# Patient Record
Sex: Female | Born: 1935 | Race: White | Hispanic: No | Marital: Single | Smoking: Former smoker
Health system: Southern US, Community
[De-identification: ages and names within clinical notes are randomized; demographics above are authoritative.]

## PROBLEM LIST (undated history)

## (undated) DIAGNOSIS — F32A Depression, unspecified: Secondary | ICD-10-CM

## (undated) DIAGNOSIS — I509 Heart failure, unspecified: Secondary | ICD-10-CM

## (undated) DIAGNOSIS — I10 Essential (primary) hypertension: Secondary | ICD-10-CM

## (undated) DIAGNOSIS — E079 Disorder of thyroid, unspecified: Secondary | ICD-10-CM

## (undated) DIAGNOSIS — D649 Anemia, unspecified: Secondary | ICD-10-CM

## (undated) DIAGNOSIS — E785 Hyperlipidemia, unspecified: Secondary | ICD-10-CM

## (undated) DIAGNOSIS — F329 Major depressive disorder, single episode, unspecified: Secondary | ICD-10-CM

## (undated) HISTORY — DX: Major depressive disorder, single episode, unspecified: F32.9

## (undated) HISTORY — DX: Hyperlipidemia, unspecified: E78.5

## (undated) HISTORY — DX: Heart failure, unspecified: I50.9

## (undated) HISTORY — DX: Depression, unspecified: F32.A

## (undated) HISTORY — DX: Anemia, unspecified: D64.9

## (undated) HISTORY — DX: Disorder of thyroid, unspecified: E07.9

---

## 2004-08-12 ENCOUNTER — Inpatient Hospital Stay (HOSPITAL_COMMUNITY): Admission: AD | Admit: 2004-08-12 | Discharge: 2004-08-20 | Payer: Self-pay | Admitting: *Deleted

## 2004-08-12 ENCOUNTER — Ambulatory Visit: Payer: Self-pay | Admitting: Physical Medicine & Rehabilitation

## 2004-08-20 ENCOUNTER — Inpatient Hospital Stay
Admission: RE | Admit: 2004-08-20 | Discharge: 2004-08-28 | Payer: Self-pay | Admitting: Physical Medicine & Rehabilitation

## 2004-08-31 ENCOUNTER — Inpatient Hospital Stay (HOSPITAL_COMMUNITY): Admission: EM | Admit: 2004-08-31 | Discharge: 2004-09-05 | Payer: Self-pay | Admitting: Emergency Medicine

## 2004-09-18 ENCOUNTER — Encounter: Admission: RE | Admit: 2004-09-18 | Discharge: 2004-09-18 | Payer: Self-pay | Admitting: Cardiothoracic Surgery

## 2004-10-20 ENCOUNTER — Encounter
Admission: RE | Admit: 2004-10-20 | Discharge: 2004-10-20 | Payer: Self-pay | Admitting: Thoracic Surgery (Cardiothoracic Vascular Surgery)

## 2005-01-13 ENCOUNTER — Inpatient Hospital Stay (HOSPITAL_COMMUNITY): Admission: RE | Admit: 2005-01-13 | Discharge: 2005-01-20 | Payer: Self-pay | Admitting: Orthopedic Surgery

## 2005-01-13 ENCOUNTER — Ambulatory Visit: Payer: Self-pay | Admitting: Physical Medicine & Rehabilitation

## 2005-01-20 ENCOUNTER — Inpatient Hospital Stay
Admission: RE | Admit: 2005-01-20 | Discharge: 2005-02-05 | Payer: Self-pay | Admitting: Physical Medicine & Rehabilitation

## 2011-06-28 ENCOUNTER — Ambulatory Visit (INDEPENDENT_AMBULATORY_CARE_PROVIDER_SITE_OTHER): Payer: Medicare Other | Admitting: Family Medicine

## 2011-06-28 ENCOUNTER — Encounter: Payer: Self-pay | Admitting: Family Medicine

## 2011-06-28 VITALS — BP 120/80 | HR 71 | Temp 98.0°F | Resp 20 | Ht 63.0 in | Wt 191.0 lb

## 2011-06-28 DIAGNOSIS — E039 Hypothyroidism, unspecified: Secondary | ICD-10-CM

## 2011-06-28 DIAGNOSIS — I10 Essential (primary) hypertension: Secondary | ICD-10-CM

## 2011-06-28 DIAGNOSIS — F329 Major depressive disorder, single episode, unspecified: Secondary | ICD-10-CM

## 2011-06-28 DIAGNOSIS — Z23 Encounter for immunization: Secondary | ICD-10-CM

## 2011-06-28 DIAGNOSIS — M1990 Unspecified osteoarthritis, unspecified site: Secondary | ICD-10-CM

## 2011-06-28 DIAGNOSIS — E785 Hyperlipidemia, unspecified: Secondary | ICD-10-CM

## 2011-06-28 LAB — LIPID PANEL
Cholesterol: 178 mg/dL (ref 0–200)
HDL: 49 mg/dL (ref >39)
LDL Cholesterol: 103 mg/dL — ABNORMAL HIGH (ref 0–99)
Total CHOL/HDL Ratio: 3.6 Ratio
Triglycerides: 132 mg/dL (ref <150)

## 2011-06-28 LAB — COMPREHENSIVE METABOLIC PANEL
ALT: 10 U/L (ref 0–35)
AST: 15 U/L (ref 0–37)
Albumin: 4.1 g/dL (ref 3.5–5.2)
Alkaline Phosphatase: 83 U/L (ref 39–117)
CO2: 28 mEq/L (ref 19–32)
Calcium: 9.8 mg/dL (ref 8.4–10.5)
Creat: 1.14 mg/dL — ABNORMAL HIGH (ref 0.50–1.10)
Glucose, Bld: 132 mg/dL — ABNORMAL HIGH (ref 70–99)
Potassium: 4.5 mEq/L (ref 3.5–5.3)
Sodium: 139 mEq/L (ref 135–145)
Total Bilirubin: 0.3 mg/dL (ref 0.3–1.2)
Total Protein: 7.6 g/dL (ref 6.0–8.3)

## 2011-06-28 MED ORDER — DULOXETINE HCL 60 MG PO CPEP: 60.0000 mg | ORAL_CAPSULE | Freq: Every day | ORAL | Status: DC

## 2011-06-28 MED ORDER — LISINOPRIL 5 MG PO TABS: 5.0000 mg | ORAL_TABLET | Freq: Every day | ORAL | Status: DC

## 2011-06-28 MED ORDER — LEVOTHYROXINE SODIUM 50 MCG PO TABS: 50.0000 ug | ORAL_TABLET | Freq: Every day | ORAL | Status: DC

## 2011-06-28 MED ORDER — BUPROPION HCL ER (XL) 150 MG PO TB24: 150.0000 mg | ORAL_TABLET | Freq: Every day | ORAL | Status: DC

## 2011-06-28 MED ORDER — METOPROLOL TARTRATE 50 MG PO TABS: 50.0000 mg | ORAL_TABLET | Freq: Two times a day (BID) | ORAL | Status: DC

## 2011-06-28 MED ORDER — SIMVASTATIN 40 MG PO TABS: 40.0000 mg | ORAL_TABLET | Freq: Every evening | ORAL | Status: DC

## 2011-06-28 NOTE — Progress Notes (Signed)
  Subjective:    Patient ID: Jasmine Newman, female    DOB: 11-15-1931, 78 y.o.   MRN: [id number]  HPI  Patient presents with her husband in follow up of her multiple medical problems.  Compliant with medications without side effects. Needing medications refills and BW.  DJD (R) knee;  TKR (L) knee; pain limits ability to ambulate; using walker for balance and stability.  Married; husband assists with IADL's  Patient declines mammogram, colonoscopy and all other health screening tests. Agrees to update vaccinations  Review of Systems     Objective:   Physical Exam  Constitutional: She appears well-developed.  HENT:  Right Ear: External ear normal.  Left Ear: External ear normal.  Nose: Nose normal.  Mouth/Throat: Oropharynx is clear and moist.  Eyes: EOM are normal. Pupils are equal, round, and reactive to light.  Neck: No thyromegaly present.  Cardiovascular: Normal rate, regular rhythm and normal heart sounds.   Pulmonary/Chest: Effort normal and breath sounds normal.  Abdominal: Soft. She exhibits no mass. There is no hepatosplenomegaly. There is no tenderness.  Neurological: She is alert.  Skin: Skin is warm.        Assessment & Plan:   1. Need for vaccination  Tdap vaccine greater than or equal to 7yo IM  2. HTN (hypertension)  Comprehensive metabolic panel, metoprolol (LOPRESSOR) 50 MG tablet, lisinopril (PRINIVIL,ZESTRIL) 5 MG tablet  3. Dyslipidemia  Lipid panel, simvastatin (ZOCOR) 40 MG tablet  4. DJD (degenerative joint disease)    5. Depression  DULoxetine (CYMBALTA) 60 MG capsule, buPROPion (WELLBUTRIN XL) 150 MG 24 hr tablet  6. Hypothyroid  TSH, levothyroxine (SYNTHROID, LEVOTHROID) 50 MCG tablet   Anticipatory guidance

## 2015-06-01 ENCOUNTER — Inpatient Hospital Stay (HOSPITAL_COMMUNITY)
Admission: EM | Admit: 2015-06-01 | Discharge: 2015-06-20 | DRG: 853 | Disposition: A | Discharge status: Skilled Nursing Facility | Payer: Medicare Other | Attending: Internal Medicine | Admitting: Internal Medicine

## 2015-06-01 ENCOUNTER — Emergency Department (HOSPITAL_COMMUNITY): Payer: Medicare Other

## 2015-06-01 ENCOUNTER — Encounter (HOSPITAL_COMMUNITY): Payer: Self-pay

## 2015-06-01 DIAGNOSIS — Z87891 Personal history of nicotine dependence: Secondary | ICD-10-CM

## 2015-06-01 DIAGNOSIS — Z978 Presence of other specified devices: Secondary | ICD-10-CM

## 2015-06-01 DIAGNOSIS — Y92009 Unspecified place in unspecified non-institutional (private) residence as the place of occurrence of the external cause: Secondary | ICD-10-CM

## 2015-06-01 DIAGNOSIS — E440 Moderate protein-calorie malnutrition: Secondary | ICD-10-CM | POA: Diagnosis present

## 2015-06-01 DIAGNOSIS — E860 Dehydration: Secondary | ICD-10-CM

## 2015-06-01 DIAGNOSIS — E669 Obesity, unspecified: Secondary | ICD-10-CM | POA: Diagnosis present

## 2015-06-01 DIAGNOSIS — T826XXA Infection and inflammatory reaction due to cardiac valve prosthesis, initial encounter: Secondary | ICD-10-CM | POA: Diagnosis present

## 2015-06-01 DIAGNOSIS — D62 Acute posthemorrhagic anemia: Secondary | ICD-10-CM | POA: Diagnosis present

## 2015-06-01 DIAGNOSIS — Y831 Surgical operation with implant of artificial internal device as the cause of abnormal reaction of the patient, or of later complication, without mention of misadventure at the time of the procedure: Secondary | ICD-10-CM | POA: Diagnosis present

## 2015-06-01 DIAGNOSIS — E162 Hypoglycemia, unspecified: Secondary | ICD-10-CM | POA: Diagnosis not present

## 2015-06-01 DIAGNOSIS — I339 Acute and subacute endocarditis, unspecified: Secondary | ICD-10-CM

## 2015-06-01 DIAGNOSIS — K922 Gastrointestinal hemorrhage, unspecified: Secondary | ICD-10-CM | POA: Diagnosis present

## 2015-06-01 DIAGNOSIS — E039 Hypothyroidism, unspecified: Secondary | ICD-10-CM | POA: Diagnosis present

## 2015-06-01 DIAGNOSIS — B9689 Other specified bacterial agents as the cause of diseases classified elsewhere: Secondary | ICD-10-CM | POA: Diagnosis not present

## 2015-06-01 DIAGNOSIS — A419 Sepsis, unspecified organism: Secondary | ICD-10-CM | POA: Diagnosis not present

## 2015-06-01 DIAGNOSIS — I080 Rheumatic disorders of both mitral and aortic valves: Secondary | ICD-10-CM | POA: Diagnosis present

## 2015-06-01 DIAGNOSIS — T502X5A Adverse effect of carbonic-anhydrase inhibitors, benzothiadiazides and other diuretics, initial encounter: Secondary | ICD-10-CM | POA: Diagnosis not present

## 2015-06-01 DIAGNOSIS — N390 Urinary tract infection, site not specified: Secondary | ICD-10-CM | POA: Diagnosis present

## 2015-06-01 DIAGNOSIS — K264 Chronic or unspecified duodenal ulcer with hemorrhage: Secondary | ICD-10-CM | POA: Diagnosis present

## 2015-06-01 DIAGNOSIS — K260 Acute duodenal ulcer with hemorrhage: Secondary | ICD-10-CM | POA: Diagnosis not present

## 2015-06-01 DIAGNOSIS — J9601 Acute respiratory failure with hypoxia: Secondary | ICD-10-CM | POA: Diagnosis not present

## 2015-06-01 DIAGNOSIS — I214 Non-ST elevation (NSTEMI) myocardial infarction: Secondary | ICD-10-CM | POA: Diagnosis present

## 2015-06-01 DIAGNOSIS — E876 Hypokalemia: Secondary | ICD-10-CM | POA: Diagnosis present

## 2015-06-01 DIAGNOSIS — K921 Melena: Secondary | ICD-10-CM

## 2015-06-01 DIAGNOSIS — N179 Acute kidney failure, unspecified: Secondary | ICD-10-CM | POA: Diagnosis present

## 2015-06-01 DIAGNOSIS — I5033 Acute on chronic diastolic (congestive) heart failure: Secondary | ICD-10-CM

## 2015-06-01 DIAGNOSIS — A4181 Sepsis due to Enterococcus: Principal | ICD-10-CM | POA: Diagnosis present

## 2015-06-01 DIAGNOSIS — W19XXXA Unspecified fall, initial encounter: Secondary | ICD-10-CM | POA: Insufficient documentation

## 2015-06-01 DIAGNOSIS — I249 Acute ischemic heart disease, unspecified: Secondary | ICD-10-CM | POA: Diagnosis not present

## 2015-06-01 DIAGNOSIS — I4891 Unspecified atrial fibrillation: Secondary | ICD-10-CM | POA: Diagnosis present

## 2015-06-01 DIAGNOSIS — Z66 Do not resuscitate: Secondary | ICD-10-CM | POA: Diagnosis present

## 2015-06-01 DIAGNOSIS — J449 Chronic obstructive pulmonary disease, unspecified: Secondary | ICD-10-CM | POA: Diagnosis present

## 2015-06-01 DIAGNOSIS — E785 Hyperlipidemia, unspecified: Secondary | ICD-10-CM | POA: Diagnosis present

## 2015-06-01 DIAGNOSIS — E870 Hyperosmolality and hypernatremia: Secondary | ICD-10-CM | POA: Diagnosis present

## 2015-06-01 DIAGNOSIS — R0602 Shortness of breath: Secondary | ICD-10-CM

## 2015-06-01 DIAGNOSIS — R9389 Abnormal findings on diagnostic imaging of other specified body structures: Secondary | ICD-10-CM

## 2015-06-01 DIAGNOSIS — Z951 Presence of aortocoronary bypass graft: Secondary | ICD-10-CM

## 2015-06-01 DIAGNOSIS — I110 Hypertensive heart disease with heart failure: Secondary | ICD-10-CM | POA: Diagnosis present

## 2015-06-01 DIAGNOSIS — L89623 Pressure ulcer of left heel, stage 3: Secondary | ICD-10-CM | POA: Diagnosis present

## 2015-06-01 DIAGNOSIS — B952 Enterococcus as the cause of diseases classified elsewhere: Secondary | ICD-10-CM | POA: Diagnosis not present

## 2015-06-01 DIAGNOSIS — R7881 Bacteremia: Secondary | ICD-10-CM

## 2015-06-01 DIAGNOSIS — Z6834 Body mass index (BMI) 34.0-34.9, adult: Secondary | ICD-10-CM | POA: Diagnosis not present

## 2015-06-01 DIAGNOSIS — R579 Shock, unspecified: Secondary | ICD-10-CM | POA: Diagnosis not present

## 2015-06-01 DIAGNOSIS — R571 Hypovolemic shock: Secondary | ICD-10-CM | POA: Insufficient documentation

## 2015-06-01 DIAGNOSIS — I340 Nonrheumatic mitral (valve) insufficiency: Secondary | ICD-10-CM | POA: Diagnosis not present

## 2015-06-01 DIAGNOSIS — Z9889 Other specified postprocedural states: Secondary | ICD-10-CM | POA: Diagnosis not present

## 2015-06-01 DIAGNOSIS — I330 Acute and subacute infective endocarditis: Secondary | ICD-10-CM | POA: Diagnosis present

## 2015-06-01 DIAGNOSIS — L8990 Pressure ulcer of unspecified site, unspecified stage: Secondary | ICD-10-CM | POA: Insufficient documentation

## 2015-06-01 DIAGNOSIS — D696 Thrombocytopenia, unspecified: Secondary | ICD-10-CM | POA: Diagnosis present

## 2015-06-01 DIAGNOSIS — Z952 Presence of prosthetic heart valve: Secondary | ICD-10-CM | POA: Diagnosis not present

## 2015-06-01 DIAGNOSIS — Z9289 Personal history of other medical treatment: Secondary | ICD-10-CM

## 2015-06-01 DIAGNOSIS — D6489 Other specified anemias: Secondary | ICD-10-CM | POA: Diagnosis not present

## 2015-06-01 HISTORY — DX: Essential (primary) hypertension: I10

## 2015-06-01 LAB — GLUCOSE, CAPILLARY
GLUCOSE-CAPILLARY: 88 mg/dL (ref 65–99)
GLUCOSE-CAPILLARY: 112 mg/dL — AB (ref 65–99)

## 2015-06-01 LAB — CBC
HCT: 24.2 % — ABNORMAL LOW (ref 36.0–46.0)
Hemoglobin: 8.2 g/dL — ABNORMAL LOW (ref 12.0–15.0)
MCH: 26.6 pg (ref 26.0–34.0)
MCHC: 33.9 g/dL (ref 30.0–36.0)
MCV: 78.6 fL (ref 78.0–100.0)
Platelets: 206 10*3/uL (ref 150–400)
RBC: 3.08 MIL/uL — ABNORMAL LOW (ref 3.87–5.11)
RDW: 18.5 % — AB (ref 11.5–15.5)
WBC: 10.4 10*3/uL (ref 4.0–10.5)

## 2015-06-01 LAB — BRAIN NATRIURETIC PEPTIDE: B Natriuretic Peptide: 329.7 pg/mL — ABNORMAL HIGH (ref 0.0–100.0)

## 2015-06-01 LAB — COMPREHENSIVE METABOLIC PANEL
ALK PHOS: 64 U/L (ref 38–126)
ALT: 19 U/L (ref 14–54)
AST: 25 U/L (ref 15–41)
Albumin: 2.4 g/dL — ABNORMAL LOW (ref 3.5–5.0)
Anion gap: 9 (ref 5–15)
BUN: 60 mg/dL — ABNORMAL HIGH (ref 6–20)
CALCIUM: 8.4 mg/dL — AB (ref 8.9–10.3)
CO2: 25 mmol/L (ref 22–32)
Chloride: 106 mmol/L (ref 101–111)
Creatinine, Ser: 2.5 mg/dL — ABNORMAL HIGH (ref 0.44–1.00)
GFR calc Af Amer: 20 mL/min — ABNORMAL LOW (ref >60)
GFR calc non Af Amer: 17 mL/min — ABNORMAL LOW (ref >60)
Glucose, Bld: 112 mg/dL — ABNORMAL HIGH (ref 65–99)
Potassium: 4.5 mmol/L (ref 3.5–5.1)
SODIUM: 140 mmol/L (ref 135–145)
Total Bilirubin: 0.5 mg/dL (ref 0.3–1.2)
Total Protein: 6.1 g/dL — ABNORMAL LOW (ref 6.5–8.1)

## 2015-06-01 LAB — AMYLASE: Amylase: 44 U/L (ref 28–100)

## 2015-06-01 LAB — CBC WITH DIFFERENTIAL/PLATELET
Basophils Absolute: 0 10*3/uL (ref 0.0–0.1)
Basophils Relative: 0 %
EOS ABS: 0 10*3/uL (ref 0.0–0.7)
Eosinophils Relative: 0 %
HCT: 23.3 % — ABNORMAL LOW (ref 36.0–46.0)
HEMOGLOBIN: 7.4 g/dL — AB (ref 12.0–15.0)
LYMPHS ABS: 1.3 10*3/uL (ref 0.7–4.0)
Lymphocytes Relative: 12 %
MCH: 24.3 pg — AB (ref 26.0–34.0)
MCHC: 31.8 g/dL (ref 30.0–36.0)
MCV: 76.4 fL — ABNORMAL LOW (ref 78.0–100.0)
Monocytes Absolute: 0.8 10*3/uL (ref 0.1–1.0)
Monocytes Relative: 7 %
NEUTROS PCT: 81 %
Neutro Abs: 9 10*3/uL — ABNORMAL HIGH (ref 1.7–7.7)
Platelets: 215 10*3/uL (ref 150–400)
RBC: 3.05 MIL/uL — AB (ref 3.87–5.11)
RDW: 19.3 % — ABNORMAL HIGH (ref 11.5–15.5)
WBC: 11.1 10*3/uL — AB (ref 4.0–10.5)

## 2015-06-01 LAB — URINALYSIS, ROUTINE W REFLEX MICROSCOPIC
BILIRUBIN URINE: NEGATIVE
Glucose, UA: NEGATIVE mg/dL
KETONES UR: NEGATIVE mg/dL
NITRITE: NEGATIVE
PH: 5 (ref 5.0–8.0)
Protein, ur: 30 mg/dL — AB
Specific Gravity, Urine: 1.016 (ref 1.005–1.030)

## 2015-06-01 LAB — PROCALCITONIN: Procalcitonin: 0.63 ng/mL

## 2015-06-01 LAB — LACTIC ACID, PLASMA: Lactic Acid, Venous: 0.7 mmol/L (ref 0.5–2.0)

## 2015-06-01 LAB — HEMOGLOBIN AND HEMATOCRIT, BLOOD
HCT: 25.6 % — ABNORMAL LOW (ref 36.0–46.0)
Hemoglobin: 8.5 g/dL — ABNORMAL LOW (ref 12.0–15.0)

## 2015-06-01 LAB — I-STAT CG4 LACTIC ACID, ED: LACTIC ACID, VENOUS: 0.87 mmol/L (ref 0.5–2.0)

## 2015-06-01 LAB — I-STAT TROPONIN, ED: TROPONIN I, POC: 0.04 ng/mL (ref 0.00–0.08)

## 2015-06-01 LAB — PROTIME-INR
INR: 1.07 (ref 0.00–1.49)
Prothrombin Time: 14.1 seconds (ref 11.6–15.2)

## 2015-06-01 LAB — TROPONIN I: Troponin I: 0.9 ng/mL (ref <0.031)

## 2015-06-01 LAB — URINE MICROSCOPIC-ADD ON
RBC / HPF: NONE SEEN RBC/hpf (ref 0–5)
Squamous Epithelial / LPF: NONE SEEN

## 2015-06-01 LAB — POC OCCULT BLOOD, ED: Fecal Occult Bld: POSITIVE — AB

## 2015-06-01 LAB — APTT: aPTT: 27 seconds (ref 24–37)

## 2015-06-01 LAB — ABO/RH: ABO/RH(D): O POS

## 2015-06-01 LAB — CK: Total CK: 323 U/L — ABNORMAL HIGH (ref 38–234)

## 2015-06-01 LAB — PREPARE RBC (CROSSMATCH)

## 2015-06-01 LAB — LIPASE, BLOOD: Lipase: 36 U/L (ref 11–51)

## 2015-06-01 LAB — MRSA PCR SCREENING: MRSA by PCR: NEGATIVE

## 2015-06-01 MED ORDER — SODIUM CHLORIDE 0.9 % IV SOLN: 80.0000 mg | Freq: Once | INTRAVENOUS | Status: DC

## 2015-06-01 MED ORDER — PANTOPRAZOLE SODIUM 40 MG IV SOLR
40.0000 mg | Freq: Two times a day (BID) | INTRAVENOUS | Status: DC
  Administered 2015-06-04: 40 mg via INTRAVENOUS
  Filled 2015-06-01 (×2): qty 40

## 2015-06-01 MED ORDER — SODIUM CHLORIDE 0.9 % IV SOLN
8.0000 mg/h | INTRAVENOUS | Status: AC
  Administered 2015-06-01 – 2015-06-04 (×7): 8 mg/h via INTRAVENOUS
  Filled 2015-06-01 (×15): qty 80

## 2015-06-01 MED ORDER — SODIUM CHLORIDE 0.9 % IV SOLN
80.0000 mg | Freq: Once | INTRAVENOUS | Status: AC
  Administered 2015-06-01: 80 mg via INTRAVENOUS
  Filled 2015-06-01: qty 80

## 2015-06-01 MED ORDER — SODIUM CHLORIDE 0.9 % IV BOLUS (SEPSIS)
1000.0000 mL | Freq: Once | INTRAVENOUS | Status: AC
  Administered 2015-06-01: 1000 mL via INTRAVENOUS

## 2015-06-01 MED ORDER — PANTOPRAZOLE SODIUM 40 MG IV SOLR: 40.0000 mg | Freq: Two times a day (BID) | INTRAVENOUS | Status: DC

## 2015-06-01 MED ORDER — SODIUM CHLORIDE 0.9 % IV SOLN
1.5000 g | Freq: Once | INTRAVENOUS | Status: AC
  Administered 2015-06-01: 1.5 g via INTRAVENOUS
  Filled 2015-06-01: qty 1.5

## 2015-06-01 MED ORDER — SODIUM CHLORIDE 0.9 % IV SOLN
10.0000 mL/h | Freq: Once | INTRAVENOUS | Status: AC
  Administered 2015-06-02: 10 mL/h via INTRAVENOUS

## 2015-06-01 MED ORDER — SODIUM CHLORIDE 0.9 % IV SOLN
INTRAVENOUS | Status: DC
  Administered 2015-06-01 – 2015-06-04 (×5): via INTRAVENOUS
  Administered 2015-06-05: 1000 mL via INTRAVENOUS
  Administered 2015-06-05 – 2015-06-07 (×2): via INTRAVENOUS

## 2015-06-01 MED ORDER — FENTANYL CITRATE (PF) 100 MCG/2ML IJ SOLN: 12.5000 ug | INTRAMUSCULAR | Status: DC | PRN

## 2015-06-01 MED ORDER — INSULIN ASPART 100 UNIT/ML ~~LOC~~ SOLN
0.0000 [IU] | SUBCUTANEOUS | Status: DC
  Administered 2015-06-02: 1 [IU] via SUBCUTANEOUS
  Administered 2015-06-02: 2 [IU] via SUBCUTANEOUS
  Administered 2015-06-02 (×2): 1 [IU] via SUBCUTANEOUS

## 2015-06-01 MED ORDER — SODIUM CHLORIDE 0.9 % IV SOLN
1.5000 g | Freq: Two times a day (BID) | INTRAVENOUS | Status: DC
  Administered 2015-06-02: 1.5 g via INTRAVENOUS
  Filled 2015-06-01: qty 1.5

## 2015-06-01 MED ORDER — SODIUM CHLORIDE 0.9 % IV SOLN: 8.0000 mg/h | INTRAVENOUS | Status: DC

## 2015-06-01 MED ORDER — LEVOTHYROXINE SODIUM 100 MCG IV SOLR
25.0000 ug | Freq: Every day | INTRAVENOUS | Status: DC
  Administered 2015-06-01 – 2015-06-12 (×11): 25 ug via INTRAVENOUS
  Filled 2015-06-01 (×14): qty 5

## 2015-06-01 MED ORDER — NOREPINEPHRINE BITARTRATE 1 MG/ML IV SOLN
0.0000 ug/min | INTRAVENOUS | Status: DC
  Administered 2015-06-01: 8 ug/min via INTRAVENOUS
  Administered 2015-06-02: 10 ug/min via INTRAVENOUS
  Filled 2015-06-01 (×2): qty 4

## 2015-06-01 MED ORDER — SODIUM CHLORIDE 0.9 % IV SOLN
250.0000 mL | INTRAVENOUS | Status: DC | PRN
  Administered 2015-06-08: 250 mL via INTRAVENOUS

## 2015-06-01 NOTE — ED Notes (Signed)
Pt presents via EMS post fall last PM. Apparently witness by husband but nothing done to get pt out of floor. EMS reports pt laying in coffee ground stool and melena. EMS reported pt to be hypotensive. BP low upon assessment. Pt appears to be pale. Reports pain to bilateral legs.

## 2015-06-01 NOTE — Procedures (Signed)
Central Venous Catheter Insertion Procedure Note Jasmine Newman [id number] April 05, 1931  Procedure: Insertion of Central Venous Catheter Indications: Drug and/or fluid administration  Procedure Details Consent: Risks of procedure as well as the alternatives and risks of each were explained to the (patient/caregiver).  Consent for procedure obtained. Time Out: Verified patient identification, verified procedure, site/side was marked, verified correct patient position, special equipment/implants available, medications/allergies/relevent history reviewed, required imaging and test results available.  Performed  Maximum sterile technique was used including antiseptics, cap, gloves, gown, hand hygiene, mask and sheet. Skin prep: Chlorhexidine; local anesthetic administered A antimicrobial bonded/coated triple lumen catheter was placed in the right internal jugular vein using the Seldinger technique.  Evaluation Blood flow good Complications: No apparent complications Patient did tolerate procedure well. Chest X-ray ordered to verify placement.  CXR: pending.  Kendan Cornforth 06/01/2015, 3:42 PM

## 2015-06-01 NOTE — ED Provider Notes (Signed)
CSN: [id number]     Arrival date & time 06/01/15  1006 History   First MD Initiated Contact with Patient 06/01/15 1032     Chief Complaint  Patient presents with  . Hypotension  . Fall     (Consider location/radiation/quality/duration/timing/severity/associated sxs/prior Treatment) The history is provided by the patient and medical records.   82 year old female with history of hypothyroidism, hypertension, hyperlipidemia, depression, presenting to the ED after a fall. Patient reports last night she is watching a basketball game with her husband, attempted to stand up from her chair to go use the bathroom but her legs "gave out" and she fell onto her right side on carpeted floor.  Denies head injury or loss of consciousness.  She states her husband left her lying in the floor all night, and her son came to help her up this morning and called EMS. Paramedics report patient was found in a large puddle of melanotic stool.  Patient does admit she has had some dark recently. She denies any history of GI bleeding. She is not currently on any anticoagulation. Patient was hypotensive on EMS arrival. EMS also reports that patient has been refusing to go to the doctor so husband has been giving her his medications.  Patient is not entirely sure exactly what medications she has been taking or what her husbands medications are.  She denies current chest pain, SOB, abdominal pain, nausea, or vomiting.  No recent fever or chills.  Patient persistently hypotensive on arrival to ED.  No past medical history on file. No past surgical history on file. No family history on file. Social History  Substance Use Topics  . Smoking status: Former Games developermoker  . Smokeless tobacco: Not on file  . Alcohol Use: Not on file   OB History    No data available     Review of Systems  Gastrointestinal: Positive for blood in stool.  Musculoskeletal: Positive for arthralgias.  All other systems reviewed and are  negative.     Allergies  Review of patient's allergies indicates not on file.  Home Medications   Prior to Admission medications   Medication Sig Start Date End Date Taking? Authorizing Provider  buPROPion (WELLBUTRIN XL) 150 MG 24 hr tablet Take 1 tablet (150 mg total) by mouth daily. 06/28/11   Dois DavenportKaren L Richter, MD  DULoxetine (CYMBALTA) 60 MG capsule Take 1 capsule (60 mg total) by mouth daily. 06/28/11   Dois DavenportKaren L Richter, MD  levothyroxine (SYNTHROID, LEVOTHROID) 50 MCG tablet Take 1 tablet (50 mcg total) by mouth daily. 06/28/11   Dois DavenportKaren L Richter, MD  lisinopril (PRINIVIL,ZESTRIL) 5 MG tablet Take 1 tablet (5 mg total) by mouth daily. 06/28/11   Dois DavenportKaren L Richter, MD  metoprolol (LOPRESSOR) 50 MG tablet Take 1 tablet (50 mg total) by mouth 2 (two) times daily. 06/28/11   Dois DavenportKaren L Richter, MD  simvastatin (ZOCOR) 40 MG tablet Take 1 tablet (40 mg total) by mouth every evening. 06/28/11   Dois DavenportKaren L Richter, MD   BP 77/52 mmHg  Pulse 78  Temp(Src) 97.6 F (36.4 C) (Axillary)  Resp 16  Ht 5\' 2"  (1.575 m)  Wt 86.183 kg  BMI 34.74 kg/m2  SpO2 98%  LMP  (LMP Unknown)   Physical Exam  Constitutional: She is oriented to person, place, and time. She appears well-developed and well-nourished.  Appears pale, weak  HENT:  Head: Normocephalic and atraumatic.  Mouth/Throat: Oropharynx is clear and moist.  No signs of head trauma  Eyes: Conjunctivae  and EOM are normal. Pupils are equal, round, and reactive to light.  Conjunctiva pale, pupils symmetric and reactive  Neck: Normal range of motion.  Cardiovascular: Normal rate, regular rhythm and normal heart sounds.   Pulmonary/Chest: Effort normal and breath sounds normal. She has no wheezes. She has no rhonchi.  Bruising noted to right anterior and lateral ribs; locally tender without significant deformity; no crepitus; no signs of distress  Abdominal: Soft. Bowel sounds are normal.  Genitourinary:  Gross, loose melanotic stool per rectum   Musculoskeletal: Normal range of motion.  Right hip minimally tender, no significant leg shortening appreciated; legs with poor muscle tone bilaterally (baseline); DP pulses intact; able to move all toes normally  Neurological: She is alert and oriented to person, place, and time.  Awake, alert, answering questions and following commands appropriately; moving all extremities well Gait not tested  Skin: Skin is warm and dry.  Psychiatric: She has a normal mood and affect.  Nursing note and vitals reviewed.   ED Course  Procedures (including critical care time)  CRITICAL CARE Performed by: Garlon Hatchet   Total critical care time: 75 minutes  Critical care time was exclusive of separately billable procedures and treating other patients.  Critical care was necessary to treat or prevent imminent or life-threatening deterioration.  Critical care was time spent personally by me on the following activities: development of treatment plan with patient and/or surrogate as well as nursing, discussions with consultants, evaluation of patient's response to treatment, examination of patient, obtaining history from patient or surrogate, ordering and performing treatments and interventions, ordering and review of laboratory studies, ordering and review of radiographic studies, pulse oximetry and re-evaluation of patient's condition.  Medications  pantoprazole (PROTONIX) 80 mg in sodium chloride 0.9 % 250 mL (0.32 mg/mL) infusion (8 mg/hr Intravenous New Bag/Given 06/01/15 1232)  pantoprazole (PROTONIX) injection 40 mg (not administered)  0.9 %  sodium chloride infusion (not administered)  0.9 %  sodium chloride infusion (not administered)  sodium chloride 0.9 % bolus 1,000 mL (1,000 mLs Intravenous New Bag/Given 06/01/15 1610)  0.9 %  sodium chloride infusion (not administered)  levothyroxine (SYNTHROID, LEVOTHROID) injection 25 mcg (not administered)  insulin aspart (novoLOG) injection 0-9 Units  (not administered)  ampicillin-sulbactam (UNASYN) 1.5 g in sodium chloride 0.9 % 50 mL IVPB (not administered)  ampicillin-sulbactam (UNASYN) 1.5 g in sodium chloride 0.9 % 50 mL IVPB (not administered)  sodium chloride 0.9 % bolus 1,000 mL (0 mLs Intravenous Stopped 06/01/15 1230)  pantoprazole (PROTONIX) 80 mg in sodium chloride 0.9 % 100 mL IVPB (0 mg Intravenous Stopped 06/01/15 1231)  sodium chloride 0.9 % bolus 1,000 mL (0 mLs Intravenous Stopped 06/01/15 1345)   Labs Review Labs Reviewed  CBC WITH DIFFERENTIAL/PLATELET - Abnormal; Notable for the following:    WBC 11.1 (*)    RBC 3.05 (*)    Hemoglobin 7.4 (*)    HCT 23.3 (*)    MCV 76.4 (*)    MCH 24.3 (*)    RDW 19.3 (*)    Neutro Abs 9.0 (*)    All other components within normal limits  COMPREHENSIVE METABOLIC PANEL - Abnormal; Notable for the following:    Glucose, Bld 112 (*)    BUN 60 (*)    Creatinine, Ser 2.50 (*)    Calcium 8.4 (*)    Total Protein 6.1 (*)    Albumin 2.4 (*)    GFR calc non Af Amer 17 (*)    GFR calc  Af Amer 20 (*)    All other components within normal limits  CK - Abnormal; Notable for the following:    Total CK 323 (*)    All other components within normal limits  URINALYSIS, ROUTINE W REFLEX MICROSCOPIC (NOT AT [facility]) - Abnormal; Notable for the following:    APPearance CLOUDY (*)    Hgb urine dipstick MODERATE (*)    Protein, ur 30 (*)    Leukocytes, UA TRACE (*)    All other components within normal limits  URINE MICROSCOPIC-ADD ON - Abnormal; Notable for the following:    Bacteria, UA RARE (*)    Casts HYALINE CASTS (*)    All other components within normal limits  POC OCCULT BLOOD, ED - Abnormal; Notable for the following:    Fecal Occult Bld POSITIVE (*)    All other components within normal limits  URINE CULTURE  PROTIME-INR  APTT  OCCULT BLOOD X 1 CARD TO LAB, STOOL  I-STAT TROPOININ, ED  TYPE AND SCREEN  PREPARE RBC (CROSSMATCH)    Imaging Review Dg Ribs Unilateral W/chest  Right  06/01/2015  CLINICAL DATA:  Right rib pain after fall last night at home. EXAM: RIGHT RIBS AND CHEST - 3+ VIEW COMPARISON:  January 08, 2005. FINDINGS: No fracture or other bone lesions are seen involving the ribs. There is no evidence of pneumothorax or pleural effusion. Both lungs are clear. Heart size and mediastinal contours are within normal limits. IMPRESSION: Normal right ribs.  No acute cardiopulmonary abnormality seen. Electronically Signed   By: Lupita Raider, M.D.   On: 06/01/2015 11:55   Dg Hip Unilat With Pelvis 2-3 Views Right  06/01/2015  CLINICAL DATA:  Fall last night, generalized pain. EXAM: DG HIP (WITH OR WITHOUT PELVIS) 2-3V RIGHT COMPARISON:  None. FINDINGS: Single view of the pelvis and two views of the right hip are provided. There is diffuse osseous demineralization/osteopenia which limits characterization of osseous detail, however, there is no fracture line or displaced fracture fragment identified. Right femoral head appears well positioned relative to the acetabulum. Degenerative changes are seen at each hip joint, at least moderate in degree, left slightly greater than right, with associated joint space narrowing and osseous spurring. Additional degenerative change noted within the lower lumbar spine. Soft tissues about the pelvis and right hip are unremarkable. IMPRESSION: 1. No acute findings.  No osseous fracture or dislocation seen. 2. Diffuse osseous demineralization/osteopenia. 3. Degenerative changes at the bilateral hip joints, at least moderate in degree, left slightly greater than right. Electronically Signed   By: Bary Richard M.D.   On: 06/01/2015 11:52   I have personally reviewed and evaluated these images and lab results as part of my medical decision-making.   EKG Interpretation   Date/Time:  Sunday June 01 2015 11:20:29 EDT Ventricular Rate:  77 PR Interval:  155 QRS Duration: 123 QT Interval:  450 QTC Calculation: 509 R Axis:   -24 Text  Interpretation:  Sinus rhythm Left bundle branch block Confirmed by  BELFI  MD, MELANIE ([id number]) on 06/01/2015 11:25:52 AM      MDM   Final diagnoses:  Fall, initial encounter  Gastrointestinal hemorrhage with melena  Anemia due to acute blood loss  AKI (acute kidney injury) [facility])  Dehydration   82 year old female here after fall onto her right side. Follows witnessed by her husband last night, was left lying in the floor all night. EMS called by son this morning. Patient found in a pool of melanotic  stool. Patient is pale and hypotensive on arrival here. She is awake, alert, oriented. She is able to answer questions and follow commands appropriately. Patient has gross loose melanotic stool per rectum on exam.  Work-up pending including EKG, labs (CBC, CMP, trop, coags, type and screen, u/a with culture), x-rays.  IVF and protonix drip started.  Will need admission.  Patient remains hypotensive, mildly improved into the 80s systolic. Tolerating protonix drip well.  She remains awake, alert, and oriented. She continues to deny any abdominal pain. Her hemoglobin is low at 7.4, unknown baseline. No documented history of anemia. Denies seeing GI in the past, never had a colonoscopy.  No family is available at bedside currently. Patient does not sure what she would like her code status to be. She will accept blood products at this time so 2 units PRBCs have been ordered for her. I have spoken with GI, Dr. Dulce Sellar, will evaluate and provide consultation.  Critical care consulted for admission given her persistent hypotension despite aggressive fluid resuscitation.  Critical care has evaluated patient, will admit for further management.  Garlon Hatchet, PA-C 06/01/15 1629  Rolan Bucco, MD 06/07/15 0700

## 2015-06-01 NOTE — H&P (Signed)
PULMONARY / CRITICAL CARE MEDICINE   Name: Jasmine PonsBetty G Korb MRN: [id number] DOB: Aug 30, 1931    ADMISSION DATE:  06/01/2015 CONSULTATION DATE:  06/01/15  REFERRING MD:  ED  CHIEF COMPLAINT:  GI bleed, hypotension  HISTORY OF PRESENT ILLNESS:   82 year old with past mental history of hypothyroidism, hypertension, hyperlipidemia, depression. Admitted today to the ED after she had a fall at home. [facility] her [facility]. When she stood up to use the bathroom her legs gave out and she fell on the right side. She hurt her leg and chest. She did not hit her head and there was no loss of consciousness. She on the floor the whole night. When her son arrived next morning she was found to be lying in a large pool of melanotic stool. In the ED she was found to be hypertensive with hemoglobin of 7.4 (baseline unknown). She continued to be hypotensive in spite of 2 L of fluid. GI evaluated the patient. PCCM called for admission.  PAST MEDICAL HISTORY :  She  has a past medical history of Hypertension.  PAST SURGICAL HISTORY: She  has no past surgical history on file.  No Known Allergies  No current facility-administered medications on file prior to encounter.   Current Outpatient Prescriptions on File Prior to Encounter  Medication Sig  . buPROPion (WELLBUTRIN XL) 150 MG 24 hr tablet Take 1 tablet (150 mg total) by mouth daily. (Patient not taking: Reported on 06/01/2015)  . DULoxetine (CYMBALTA) 60 MG capsule Take 1 capsule (60 mg total) by mouth daily. (Patient not taking: Reported on 06/01/2015)  . levothyroxine (SYNTHROID, LEVOTHROID) 50 MCG tablet Take 1 tablet (50 mcg total) by mouth daily. (Patient not taking: Reported on 06/01/2015)  . lisinopril (PRINIVIL,ZESTRIL) 5 MG tablet Take 1 tablet (5 mg total) by mouth daily. (Patient not taking: Reported on 06/01/2015)  . metoprolol (LOPRESSOR) 50 MG tablet Take 1 tablet (50 mg total) by mouth 2 (two) times  daily. (Patient not taking: Reported on 06/01/2015)  . simvastatin (ZOCOR) 40 MG tablet Take 1 tablet (40 mg total) by mouth every evening. (Patient not taking: Reported on 06/01/2015)    FAMILY HISTORY:  Her has no family status information on file.   SOCIAL HISTORY: She  reports that she has quit smoking. She does not have any smokeless tobacco history on file.  REVIEW OF SYSTEMS:   Has right rib pain, knee pain. Denies any chest pain, palpitation. Denies any dyspnea, wheezing, cough, sputum. Denies any fevers, chills. No nausea, vomiting, diarrhea, constipation. Admits to having dark colored stools for the past several days. All other review of systems negative  SUBJECTIVE:   VITAL SIGNS: BP 81/52 mmHg  Pulse 119  Temp(Src) 95.8 F (35.4 C) (Rectal)  Resp 15  Ht 5\' 2"  (1.575 m)  Wt 190 lb (86.183 kg)  BMI 34.74 kg/m2  SpO2 97%  LMP  (LMP Unknown)  HEMODYNAMICS:    VENTILATOR SETTINGS:    INTAKE / OUTPUT:    PHYSICAL EXAMINATION: General:  Elderly woman in mild distress. Moaning in pain Neuro: Awake and alert, oriented, moves all 4 extremities. No gross focal deficits HEENT:  No JVD, thyromegaly Cardiovascular:  Tachycardia, regular. No murmurs rubs gallops Lungs:  Clear, no wheeze, crackles Abdomen:  Mild epigastric tenderness. No guarding, rigidity. Positive bowel sounds Musculoskeletal:  Intact tone and bulk Skin:  Bruises over the right knee  LABS:  BMET  Recent Labs Lab 06/01/15 1125  NA  140  K 4.5  CL 106  CO2 25  BUN 60*  CREATININE 2.50*  GLUCOSE 112*    Electrolytes  Recent Labs Lab 06/01/15 1125  CALCIUM 8.4*    CBC  Recent Labs Lab 06/01/15 1125  WBC 11.1*  HGB 7.4*  HCT 23.3*  PLT 215    Coag's  Recent Labs Lab 06/01/15 1125  APTT 27  INR 1.07    Sepsis Markers No results for input(s): LATICACIDVEN, PROCALCITON, O2SATVEN in the last 168 hours.  ABG No results for input(s): PHART, PCO2ART, PO2ART in the last  168 hours.  Liver Enzymes  Recent Labs Lab 06/01/15 1125  AST 25  ALT 19  ALKPHOS 64  BILITOT 0.5  ALBUMIN 2.4*    Cardiac Enzymes No results for input(s): TROPONINI, PROBNP in the last 168 hours.  Glucose No results for input(s): GLUCAP in the last 168 hours.  Imaging Dg Ribs Unilateral W/chest Right  06/01/2015  CLINICAL DATA:  Right rib pain after fall last night at home. EXAM: RIGHT RIBS AND CHEST - 3+ VIEW COMPARISON:  January 08, 2005. FINDINGS: No fracture or other bone lesions are seen involving the ribs. There is no evidence of pneumothorax or pleural effusion. Both lungs are clear. Heart size and mediastinal contours are within normal limits. IMPRESSION: Normal right ribs.  No acute cardiopulmonary abnormality seen. Electronically Signed   By: Lupita Raider, M.D.   On: 06/01/2015 11:55   Dg Hip Unilat With Pelvis 2-3 Views Right  06/01/2015  CLINICAL DATA:  Fall last night, generalized pain. EXAM: DG HIP (WITH OR WITHOUT PELVIS) 2-3V RIGHT COMPARISON:  None. FINDINGS: Single view of the pelvis and two views of the right hip are provided. There is diffuse osseous demineralization/osteopenia which limits characterization of osseous detail, however, there is no fracture line or displaced fracture fragment identified. Right femoral head appears well positioned relative to the acetabulum. Degenerative changes are seen at each hip joint, at least moderate in degree, left slightly greater than right, with associated joint space narrowing and osseous spurring. Additional degenerative change noted within the lower lumbar spine. Soft tissues about the pelvis and right hip are unremarkable. IMPRESSION: 1. No acute findings.  No osseous fracture or dislocation seen. 2. Diffuse osseous demineralization/osteopenia. 3. Degenerative changes at the bilateral hip joints, at least moderate in degree, left slightly greater than right. Electronically Signed   By: Bary Richard M.D.   On: 06/01/2015  11:52    STUDIES:  X ray rib 4/2 > No fractures CXR 4/2 >  CULTURES: Bcx > 4/2  ANTIBIOTICS: Unasyn 4/2 >  SIGNIFICANT EVENTS:  LINES/TUBES: Rt IJ 4/2 >  DISCUSSION: A 82 year old presenting with fall, hypertension, acute GI bleed. She probably has an upper GI source.   Also noted to be in AKI. Have concern for sepsis as she has hypotension and hypothermia. But source is unclear.   ASSESSMENT / PLAN:  PULMONARY A: Stable P:   Supplemental O2 Monitor for deterioration in ICU  CARDIOVASCULAR A:  Hypotension- Likely from GI bleed P:  Check lactic A, Troponins, BNP Hold outpt HTN meds  RENAL A:   AKI- prerenal vs ATN P:   Monitor urine output after hydration  GASTROINTESTINAL A:   Acute GIB- Likely upper source. Already evaluated by GI. May get EGD tomorrow. P:   Follow H/H after 2 units blood. Transfuse for Hb < 7  HEMATOLOGIC A:   Acute blood loss anemia P:  Transfuse and monitor  INFECTIOUS  A:   Concern for sepsis of unclear source P:   Warming blanket. Pan culture Start empiric unasyn Check procalcitonin  ENDOCRINE A:   Hypothyroidism P:   Start IV synthyroid SSI Check TSH, cortisol  NEUROLOGIC A:   Stable Pain in knee P:   Fentanyl PRN for pain Knee x ray to r/o fracture.  FAMILY  - Updates: Pt updated at bedside. She wants to be full code. - Inter-disciplinary family meet or Palliative Care meeting due by:  4/9  Chilton Greathouse MD [facility] Pager [phone] If no answer or after 3pm call: [phone] 06/01/2015, 3:32 PM

## 2015-06-01 NOTE — ED Notes (Signed)
Lab bedside for remainder of blood draw.  Prior RN drew 10mL of blood only.  This RN unable to draw off of existing IV lines, then Pt transported to XR.  This caused delay in complete blood draw.

## 2015-06-01 NOTE — Progress Notes (Signed)
Patient has edema of lower legs bilaterally with dry skin and skin with cracks present.  Patient is very painful when legs are touched.  Unable to apply SCD's to legs due to pain and swelling.  CCM doctor aware of SCD's not applied.  Legs elevated on pillows bilaterally.  Continue to monitor patient closely.  Jasmine Newman Debroah LoopArnold RN

## 2015-06-01 NOTE — ED Notes (Signed)
[employer] initiated by [employer]

## 2015-06-01 NOTE — Consult Note (Signed)
[facility] Consultation Note  Referring Provider: [facility] Primary Care Physician:  No primary care provider on file.  Reason for Consultation:  Blood in stool  HPI: Jasmine Newman is a 82 y.o. female whom we've been asked to see for evaluation of blood in stool.  She passed out yesterday, hurt her legs, xrays negative, and was found to have low Hgb.  She has noticed some black stool yesterday.  She has no abdominal pain.  Denies hematemesis or hematochezia.  Denies NSAIDs.  Denies prior GI bleeding.  Doesn't recall whether she's ever had endoscopy or colonoscopy.  She tires of my questions, "I've had too much done to me already."   Past Medical History  Diagnosis Date  . Hypertension     History reviewed. No pertinent past surgical history.  Prior to Admission medications   Medication Sig Start Date End Date Taking? Authorizing Provider  buPROPion (WELLBUTRIN XL) 150 MG 24 hr tablet Take 1 tablet (150 mg total) by mouth daily. Patient not taking: Reported on 06/01/2015 06/28/11   Dois Davenport, MD  DULoxetine (CYMBALTA) 60 MG capsule Take 1 capsule (60 mg total) by mouth daily. Patient not taking: Reported on 06/01/2015 06/28/11   Dois Davenport, MD  levothyroxine (SYNTHROID, LEVOTHROID) 50 MCG tablet Take 1 tablet (50 mcg total) by mouth daily. Patient not taking: Reported on 06/01/2015 06/28/11   Dois Davenport, MD  lisinopril (PRINIVIL,ZESTRIL) 5 MG tablet Take 1 tablet (5 mg total) by mouth daily. Patient not taking: Reported on 06/01/2015 06/28/11   Dois Davenport, MD  metoprolol (LOPRESSOR) 50 MG tablet Take 1 tablet (50 mg total) by mouth 2 (two) times daily. Patient not taking: Reported on 06/01/2015 06/28/11   Dois Davenport, MD  simvastatin (ZOCOR) 40 MG tablet Take 1 tablet (40 mg total) by mouth every evening. Patient not taking: Reported on 06/01/2015 06/28/11   Dois Davenport, MD    Current Facility-Administered Medications  Medication Dose Route  Frequency Provider Last Rate Last Dose  . 0.9 %  sodium chloride infusion  10 mL/hr Intravenous Once Garlon Hatchet, PA-C      . pantoprazole (PROTONIX) 80 mg in sodium chloride 0.9 % 250 mL (0.32 mg/mL) infusion  8 mg/hr Intravenous Continuous Garlon Hatchet, PA-C 25 mL/hr at 06/01/15 1232 8 mg/hr at 06/01/15 1232  . [START ON 06/04/2015] pantoprazole (PROTONIX) injection 40 mg  40 mg Intravenous Q12H Garlon Hatchet, PA-C       Current Outpatient Prescriptions  Medication Sig Dispense Refill  . buPROPion (WELLBUTRIN XL) 150 MG 24 hr tablet Take 1 tablet (150 mg total) by mouth daily. (Patient not taking: Reported on 06/01/2015) 90 tablet 2  . DULoxetine (CYMBALTA) 60 MG capsule Take 1 capsule (60 mg total) by mouth daily. (Patient not taking: Reported on 06/01/2015) 90 capsule 2  . levothyroxine (SYNTHROID, LEVOTHROID) 50 MCG tablet Take 1 tablet (50 mcg total) by mouth daily. (Patient not taking: Reported on 06/01/2015) 90 tablet 2  . lisinopril (PRINIVIL,ZESTRIL) 5 MG tablet Take 1 tablet (5 mg total) by mouth daily. (Patient not taking: Reported on 06/01/2015) 90 tablet 2  . metoprolol (LOPRESSOR) 50 MG tablet Take 1 tablet (50 mg total) by mouth 2 (two) times daily. (Patient not taking: Reported on 06/01/2015) 180 tablet 2  . simvastatin (ZOCOR) 40 MG tablet Take 1 tablet (40 mg total) by mouth every evening. (Patient not taking: Reported on 06/01/2015) 90 tablet 2    Allergies as  of 06/01/2015  . (No Known Allergies)    History reviewed. No pertinent family history.  Social History   Social History  . Marital Status: Single    Spouse Name: N/A  . Number of Children: N/A  . Years of Education: N/A   Occupational History  . Not on file.   Social History Main Topics  . Smoking status: Former Games developer  . Smokeless tobacco: Not on file  . Alcohol Use: Not on file  . Drug Use: Not on file  . Sexual Activity: Not on file   Other Topics Concern  . Not on file   Social History Narrative     Review of Systems: Positive = bold Gen: Denies any fever, chills, rigors, night sweats, anorexia, fatigue, weakness, malaise, involuntary weight loss, and sleep disorder CV: Denies chest pain, angina, palpitations, syncope, orthopnea, PND, peripheral edema, and claudication. Resp: Denies dyspnea, cough, sputum, wheezing, coughing up blood. GI: Described in detail in HPI.    GU : Denies urinary burning, blood in urine, urinary frequency, urinary hesitancy, nocturnal urination, and urinary incontinence. MS: Denies joint pain or swelling.  Denies muscle weakness, cramps, atrophy.  Derm: Denies rash, itching, oral ulcerations, hives, unhealing ulcers.  Psych: Denies depression, anxiety, memory loss, suicidal ideation, hallucinations,  and confusion. Heme: Denies bruising, bleeding, and enlarged lymph nodes. Neuro:  Denies any headaches, dizziness, paresthesias. Endo:  Denies any problems with DM, thyroid, adrenal function.  Physical Exam: Vital signs in last 24 hours: Temp:  [97.6 F (36.4 C)] 97.6 F (36.4 C) (04/02 1046) Pulse Rate:  [35-98] 98 (04/02 1300) Resp:  [14-16] 16 (04/02 1300) BP: (77-83)/(39-54) 83/53 mmHg (04/02 1300) SpO2:  [78 %-100 %] 100 % (04/02 1300) Weight:  [86.183 kg (190 lb)] 86.183 kg (190 lb) (04/02 1046)   General:   Alert, obese, cantakerous, NAD Head:  Normocephalic and atraumatic. Eyes:  Sclera clear, no icterus.   Conjunctiva pink. Ears:  Normal auditory acuity. Nose:  No deformity, discharge,  or lesions. Mouth:  No deformity or lesions.  Oropharynx pink & moist. Neck:  Supple; no masses or thyromegaly. Lungs:  Clear throughout to auscultation.   No wheezes, crackles, or rhonchi. No acute distress. Heart:  Regular rate and rhythm; no murmurs, clicks, rubs,  or gallops. Abdomen:  Soft, protuberant, nontender and nondistended. No masses, hepatosplenomegaly or hernias noted. Normal bowel sounds, without guarding, and without rebound.     Pulses:   Normal pulses noted. Extremities:  Without clubbing or edema. Neurologic:  Alert and  oriented x4; diffusely weak, lower extremity pains Psych:  Alert and cooperative. Normal mood and affect.   Lab Results:  Recent Labs  06/01/15 1125  WBC 11.1*  HGB 7.4*  HCT 23.3*  PLT 215   BMET  Recent Labs  06/01/15 1125  NA 140  K 4.5  CL 106  CO2 25  GLUCOSE 112*  BUN 60*  CREATININE 2.50*  CALCIUM 8.4*   LFT  Recent Labs  06/01/15 1125  PROT 6.1*  ALBUMIN 2.4*  AST 25  ALT 19  ALKPHOS 64  BILITOT 0.5   PT/INR  Recent Labs  06/01/15 1125  LABPROT 14.1  INR 1.07    Studies/Results: Dg Ribs Unilateral W/chest Right  06/01/2015  CLINICAL DATA:  Right rib pain after fall last night at home. EXAM: RIGHT RIBS AND CHEST - 3+ VIEW COMPARISON:  January 08, 2005. FINDINGS: No fracture or other bone lesions are seen involving the ribs. There is no evidence of  pneumothorax or pleural effusion. Both lungs are clear. Heart size and mediastinal contours are within normal limits. IMPRESSION: Normal right ribs.  No acute cardiopulmonary abnormality seen. Electronically Signed   By: Lupita RaiderJames  Green Jr, M.D.   On: 06/01/2015 11:55   Dg Hip Unilat With Pelvis 2-3 Views Right  06/01/2015  CLINICAL DATA:  Fall last night, generalized pain. EXAM: DG HIP (WITH OR WITHOUT PELVIS) 2-3V RIGHT COMPARISON:  None. FINDINGS: Single view of the pelvis and two views of the right hip are provided. There is diffuse osseous demineralization/osteopenia which limits characterization of osseous detail, however, there is no fracture line or displaced fracture fragment identified. Right femoral head appears well positioned relative to the acetabulum. Degenerative changes are seen at each hip joint, at least moderate in degree, left slightly greater than right, with associated joint space narrowing and osseous spurring. Additional degenerative change noted within the lower lumbar spine. Soft tissues about the pelvis  and right hip are unremarkable. IMPRESSION: 1. No acute findings.  No osseous fracture or dislocation seen. 2. Diffuse osseous demineralization/osteopenia. 3. Degenerative changes at the bilateral hip joints, at least moderate in degree, left slightly greater than right. Electronically Signed   By: Bary RichardStan  Maynard M.D.   On: 06/01/2015 11:52    Impression:  1.  Musculoskeletal pains after fall.  No fractures on xray. 2.  Melena. 3.  Acute blood loss anemia.  Plan:  1.  IV fluids. 2.  Blood transfusion. 3.  PPI. 4.  NPO 5.  I have advised endoscopy.  Patient adamantly refuses, despite my encouragement to the contrary.   6.  [facility] will revisit tomorrow, at which time hopefully she changes her mind about endoscopy.     Freddy JakschOUTLAW,Alecsander Hattabaugh M  06/01/2015, 1:29 PM  Pager [phone] If no answer or after 5 PM call [phone]

## 2015-06-01 NOTE — Progress Notes (Signed)
eLink Physician-Brief Progress Note Patient Name: Jasmine Newman DOB: 07-09-1931 MRN: [id number]   Date of Service  06/01/2015  HPI/Events of Note  Patient admitted with GI bleed.  Now with Hypotension.  No signs of active bleeding presently and in NAD.  Received 3 liters if IVF and getting 2 units prbc.  eICU Interventions  Start levophed Increase rate of transfusion Stat cbc when blood complete     Intervention Category Major Interventions: Hypotension - evaluation and management  Henry RusselSMITH, Tilley Faeth, P 06/01/2015, 6:13 PM

## 2015-06-01 NOTE — ED Notes (Signed)
Patient transported to X-ray 

## 2015-06-01 NOTE — ED Notes (Signed)
Pt O2 level showed 78% due to monitor slipping off Pt finger.  Actual O2 sat at 1201 is 97% on RA

## 2015-06-01 NOTE — Progress Notes (Signed)
Pharmacy Antibiotic Note  Jasmine Newman is a 82 y.o. female admitted on 06/01/2015 with GI bleed.  Pharmacy has been consulted for Unasyn dosing for rule out sepsis.  Plan:  Unasyn 1.5g IV q12h for CrCl 10-30 ml/min Follow up renal function & cultures De-escalate or stop abx as warranted (procalcitonin ordered)  Height: 5\' 2"  (157.5 cm) Weight: 190 lb (86.183 kg) IBW/kg (Calculated) : 50.1  Temp (24hrs), Avg:96.2 F (35.7 C), Min:95.3 F (35.2 C), Max:97.6 F (36.4 C)   Recent Labs Lab 06/01/15 1125 06/01/15 1529  WBC 11.1*  --   CREATININE 2.50*  --   LATICACIDVEN  --  0.87    Estimated Creatinine Clearance: 18 mL/min (by C-G formula based on Cr of 2.5).    No Known Allergies  Antimicrobials this admission: 4/2 >> Unasyn >>  Dose adjustments this admission: n/a  Microbiology results: 4/2 BCx: sent 4/2 UCx: sent  4/2 Sputum: ordered  Thank you for allowing pharmacy to be a part of this patient's care.  Loralee PacasErin Rodrigues Urbanek, PharmD, BCPS Pager: [phone] 06/01/2015 4:07 PM

## 2015-06-02 ENCOUNTER — Other Ambulatory Visit (HOSPITAL_COMMUNITY): Payer: Medicare Other

## 2015-06-02 ENCOUNTER — Encounter (HOSPITAL_COMMUNITY)
Admission: EM | Disposition: A | Discharge status: Skilled Nursing Facility | Payer: Self-pay | Source: Home / Self Care | Attending: Pulmonary Disease

## 2015-06-02 ENCOUNTER — Encounter (HOSPITAL_COMMUNITY): Payer: Self-pay | Admitting: Certified Registered Nurse Anesthetist

## 2015-06-02 ENCOUNTER — Encounter (HOSPITAL_COMMUNITY): Payer: Self-pay

## 2015-06-02 ENCOUNTER — Inpatient Hospital Stay (HOSPITAL_COMMUNITY): Payer: Medicare Other

## 2015-06-02 DIAGNOSIS — D6489 Other specified anemias: Secondary | ICD-10-CM

## 2015-06-02 DIAGNOSIS — K922 Gastrointestinal hemorrhage, unspecified: Secondary | ICD-10-CM

## 2015-06-02 DIAGNOSIS — J9601 Acute respiratory failure with hypoxia: Secondary | ICD-10-CM

## 2015-06-02 DIAGNOSIS — R579 Shock, unspecified: Secondary | ICD-10-CM

## 2015-06-02 DIAGNOSIS — I249 Acute ischemic heart disease, unspecified: Secondary | ICD-10-CM

## 2015-06-02 HISTORY — PX: ESOPHAGOGASTRODUODENOSCOPY: SHX5428

## 2015-06-02 LAB — BLOOD GAS, ARTERIAL
Acid-base deficit: 4.3 mmol/L — ABNORMAL HIGH (ref 0.0–2.0)
BICARBONATE: 20.4 meq/L (ref 20.0–24.0)
Drawn by: 257701
FIO2: 1
LHR: 16 {breaths}/min
O2 SAT: 99.9 %
PATIENT TEMPERATURE: 98.6
PEEP/CPAP: 5 cmH2O
PO2 ART: 396 mmHg — AB (ref 80.0–100.0)
TCO2: 19.2 mmol/L (ref 0–100)
VT: 450 mL
pCO2 arterial: 38.2 mmHg (ref 35.0–45.0)
pH, Arterial: 7.348 — ABNORMAL LOW (ref 7.350–7.450)

## 2015-06-02 LAB — GLUCOSE, CAPILLARY
GLUCOSE-CAPILLARY: 160 mg/dL — AB (ref 65–99)
GLUCOSE-CAPILLARY: 129 mg/dL — AB (ref 65–99)
GLUCOSE-CAPILLARY: 122 mg/dL — AB (ref 65–99)
Glucose-Capillary: 117 mg/dL — ABNORMAL HIGH (ref 65–99)
Glucose-Capillary: 130 mg/dL — ABNORMAL HIGH (ref 65–99)
Glucose-Capillary: 119 mg/dL — ABNORMAL HIGH (ref 65–99)

## 2015-06-02 LAB — CORTISOL: CORTISOL PLASMA: 7.5 ug/dL

## 2015-06-02 LAB — BASIC METABOLIC PANEL
ANION GAP: 7 (ref 5–15)
ANION GAP: 8 (ref 5–15)
BUN: 40 mg/dL — ABNORMAL HIGH (ref 6–20)
BUN: 56 mg/dL — AB (ref 6–20)
CALCIUM: 7.5 mg/dL — AB (ref 8.9–10.3)
CALCIUM: 7.6 mg/dL — AB (ref 8.9–10.3)
CHLORIDE: 111 mmol/L (ref 101–111)
CO2: 23 mmol/L (ref 22–32)
CO2: 22 mmol/L (ref 22–32)
CREATININE: 1.72 mg/dL — AB (ref 0.44–1.00)
Chloride: 112 mmol/L — ABNORMAL HIGH (ref 101–111)
Creatinine, Ser: 2.04 mg/dL — ABNORMAL HIGH (ref 0.44–1.00)
GFR calc Af Amer: 25 mL/min — ABNORMAL LOW (ref >60)
GFR calc non Af Amer: 27 mL/min — ABNORMAL LOW (ref >60)
GFR, EST AFRICAN AMERICAN: 31 mL/min — AB (ref >60)
GFR, EST NON AFRICAN AMERICAN: 22 mL/min — AB (ref >60)
GLUCOSE: 195 mg/dL — AB (ref 65–99)
Glucose, Bld: 136 mg/dL — ABNORMAL HIGH (ref 65–99)
Potassium: 4.2 mmol/L (ref 3.5–5.1)
Potassium: 4.2 mmol/L (ref 3.5–5.1)
SODIUM: 141 mmol/L (ref 135–145)
SODIUM: 142 mmol/L (ref 135–145)

## 2015-06-02 LAB — CK TOTAL AND CKMB (NOT AT ARMC)
CK TOTAL: 199 U/L (ref 38–234)
CK, MB: 28 ng/mL — ABNORMAL HIGH (ref 0.5–5.0)
CK, MB: 32.1 ng/mL — AB (ref 0.5–5.0)
Relative Index: 8.9 — ABNORMAL HIGH (ref 0.0–2.5)
Relative Index: 16.1 — ABNORMAL HIGH (ref 0.0–2.5)
Total CK: 316 U/L — ABNORMAL HIGH (ref 38–234)

## 2015-06-02 LAB — TROPONIN I
TROPONIN I: 3.99 ng/mL — AB (ref <0.031)
TROPONIN I: 4.42 ng/mL — AB (ref <0.031)
Troponin I: 3.27 ng/mL (ref <0.031)

## 2015-06-02 LAB — CBC
HCT: 21.6 % — ABNORMAL LOW (ref 36.0–46.0)
Hemoglobin: 7.1 g/dL — ABNORMAL LOW (ref 12.0–15.0)
MCH: 26.5 pg (ref 26.0–34.0)
MCHC: 32.9 g/dL (ref 30.0–36.0)
MCV: 80.6 fL (ref 78.0–100.0)
Platelets: 240 10*3/uL (ref 150–400)
RBC: 2.68 MIL/uL — ABNORMAL LOW (ref 3.87–5.11)
RDW: 18.6 % — ABNORMAL HIGH (ref 11.5–15.5)
WBC: 13.6 10*3/uL — ABNORMAL HIGH (ref 4.0–10.5)

## 2015-06-02 LAB — HEMOGLOBIN AND HEMATOCRIT, BLOOD
HEMATOCRIT: 24.9 % — AB (ref 36.0–46.0)
HEMATOCRIT: 22.3 % — AB (ref 36.0–46.0)
HEMOGLOBIN: 8.2 g/dL — AB (ref 12.0–15.0)
Hemoglobin: 7.7 g/dL — ABNORMAL LOW (ref 12.0–15.0)

## 2015-06-02 LAB — PHOSPHORUS: PHOSPHORUS: 3 mg/dL (ref 2.5–4.6)

## 2015-06-02 LAB — PREPARE RBC (CROSSMATCH)

## 2015-06-02 LAB — MAGNESIUM: Magnesium: 2 mg/dL (ref 1.7–2.4)

## 2015-06-02 LAB — LACTIC ACID, PLASMA: LACTIC ACID, VENOUS: 1 mmol/L (ref 0.5–2.0)

## 2015-06-02 SURGERY — CANCELLED PROCEDURE
Laterality: Left

## 2015-06-02 SURGERY — EGD (ESOPHAGOGASTRODUODENOSCOPY)
Anesthesia: Moderate Sedation

## 2015-06-02 MED ORDER — ANTISEPTIC ORAL RINSE SOLUTION (CORINZ): 7.0000 mL | Freq: Four times a day (QID) | OROMUCOSAL | Status: DC

## 2015-06-02 MED ORDER — PROPOFOL 10 MG/ML IV BOLUS
INTRAVENOUS | Status: AC
  Filled 2015-06-02: qty 40

## 2015-06-02 MED ORDER — SODIUM CHLORIDE 0.9 % IV SOLN
0.0000 ug/h | INTRAVENOUS | Status: DC
  Administered 2015-06-02: 50 ug/h via INTRAVENOUS
  Filled 2015-06-02 (×3): qty 50

## 2015-06-02 MED ORDER — ANTISEPTIC ORAL RINSE SOLUTION (CORINZ)
7.0000 mL | Freq: Four times a day (QID) | OROMUCOSAL | Status: DC
  Administered 2015-06-03 (×2): 7 mL via OROMUCOSAL

## 2015-06-02 MED ORDER — MIDAZOLAM HCL 2 MG/2ML IJ SOLN
INTRAMUSCULAR | Status: AC
  Administered 2015-06-02: 2 mg
  Filled 2015-06-02: qty 2

## 2015-06-02 MED ORDER — FENTANYL CITRATE (PF) 100 MCG/2ML IJ SOLN
INTRAMUSCULAR | Status: AC
  Filled 2015-06-02: qty 2

## 2015-06-02 MED ORDER — MIDAZOLAM HCL 2 MG/2ML IJ SOLN
1.0000 mg | INTRAMUSCULAR | Status: DC | PRN
  Administered 2015-06-02: 2 mg via INTRAVENOUS
  Filled 2015-06-02: qty 2

## 2015-06-02 MED ORDER — DEXTROSE 5 % IV SOLN
30.0000 ug/min | INTRAVENOUS | Status: DC
  Administered 2015-06-02: 140 ug/min via INTRAVENOUS
  Administered 2015-06-02: 200 ug/min via INTRAVENOUS
  Administered 2015-06-02: 140 ug/min via INTRAVENOUS
  Administered 2015-06-02: 200 ug/min via INTRAVENOUS
  Administered 2015-06-03: 35 ug/min via INTRAVENOUS
  Administered 2015-06-04: 55 ug/min via INTRAVENOUS
  Administered 2015-06-05: 30 ug/min via INTRAVENOUS
  Administered 2015-06-05: 25 ug/min via INTRAVENOUS
  Administered 2015-06-06: 40 ug/min via INTRAVENOUS
  Filled 2015-06-02 (×10): qty 4

## 2015-06-02 MED ORDER — FENTANYL CITRATE (PF) 100 MCG/2ML IJ SOLN
INTRAMUSCULAR | Status: AC
Start: 2015-06-02 — End: 2015-06-02
  Administered 2015-06-02: 100 ug
  Filled 2015-06-02: qty 2

## 2015-06-02 MED ORDER — VASOPRESSIN 20 UNIT/ML IV SOLN
0.0300 [IU]/min | INTRAVENOUS | Status: DC
  Administered 2015-06-02 – 2015-06-03 (×2): 0.03 [IU]/min via INTRAVENOUS
  Filled 2015-06-02 (×2): qty 2

## 2015-06-02 MED ORDER — CHLORHEXIDINE GLUCONATE 0.12% ORAL RINSE (MEDLINE KIT)
15.0000 mL | Freq: Two times a day (BID) | OROMUCOSAL | Status: DC
  Administered 2015-06-02 – 2015-06-03 (×2): 15 mL via OROMUCOSAL

## 2015-06-02 MED ORDER — SODIUM CHLORIDE 0.9 % IV SOLN: INTRAVENOUS | Status: DC

## 2015-06-02 MED ORDER — VANCOMYCIN HCL IN DEXTROSE 1-5 GM/200ML-% IV SOLN
1000.0000 mg | Freq: Every day | INTRAVENOUS | Status: DC
  Administered 2015-06-02 – 2015-06-03 (×2): 1000 mg via INTRAVENOUS
  Filled 2015-06-02 (×2): qty 200

## 2015-06-02 MED ORDER — MIDAZOLAM HCL 10 MG/2ML IJ SOLN
INTRAMUSCULAR | Status: DC | PRN
  Administered 2015-06-02 (×4): 1 mg via INTRAVENOUS

## 2015-06-02 MED ORDER — CEFTRIAXONE SODIUM 2 G IJ SOLR
2.0000 g | Freq: Every day | INTRAMUSCULAR | Status: DC
  Administered 2015-06-02 – 2015-06-04 (×3): 2 g via INTRAVENOUS
  Filled 2015-06-02 (×3): qty 2

## 2015-06-02 MED ORDER — PHENYLEPHRINE HCL 10 MG/ML IJ SOLN
30.0000 ug/min | INTRAVENOUS | Status: DC
  Administered 2015-06-02: 200 ug/min via INTRAVENOUS
  Filled 2015-06-02: qty 1

## 2015-06-02 MED ORDER — FENTANYL CITRATE (PF) 100 MCG/2ML IJ SOLN
50.0000 ug | INTRAMUSCULAR | Status: DC | PRN
  Administered 2015-06-02: 100 ug via INTRAVENOUS
  Administered 2015-06-04: 50 ug via INTRAVENOUS
  Filled 2015-06-02 (×2): qty 2

## 2015-06-02 MED ORDER — ONDANSETRON HCL 4 MG/2ML IJ SOLN
INTRAMUSCULAR | Status: AC
  Filled 2015-06-02: qty 2

## 2015-06-02 MED ORDER — ONDANSETRON HCL 4 MG/2ML IJ SOLN
4.0000 mg | Freq: Three times a day (TID) | INTRAMUSCULAR | Status: DC | PRN
  Administered 2015-06-02: 4 mg via INTRAVENOUS
  Filled 2015-06-02: qty 2

## 2015-06-02 MED ORDER — SODIUM CHLORIDE 0.9 % IJ SOLN
INTRAMUSCULAR | Status: DC | PRN
  Administered 2015-06-02: 3.5 mL

## 2015-06-02 MED ORDER — SODIUM CHLORIDE 0.9 % IV SOLN: Freq: Once | INTRAVENOUS | Status: AC

## 2015-06-02 MED ORDER — MIDAZOLAM HCL 5 MG/ML IJ SOLN
INTRAMUSCULAR | Status: AC
  Filled 2015-06-02: qty 2

## 2015-06-02 MED ORDER — PHENYLEPHRINE 40 MCG/ML (10ML) SYRINGE FOR IV PUSH (FOR BLOOD PRESSURE SUPPORT)
PREFILLED_SYRINGE | INTRAVENOUS | Status: AC
  Filled 2015-06-02: qty 10

## 2015-06-02 MED ORDER — EPINEPHRINE HCL 0.1 MG/ML IJ SOSY
PREFILLED_SYRINGE | INTRAMUSCULAR | Status: AC
  Filled 2015-06-02: qty 10

## 2015-06-02 MED ORDER — SODIUM CHLORIDE 0.9 % IV SOLN
Freq: Once | INTRAVENOUS | Status: AC
  Administered 2015-06-02: 03:00:00 via INTRAVENOUS

## 2015-06-02 MED ORDER — CHLORHEXIDINE GLUCONATE 0.12% ORAL RINSE (MEDLINE KIT): 15.0000 mL | Freq: Two times a day (BID) | OROMUCOSAL | Status: DC

## 2015-06-02 NOTE — Progress Notes (Signed)
eLink Physician-Brief Progress Note Patient Name: Jasmine Newman DOB: 17-Apr-1931 MRN: [id number]   Date of Service  06/02/2015  HPI/Events of Note  New fib Gi bleed Rate is contrlled on own  eICU Interventions  Repeat cbc Get lytes Add neo, vaso in hopes to dc levophed     Intervention Category Major Interventions: Arrhythmia - evaluation and management  Nelda BucksFEINSTEIN,DANIEL J. 06/02/2015, 12:34 AM

## 2015-06-02 NOTE — Procedures (Signed)
Intubation Procedure Note Jasmine PonsBetty G Newman [id number] 1931/04/11  Procedure: Intubation Indications: Airway protection and maintenance  Procedure Details Consent: Risks of procedure as well as the alternatives and risks of each were explained to the (patient/caregiver).  Consent for procedure obtained. Time Out: Verified patient identification, verified procedure, site/side was marked, verified correct patient position, special equipment/implants available, medications/allergies/relevent history reviewed, required imaging and test results available.  Performed  Maximum sterile technique was used including gloves, hand hygiene and mask.  MAC    Evaluation Hemodynamic Status: BP stable throughout; O2 sats: stable throughout Patient's Current Condition: stable Complications: No apparent complications Patient did tolerate procedure well. Chest X-ray ordered to verify placement.  CXR: pending.   Jasmine Newman,Jasmine Newman 06/02/2015

## 2015-06-02 NOTE — Anesthesia Preprocedure Evaluation (Deleted)
Anesthesia Evaluation    Airway       Dental   Pulmonary former smoker,          Cardiovascular hypertension,     Neuro/Psych    GI/Hepatic   Endo/Other    Renal/GU      Musculoskeletal   Abdominal   Peds  Hematology   Anesthesia Other Findings   Reproductive/Obstetrics                          Anesthesia Physical Anesthesia Plan Anesthesia Quick Evaluation  

## 2015-06-02 NOTE — Progress Notes (Signed)
Came to ICU to do EGD after talking to Dr Bard HerbertYacob who felt it was safe to proceed.  EGD shows a very large duodenal bulb ulcer with adherent clot. Washing some of clot off shows the bottom 3rd of the ulcer crater to have bluish red color. Any of this area could bleed. Could be a vessel under all this but not clearly identified. No active bleeding right now. I injected 3.5 cc of epinephrine around the site. If she rebleeds would ask IR to see for embolization.

## 2015-06-02 NOTE — Progress Notes (Signed)
[facility] Progress Note  Subjective: The patient in ICU nurse denies that she's had no stools overnight. The patient does not want to have an endoscopy done but states that her husband insists.  Objective: Vital signs in last 24 hours: Temp:  [95 F (35 Newman)-99.3 F (37.4 Newman)] 98.8 F (37.1 Newman) (04/03 0700) Pulse Rate:  [35-119] 81 (04/03 0700) Resp:  [9-28] 18 (04/03 0700) BP: (71-137)/(39-76) 118/46 mmHg (04/03 0700) SpO2:  [78 %-100 %] 100 % (04/03 0700) Weight:  [86.183 kg (190 lb)-95.1 kg (209 lb 10.5 oz)] 95.1 kg (209 lb 10.5 oz) (04/03 0500) Weight change:    PE: Unchanged  Lab Results: Results for orders placed or performed during the hospital encounter of 06/01/15 (from the past 24 hour(s))  POC occult blood, ED     Status: Abnormal   Collection Time: 06/01/15 11:15 AM  Result Value Ref Range   Fecal Occult Bld POSITIVE (A) NEGATIVE  ABO/Rh     Status: None   Collection Time: 06/01/15 11:20 AM  Result Value Ref Range   ABO/RH(D) O POS   CBC with Differential     Status: Abnormal   Collection Time: 06/01/15 11:25 AM  Result Value Ref Range   WBC 11.1 (H) 4.0 - 10.5 K/uL   RBC 3.05 (L) 3.87 - 5.11 MIL/uL   Hemoglobin 7.4 (L) 12.0 - 15.0 g/dL   HCT 40.9 (L) 81.1 - 91.4 %   MCV 76.4 (L) 78.0 - 100.0 fL   MCH 24.3 (L) 26.0 - 34.0 pg   MCHC 31.8 30.0 - 36.0 g/dL   RDW 78.2 (H) 95.6 - 21.3 %   Platelets 215 150 - 400 K/uL   Neutrophils Relative % 81 %   Neutro Abs 9.0 (H) 1.7 - 7.7 K/uL   Lymphocytes Relative 12 %   Lymphs Abs 1.3 0.7 - 4.0 K/uL   Monocytes Relative 7 %   Monocytes Absolute 0.8 0.1 - 1.0 K/uL   Eosinophils Relative 0 %   Eosinophils Absolute 0.0 0.0 - 0.7 K/uL   Basophils Relative 0 %   Basophils Absolute 0.0 0.0 - 0.1 K/uL  Comprehensive metabolic panel     Status: Abnormal   Collection Time: 06/01/15 11:25 AM  Result Value Ref Range   Sodium 140 135 - 145 mmol/L   Potassium 4.5 3.5 - 5.1 mmol/L   Chloride 106 101 - 111 mmol/L   CO2 25 22 - 32 mmol/L   Glucose, Bld 112 (H) 65 - 99 mg/dL   BUN 60 (H) 6 - 20 mg/dL   Creatinine, Ser 0.86 (H) 0.44 - 1.00 mg/dL   Calcium 8.4 (L) 8.9 - 10.3 mg/dL   Total Protein 6.1 (L) 6.5 - 8.1 g/dL   Albumin 2.4 (L) 3.5 - 5.0 g/dL   AST 25 15 - 41 U/L   ALT 19 14 - 54 U/L   Alkaline Phosphatase 64 38 - 126 U/L   Total Bilirubin 0.5 0.3 - 1.2 mg/dL   GFR calc non Af Amer 17 (L) >60 mL/min   GFR calc Af Amer 20 (L) >60 mL/min   Anion gap 9 5 - 15  Protime-INR     Status: None   Collection Time: 06/01/15 11:25 AM  Result Value Ref Range   Prothrombin Time 14.1 11.6 - 15.2 seconds   INR 1.07 0.00 - 1.49  APTT     Status: None   Collection Time: 06/01/15 11:25 AM  Result Value Ref Range   aPTT 27  24 - 37 seconds  CK     Status: Abnormal   Collection Time: 06/01/15 11:25 AM  Result Value Ref Range   Total CK 323 (H) 38 - 234 U/L  Type and screen     Status: None (Preliminary result)   Collection Time: 06/01/15 11:29 AM  Result Value Ref Range   ABO/RH(D) O POS    Antibody Screen NEG    Sample Expiration 06/04/2015    Unit Number [id number]    Blood Component Type RBC LR PHER2    Unit division 00    Status of Unit ISSUED,FINAL    Transfusion Status OK TO TRANSFUSE    Crossmatch Result Compatible    Unit Number [id number]    Blood Component Type RBC LR PHER2    Unit division 00    Status of Unit ISSUED,FINAL    Transfusion Status OK TO TRANSFUSE    Crossmatch Result Compatible    Unit Number [id number]    Blood Component Type RBC LR PHER1    Unit division 00    Status of Unit ISSUED    Transfusion Status OK TO TRANSFUSE    Crossmatch Result Compatible    Unit Number [id number]    Blood Component Type RED CELLS,LR    Unit division 00    Status of Unit ISSUED    Transfusion Status OK TO TRANSFUSE    Crossmatch Result Compatible   I-stat troponin, ED     Status: None   Collection Time: 06/01/15 11:32 AM  Result Value Ref Range   Troponin i, poc 0.04  0.00 - 0.08 ng/mL   Comment 3          Urinalysis, Routine w reflex microscopic (not at [facility])     Status: Abnormal   Collection Time: 06/01/15 11:37 AM  Result Value Ref Range   Color, Urine YELLOW YELLOW   APPearance CLOUDY (A) CLEAR   Specific Gravity, Urine 1.016 1.005 - 1.030   pH 5.0 5.0 - 8.0   Glucose, UA NEGATIVE NEGATIVE mg/dL   Hgb urine dipstick MODERATE (A) NEGATIVE   Bilirubin Urine NEGATIVE NEGATIVE   Ketones, ur NEGATIVE NEGATIVE mg/dL   Protein, ur 30 (A) NEGATIVE mg/dL   Nitrite NEGATIVE NEGATIVE   Leukocytes, UA TRACE (A) NEGATIVE  Urine microscopic-add on     Status: Abnormal   Collection Time: 06/01/15 11:37 AM  Result Value Ref Range   Squamous Epithelial / LPF NONE SEEN NONE SEEN   WBC, UA 0-5 0 - 5 WBC/hpf   RBC / HPF NONE SEEN 0 - 5 RBC/hpf   Bacteria, UA RARE (A) NONE SEEN   Casts HYALINE CASTS (A) NEGATIVE   Urine-Other MUCOUS PRESENT   Prepare RBC     Status: None   Collection Time: 06/01/15 12:52 PM  Result Value Ref Range   Order Confirmation ORDER PROCESSED BY BLOOD BANK   Culture, blood (single)     Status: None (Preliminary result)   Collection Time: 06/01/15  3:13 PM  Result Value Ref Range   Specimen Description BLOOD RIGHT ARM    Special Requests BOTTLES DRAWN AEROBIC AND ANAEROBIC 7CC    Culture  Setup Time      IN BOTH AEROBIC AND ANAEROBIC BOTTLES GRAM POSITIVE COCCI IN PAIRS IN CHAINS CRITICAL RESULT CALLED TO, READ BACK BY AND VERIFIED WITH: M.SCHRAMM,RN [id number] 06/02/15 M.CAMPBELL Performed at [facility]    Culture PENDING    Report Status PENDING   I-Stat CG4 Lactic Acid, ED  Status: None   Collection Time: 06/01/15  3:29 PM  Result Value Ref Range   Lactic Acid, Venous 0.87 0.5 - 2.0 mmol/L  MRSA PCR Screening     Status: None   Collection Time: 06/01/15  4:37 PM  Result Value Ref Range   MRSA by PCR NEGATIVE NEGATIVE  Glucose, capillary     Status: None   Collection Time: 06/01/15  4:58 PM  Result Value Ref Range    Glucose-Capillary 88 65 - 99 mg/dL  CBC     Status: Abnormal   Collection Time: 06/01/15  7:15 PM  Result Value Ref Range   WBC 10.4 4.0 - 10.5 K/uL   RBC 3.08 (L) 3.87 - 5.11 MIL/uL   Hemoglobin 8.2 (L) 12.0 - 15.0 g/dL   HCT 16.1 (L) 09.6 - 04.5 %   MCV 78.6 78.0 - 100.0 fL   MCH 26.6 26.0 - 34.0 pg   MCHC 33.9 30.0 - 36.0 g/dL   RDW 40.9 (H) 81.1 - 91.4 %   Platelets 206 150 - 400 K/uL  Lactic acid, plasma     Status: None   Collection Time: 06/01/15  8:54 PM  Result Value Ref Range   Lactic Acid, Venous 0.7 0.5 - 2.0 mmol/L  Glucose, capillary     Status: Abnormal   Collection Time: 06/01/15  8:55 PM  Result Value Ref Range   Glucose-Capillary 112 (H) 65 - 99 mg/dL  Amylase     Status: None   Collection Time: 06/01/15  9:00 PM  Result Value Ref Range   Amylase 44 28 - 100 U/L  Lipase, blood     Status: None   Collection Time: 06/01/15  9:00 PM  Result Value Ref Range   Lipase 36 11 - 51 U/L  Troponin I     Status: Abnormal   Collection Time: 06/01/15  9:00 PM  Result Value Ref Range   Troponin I 0.90 (HH) <0.031 ng/mL  Procalcitonin     Status: None   Collection Time: 06/01/15  9:00 PM  Result Value Ref Range   Procalcitonin 0.63 ng/mL  Brain natriuretic peptide     Status: Abnormal   Collection Time: 06/01/15  9:00 PM  Result Value Ref Range   B Natriuretic Peptide 329.7 (H) 0.0 - 100.0 pg/mL  Cortisol     Status: None   Collection Time: 06/01/15  9:00 PM  Result Value Ref Range   Cortisol, Plasma 7.5 ug/dL  Hemoglobin and hematocrit, blood     Status: Abnormal   Collection Time: 06/01/15  9:00 PM  Result Value Ref Range   Hemoglobin 8.5 (L) 12.0 - 15.0 g/dL   HCT 78.2 (L) 95.6 - 21.3 %  CK total and CKMB (cardiac)not at [facility]     Status: Abnormal   Collection Time: 06/01/15  9:00 PM  Result Value Ref Range   Total CK 316 (H) 38 - 234 U/L   CK, MB 28.0 (H) 0.5 - 5.0 ng/mL   Relative Index 8.9 (H) 0.0 - 2.5  Hemoglobin and hematocrit, blood     Status:  Abnormal   Collection Time: 06/02/15 12:01 AM  Result Value Ref Range   Hemoglobin 8.2 (L) 12.0 - 15.0 g/dL   HCT 08.6 (L) 57.8 - 46.9 %  Basic metabolic panel     Status: Abnormal   Collection Time: 06/02/15 12:34 AM  Result Value Ref Range   Sodium 142 135 - 145 mmol/L   Potassium 4.2 3.5 - 5.1  mmol/L   Chloride 112 (H) 101 - 111 mmol/L   CO2 23 22 - 32 mmol/L   Glucose, Bld 195 (H) 65 - 99 mg/dL   BUN 56 (H) 6 - 20 mg/dL   Creatinine, Ser 9.142.04 (H) 0.44 - 1.00 mg/dL   Calcium 7.6 (L) 8.9 - 10.3 mg/dL   GFR calc non Af Amer 22 (L) >60 mL/min   GFR calc Af Amer 25 (L) >60 mL/min   Anion gap 7 5 - 15  Phosphorus     Status: None   Collection Time: 06/02/15 12:34 AM  Result Value Ref Range   Phosphorus 3.0 2.5 - 4.6 mg/dL  Magnesium     Status: None   Collection Time: 06/02/15 12:34 AM  Result Value Ref Range   Magnesium 2.0 1.7 - 2.4 mg/dL  CBC     Status: Abnormal   Collection Time: 06/02/15 12:34 AM  Result Value Ref Range   WBC 13.6 (H) 4.0 - 10.5 K/uL   RBC 2.68 (L) 3.87 - 5.11 MIL/uL   Hemoglobin 7.1 (L) 12.0 - 15.0 g/dL   HCT 78.221.6 (L) 95.636.0 - 21.346.0 %   MCV 80.6 78.0 - 100.0 fL   MCH 26.5 26.0 - 34.0 pg   MCHC 32.9 30.0 - 36.0 g/dL   RDW 08.618.6 (H) 57.811.5 - 46.915.5 %   Platelets 240 150 - 400 K/uL  Lactic acid, plasma     Status: None   Collection Time: 06/02/15 12:34 AM  Result Value Ref Range   Lactic Acid, Venous 1.0 0.5 - 2.0 mmol/L  Prepare RBC     Status: None   Collection Time: 06/02/15  2:28 AM  Result Value Ref Range   Order Confirmation ORDER PROCESSED BY BLOOD BANK   Troponin I     Status: Abnormal   Collection Time: 06/02/15  3:08 AM  Result Value Ref Range   Troponin I 3.99 (HH) <0.031 ng/mL    Studies/Results: Dg Ribs Unilateral W/chest Right  06/01/2015  CLINICAL DATA:  Right rib pain after fall last night at home. EXAM: RIGHT RIBS AND CHEST - 3+ VIEW COMPARISON:  January 08, 2005. FINDINGS: No fracture or other bone lesions are seen involving the  ribs. There is no evidence of pneumothorax or pleural effusion. Both lungs are clear. Heart size and mediastinal contours are within normal limits. IMPRESSION: Normal right ribs.  No acute cardiopulmonary abnormality seen. Electronically Signed   By: Lupita RaiderJames  Green Jr, M.D.   On: 06/01/2015 11:55   Dg Knee 2 Views Right  06/01/2015  CLINICAL DATA:  Right knee pain today, fall last night. EXAM: RIGHT KNEE - 1-2 VIEW COMPARISON:  None. FINDINGS: Severe tricompartmental DJD with complete loss of joint space throughout and associated osteophyte formation. There is diffuse osseous demineralization/ osteopenia. No fracture line or displaced fracture fragment seen. No appreciable joint effusion. Adjacent soft tissues are unremarkable. Sclerotic focus within the distal femur shaft is incompletely imaged but most likely benign and favored to be chronic bone infarct. IMPRESSION: 1. No acute findings.  No osseous fracture or dislocation. 2. Severe tricompartmental DJD. Electronically Signed   By: Bary RichardStan  Maynard M.D.   On: 06/01/2015 15:38   Dg Chest Portable 1 View  06/01/2015  CLINICAL DATA:  Central line placement EXAM: PORTABLE CHEST 1 VIEW COMPARISON:  Chest x-rays dated 06/01/2015 01/08/2005. FINDINGS: Mild cardiomegaly is stable. Median sternotomy wires appear intact and stable in alignment. Right IJ central line has been placed with tip adequately positioned at  the level of the mid SVC. No pneumothorax. Lungs are clear. IMPRESSION: Right IJ central line placement with tip adequately positioned at the level of the mid SVC. No pneumothorax or other procedural complicating feature. Electronically Signed   By: Bary Richard M.D.   On: 06/01/2015 15:35   Dg Hip Unilat With Pelvis 2-3 Views Right  06/01/2015  CLINICAL DATA:  Fall last night, generalized pain. EXAM: DG HIP (WITH OR WITHOUT PELVIS) 2-3V RIGHT COMPARISON:  None. FINDINGS: Single view of the pelvis and two views of the right hip are provided. There is diffuse  osseous demineralization/osteopenia which limits characterization of osseous detail, however, there is no fracture line or displaced fracture fragment identified. Right femoral head appears well positioned relative to the acetabulum. Degenerative changes are seen at each hip joint, at least moderate in degree, left slightly greater than right, with associated joint space narrowing and osseous spurring. Additional degenerative change noted within the lower lumbar spine. Soft tissues about the pelvis and right hip are unremarkable. IMPRESSION: 1. No acute findings.  No osseous fracture or dislocation seen. 2. Diffuse osseous demineralization/osteopenia. 3. Degenerative changes at the bilateral hip joints, at least moderate in degree, left slightly greater than right. Electronically Signed   By: Bary Richard M.D.   On: 06/01/2015 11:52      Assessment: GI bleeding with black stools reported yesterday, documentation scanty.  Plan: Patient is now willing to undergo endoscopy at least for now. We'll try to schedule for later today.    Jasmine Newman 06/02/2015, 8:06 AM  Pager [phone] If no answer or after 5 PM call [phone]

## 2015-06-02 NOTE — Progress Notes (Signed)
Etomidate 5ml and Rocuronium bromide 5ml given at 1614.

## 2015-06-02 NOTE — Care Management Note (Signed)
Case Management Note  Patient Details  Name: Jasmine Newman MRN: [id number] Date of Birth: 1931/06/14  Subjective/Objective:           Anemia and bleeding requiring bld transfusions         Action/Plan: Date:  June 02, 2015 Chart reviewed for concurrent status and case management needs. Will continue to follow patient for changes and needs: Marcelle Smilinghonda Davis, BSN, RN, [place]   [phone]  Expected Discharge Date:  06/05/15               Expected Discharge Plan:  Home/Self Care  In-House Referral:  NA  Discharge planning Services  CM Consult  Post Acute Care Choice:  NA Choice offered to:  NA  DME Arranged:    DME Agency:     HH Arranged:    HH Agency:     Status of Service:  Completed, signed off  Medicare Important Message Given:    Date Medicare IM Given:    Medicare IM give by:    Date Additional Medicare IM Given:    Additional Medicare Important Message give by:     If discussed at Long Length of Stay Meetings, dates discussed:    Additional Comments:  Golda AcreDavis, Rhonda Lynn, RN 06/02/2015, 10:29 AM

## 2015-06-02 NOTE — Progress Notes (Signed)
PULMONARY / CRITICAL CARE MEDICINE   Name: Jasmine Newman MRN: [id number] DOB: [birth year]    ADMISSION DATE:  06/01/2015 CONSULTATION DATE:  06/01/15  REFERRING MD:  ED  CHIEF COMPLAINT:  GI bleed, hypotension  HISTORY OF PRESENT ILLNESS:   82 year old with past mental history of hypothyroidism, hypertension, hyperlipidemia, depression. Admitted today to the ED after she had a fall at home. She is watching the [facility] basketball game yesterday with her husband. When she stood up to use the bathroom her legs gave out and she fell on the right side. She hurt her leg and chest. She did not hit her head and there was no loss of consciousness. She on the floor the whole night. When her son arrived next morning she was found to be lying in a large pool of melanotic stool. In the ED she was found to be hypertensive with hemoglobin of 7.4 (baseline unknown). She continued to be hypotensive in spite of 2 L of fluid. GI evaluated the patient. PCCM called for admission.  SUBJECTIVE: Overnight patient had further hematochezia and was transfused 2u of PRBC with improvement in vasopressor requirement. Patient also developed atrial fibrillation. Patient admits to taking Naproxen twice daily regularly. Denies any abdominal pain or nausea now although she did have some prior to admission. No emesis. No chest pain or pressure.  REVIEW OF SYSTEMS:  No headache or near syncope. No fever, chills, or sweats.   VITAL SIGNS: BP 122/64 mmHg  Pulse 67  Temp(Src) 99 F (37.2 C) (Core (Comment))  Resp 13  Ht  (1.651 m)  Wt 95.1 kg (209 lb 10.5 oz)  BMI 34.89 kg/m2  SpO2 100%  LMP  (LMP Unknown)  HEMODYNAMICS:    VENTILATOR SETTINGS:    INTAKE / OUTPUT: I/O last 3 completed shifts: In: 4040 [I.V.:2560; Blood:430; IV Piggyback:1050] Out: 30 [Urine:30]  PHYSICAL EXAMINATION: General:  Elderly female. No distress. Awake. Neuro: Oriented to person, season, and place. Grossly nonfocal.  HEENT:  Tacky  mucus membranes. No scleral icterus.  Cardiovascular:  Irregular rhythm. No appreciable JVD. Bilateral lower extremity edema. Lungs:  Clear on auscultation anteriorly. Normal work of breathing on [place] oxygen.  Abdomen: Soft. Nontender. Nondistended. Integument:  Warm & dry. No rash on exposed skin.  LABS:  BMET  Recent Labs Lab 06/01/15 1125 06/02/15 0034  NA 140 142  K 4.5 4.2  CL 106 112*  CO2 25 23  BUN 60* 56*  CREATININE 2.50* 2.04*  GLUCOSE 112* 195*    Electrolytes  Recent Labs Lab 06/01/15 1125 06/02/15 0034  CALCIUM 8.4* 7.6*  MG  --  2.0  PHOS  --  3.0    CBC  Recent Labs Lab 06/01/15 1125 06/01/15 1915 06/01/15 2100 06/02/15 0001 06/02/15 0034  WBC 11.1* 10.4  --   --  13.6*  HGB 7.4* 8.2* 8.5* 8.2* 7.1*  HCT 23.3* 24.2* 25.6* 24.9* 21.6*  PLT 215 206  --   --  240    Coag's  Recent Labs Lab 06/01/15 1125  APTT 27  INR 1.07    Sepsis Markers  Recent Labs Lab 06/01/15 1529 06/01/15 2054 06/01/15 2100 06/02/15 0034  LATICACIDVEN 0.87 0.7  --  1.0  PROCALCITON  --   --  0.63  --     ABG No results for input(s): PHART, PCO2ART, PO2ART in the last 168 hours.  Liver Enzymes  Recent Labs Lab 06/01/15 1125  AST 25  ALT 19  ALKPHOS 64  BILITOT 0.5  ALBUMIN 2.4*    Cardiac Enzymes  Recent Labs Lab 06/01/15 2100 06/02/15 0308  TROPONINI 0.90* 3.99*    Glucose  Recent Labs Lab 06/01/15 1658 06/01/15 2055  GLUCAP 88 112*    Imaging Dg Ribs Unilateral W/chest Right  06/01/2015  CLINICAL DATA:  Right rib pain after fall last night at home. EXAM: RIGHT RIBS AND CHEST - 3+ VIEW COMPARISON:  January 08, 2005. FINDINGS: No fracture or other bone lesions are seen involving the ribs. There is no evidence of pneumothorax or pleural effusion. Both lungs are clear. Heart size and mediastinal contours are within normal limits. IMPRESSION: Normal right ribs.  No acute cardiopulmonary abnormality seen. Electronically Signed   By:  Lupita RaiderJames  Green Jr, M.D.   On: 06/01/2015 11:55   Dg Knee 2 Views Right  06/01/2015  CLINICAL DATA:  Right knee pain today, fall last night. EXAM: RIGHT KNEE - 1-2 VIEW COMPARISON:  None. FINDINGS: Severe tricompartmental DJD with complete loss of joint space throughout and associated osteophyte formation. There is diffuse osseous demineralization/ osteopenia. No fracture line or displaced fracture fragment seen. No appreciable joint effusion. Adjacent soft tissues are unremarkable. Sclerotic focus within the distal femur shaft is incompletely imaged but most likely benign and favored to be chronic bone infarct. IMPRESSION: 1. No acute findings.  No osseous fracture or dislocation. 2. Severe tricompartmental DJD. Electronically Signed   By: Bary RichardStan  Maynard M.D.   On: 06/01/2015 15:38   Dg Chest Portable 1 View  06/01/2015  CLINICAL DATA:  Central line placement EXAM: PORTABLE CHEST 1 VIEW COMPARISON:  Chest x-rays dated 06/01/2015 01/08/2005. FINDINGS: Mild cardiomegaly is stable. Median sternotomy wires appear intact and stable in alignment. Right IJ central line has been placed with tip adequately positioned at the level of the mid SVC. No pneumothorax. Lungs are clear. IMPRESSION: Right IJ central line placement with tip adequately positioned at the level of the mid SVC. No pneumothorax or other procedural complicating feature. Electronically Signed   By: Bary RichardStan  Maynard M.D.   On: 06/01/2015 15:35   Dg Hip Unilat With Pelvis 2-3 Views Right  06/01/2015  CLINICAL DATA:  Fall last night, generalized pain. EXAM: DG HIP (WITH OR WITHOUT PELVIS) 2-3V RIGHT COMPARISON:  None. FINDINGS: Single view of the pelvis and two views of the right hip are provided. There is diffuse osseous demineralization/osteopenia which limits characterization of osseous detail, however, there is no fracture line or displaced fracture fragment identified. Right femoral head appears well positioned relative to the acetabulum. Degenerative  changes are seen at each hip joint, at least moderate in degree, left slightly greater than right, with associated joint space narrowing and osseous spurring. Additional degenerative change noted within the lower lumbar spine. Soft tissues about the pelvis and right hip are unremarkable. IMPRESSION: 1. No acute findings.  No osseous fracture or dislocation seen. 2. Diffuse osseous demineralization/osteopenia. 3. Degenerative changes at the bilateral hip joints, at least moderate in degree, left slightly greater than right. Electronically Signed   By: Bary RichardStan  Maynard M.D.   On: 06/01/2015 11:52    STUDIES:  X ray rib 4/2: No fractures Port CXR 4/2:  Questionable right basal hazy opacity. R IJ CVL in mid-SVC. Chronic elevation left hemidiaphragm.  R Knee 2View 4/2: No fracture or dislocation.  CULTURES: Blood Ctx x2 4/2>>> GPC 1/2 Urine Ctx x2 4/2>>> MRSA PCR 4/2:  Negative   ANTIBIOTICS: Rocephin 4/3>> Unasyn 4/2  SIGNIFICANT EVENTS: 4/2 - Admit  LINES/TUBES: R IJ CVL 4/2>>  Foley 4/2>> PIV x1  ASSESSMENT / PLAN:  PULMONARY A: Possible RLL Pneumonia Acute Hypoxic Respiratory Failure - Likely secondary to anemia & IV blood/fluid resuscitation.  P:   Continuous pulse ox. Supplemental oxygen as needed to maintain Sat >92%  CARDIOVASCULAR A:  Shock - Improving. Secondary to GIB. Elevated Troponin I - Demand ischemia vs NSTEMI. Atrial Fibrillation - Intermittent. H/O HTN H/O Hyperlipdemia H/O Bioprosthetic Valve - Porcine? Position?  P:  Weaning Vasopressin & Neosynephrine to maintain MAP>65 TTE pending Trending Troponin I No systemic anticoagulation given GIB Monitor on telemetry Vitals per unit protocol Holding Lisinopril, Lopressor, & Zocor  RENAL A:   Acute Renal Failure - Improving.  P:   Trending UOP with Foley Monitoring daily renal function & electrolytes Replacing electrolytes as indicated  GASTROINTESTINAL A:   Acute GIB- Likely upper source.  P:    Trending Hgb/Hct q4hr Re-Evaluation by GI for EGD Protonix gtt NPO  HEMATOLOGIC A:   Anemia - Secondary to GIB.  P:  Transfusing 2nd unit of PRBC (total 4u) Unable to tolerate SCDs w/ lower extremity pain  INFECTIOUS A:   Possible Sepsis Possible Aspiration Pneumonia Possible Bacteremia  P:   Cultures pending Rocephin Day #2 antibiotics & Vancomycin Day #1 Procalcitonin per algorithm  ENDOCRINE A:   Questionable Adrenal Insufficiency H/O Hypothyroidism  P:   Synthroid IV daily Accu-Checks q4hr SSI per algorithm Holding on steroid therapy at this time given possible peptic ulcer  NEUROLOGIC A:   Pain in Right Knee - No fracture of X-ray. H/O Depression  P:   Holding Wellbutrin XL & Cymbalta Holding pain medication   FAMILY  - Updates: No family at bedside currently. - Inter-disciplinary family meet or Palliative Care meeting due by:  4/9  TODAY'S SUMMARY:  A 82 year old presenting with fall, ongoing hypotension, & GI Bleed. Previously patient refused endoscopy. Patient had further hypotension with ongoing rectal bleeding and worsening anemia subsequently requiring further PRBC transfusion. Vasopressor requirement improving with blood product transfusion.   I have spent a total of 34 minutes of critical care time today caring for the patient, reviewing the patient's electronic medical record and contacting GI to make them aware of willingness to undergo endoscopy at this time.   Donna Christen Jamison Neighbor, M.D. [facility] Pulmonary & Critical Care Pager:  [phone] After 3pm or if no response, call [phone]  06/02/2015, 6:54 AM

## 2015-06-02 NOTE — Progress Notes (Signed)
eLink Physician-Brief Progress Note Patient Name: Jasmine Newman DOB: 01-28-1932 MRN: [id number]   Date of Service  06/02/2015  HPI/Events of Note  paira reported  eICU Interventions  Change to ceftriaxone vanc     Intervention Category Intermediate Interventions: Infection - evaluation and management  Nelda BucksFEINSTEIN,Sevan Mcbroom J. 06/02/2015, 6:14 AM

## 2015-06-02 NOTE — Progress Notes (Signed)
The patient was seen in the endoscopy department by anesthesia. Dr. Maple HudsonMoser the anesthesiologist evaluated the patient and was concerned about her being a high risk for sedation at this time given her medical instability. She was hypotensive, tachypnic, on two pressor agents with elevated troponins and had just recently gone into atrial fibrillation. Case discussed with Dr. Maple HudsonMoser and Dr Jamison NeighborNestor from ICU and procedure was canceled for now given her medical instability. We will be available for EGD when patient is able to safely undergo the procedure.

## 2015-06-02 NOTE — Progress Notes (Signed)
eLink Physician-Brief Progress Note Patient Name: Jasmine Newman DOB: 1931/11/07 MRN: [id number]   Date of Service  06/02/2015  HPI/Events of Note  Drop HGB Pressors better levophed  eICU Interventions  2 units Cbc to follow     Intervention Category Major Interventions: Hemorrhage - evaluation and management  Nelda BucksFEINSTEIN,DANIEL J. 06/02/2015, 1:59 AM

## 2015-06-02 NOTE — Op Note (Signed)
[facility] Patient Name: Jasmine Newman Procedure Date: 06/02/2015 MRN: [id number] Attending MD: Graylin Shiver , MD Date of Birth: 1931/05/26 CSN:  Age: 82 Admit Type: Inpatient Procedure:                Upper GI endoscopy Indications:              Melena Providers:                Graylin Shiver, MD, Waynard Edwards, RN, Kandice Robinsons, Technician Referring MD:              Medicines:                Midazolam 4 mg IV, she was on a fentanyl drip Complications:            No immediate complications. Estimated Blood Loss:     Estimated blood loss: none. Procedure:                Pre-Anesthesia Assessment:                           - Prior to the procedure, a History and Physical                            was performed, and patient medications and                            allergies were reviewed. The patient's tolerance of                            previous anesthesia was also reviewed. The risks                            and benefits of the procedure and the sedation                            options and risks were discussed with the patient.                            All questions were answered, and informed consent                            was obtained. Prior Anticoagulants: The patient has                            taken no previous anticoagulant or antiplatelet                            agents. ASA Grade Assessment: III - A patient with                            severe systemic disease. After reviewing the risks  and benefits, the patient was deemed in                            satisfactory condition to undergo the procedure.                           After obtaining informed consent, the endoscope was                            passed under direct vision. Throughout the                            procedure, the patient's blood pressure, pulse, and                            oxygen saturations were  monitored continuously. The                            EG-[id number] ([id number]) scope was introduced through the                            mouth, and advanced to the second part of duodenum.                            The upper GI endoscopy was accomplished without                            difficulty. The patient tolerated the procedure                            well. Scope In: Scope Out: Findings:      The examined esophagus was normal.      The entire examined stomach was normal.      One cratered duodenal ulcer with adherent clot was found in the duodenal       bulb. The bottom 3rd of the ulcer crater was bluish-red. Any of this       area could bleed. The lesion was 25 mm in largest dimension. Area was       succesfully injected with 3.5cc of a 1:10,000 solution of epinephrine       for hemostasis. Impression:               - Normal esophagus.                           - Normal stomach.                           - One duodenal ulcer with adherent clot.                           - No specimens collected. Moderate Sedation:      Moderate (conscious) sedation was administered by the endoscopy nurse       and supervised by the endoscopist. The following parameters were       monitored: oxygen saturation, heart rate, blood pressure, respiratory  rate, EKG, adequacy of pulmonary ventilation, and response to care. Recommendation:           - NPO.                           - Continue present medications. Procedure Code(s):        --- Professional ---                           [phone], Esophagogastroduodenoscopy, flexible,                            transoral; with control of bleeding, any method Diagnosis Code(s):        --- Professional ---                           K26.4, Chronic or unspecified duodenal ulcer with                            hemorrhage                           K92.1, Melena (includes Hematochezia) CPT copyright 2016 [employer]. All rights  reserved. The codes documented in this report are preliminary and upon coder review may  be revised to meet current compliance requirements. Herbert Moors, MD Graylin Shiver, MD 06/02/2015 8:13:02 PM This report has been signed electronically. Number of Addenda: 0

## 2015-06-02 NOTE — Progress Notes (Signed)
eLink Physician-Brief Progress Note Patient Name: Jasmine PonsBetty G Muraoka DOB: 1932-01-23 MRN: [id number]   Date of Service  06/02/2015  HPI/Events of Note  Need for cont sedation  eICU Interventions  fent gtt     Intervention Category Major Interventions: Delirium, psychosis, severe agitation - evaluation and management  Stonewall Doss V. 06/02/2015, 6:09 PM

## 2015-06-02 NOTE — Progress Notes (Signed)
S:   PM assessment - RN reports one large melenotic stool today only.  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  Denies chest pain or shortness of breath.  Remains on 0.03 units vasopressin and neosynephrine 130 mcg    O: Blood pressure 98/50, pulse 68, temperature 97.7 F (36.5 C), temperature source Oral, resp. rate 14, height 5\' 5"  (1.651 m), weight 209 lb 10.5 oz (95.1 kg), SpO2 96 %.   General: elderly female in NAD lying in bed  Neuro: AAOx4, speech clear, MAE, generalized weakness HEENT: mm pale/moist, no jvd, R IJ TLC c/d/i CV: s1s2 rrr, SR 60's, no m/r/g PULM: even/non-labored on RA, sats 99% or greater, lungs bilaterally with fine basilar crackles  GI: obese/soft, bsx4 active  Extremities: warm/dry, pale, no rashes or lesions    Recent Labs Lab 06/01/15 1125 06/01/15 1915  06/02/15 0001 06/02/15 0034 06/02/15 1005  HGB 7.4* 8.2*  < > 8.2* 7.1* 7.7*  HCT 23.3* 24.2*  < > 24.9* 21.6* 22.3*  WBC 11.1* 10.4  --   --  13.6*  --   PLT 215 206  --   --  240  --   < > = values in this interval not displayed.   Recent Labs Lab 06/01/15 1125 06/02/15 0034  NA 140 142  K 4.5 4.2  CL 106 112*  CO2 25 23  GLUCOSE 112* 195*  BUN 60* 56*  CREATININE 2.50* 2.04*  CALCIUM 8.4* 7.6*  MG  --  2.0  PHOS  --  3.0      Recent Labs Lab 06/01/15 2100 06/02/15 0308 06/02/15 1005  TROPONINI 0.90* 3.99* 4.42*     A:  Shock secondary to GIB  Elevated Troponin  Atrial Fibrillation - resolved  AKI  Acute Blood Loss Anemia   P: Continue vasopressor suport Only one BM 4/3 per RN, ? Slowing Continue protonix gtt Hold EGD for now, will need to determine timing of procedure.  Note earlier concerns of NSTEMI.   If pt were to decompensate, will need to be intubated for immediate EGD Will discuss elective intubation for EGD with attending MD Monitor hemodynamics in ICU Pending 2 units PRBC's now Follow up troponin  Repeat BMP with next lab draw    Canary BrimBrandi Ollis,  NP-C [facility] Pgr: [phone] or if no answer [phone] 06/02/2015, 4:26 PM  Attending Note:  82 year old female with UGI bleed, turned down by anesthesia for EGD due to hypotension and positive troponin.  Patient truly is hypotensive and troponins are rising.  Patient is saturating poorly with all the fluid being given and continues to bleed.  In my opinion safest course of action here, is to intubate the patient.  Keep her comfortable given rising troponin and desaturation, have GI perform the EGD to control the source of bleeding then extubate in AM when patient is more stable.  I discussed this with Dr. Jamison NeighborNestor and Dr. Evette CristalGanem.  Spoke to patient's husband over the phone and patient bedside and they are in agreement with the plan.  Will proceed with intubation and sedation for GI to perform EGD.  The patient is critically ill with multiple organ systems failure and requires high complexity decision making for assessment and support, frequent evaluation and titration of therapies, application of advanced monitoring technologies and extensive interpretation of multiple databases.   Critical Care Time devoted to patient care services described in this note is  60  Minutes. This time reflects time of  care of this signee Dr Jennet Maduro. This critical care time does not reflect procedure time, or teaching time or supervisory time of PA/NP/Med student/Med Resident etc but could involve care discussion time.  Rush Farmer, M.D. [facility] Pulmonary/Critical Care Medicine. Pager: [phone]. After hours pager: [phone].

## 2015-06-02 NOTE — Progress Notes (Signed)
Pharmacy Antibiotic Note  Jasmine Newman is a 82 y.o. female admitted on 06/01/2015 with bacteremia.  Pharmacy has been consulted for Vancomycin dosing.  Plan: Vancomycin 1gm IV every 24 hours.  Goal trough 15-20 mcg/mL.  Height: 5\' 5"  (165.1 cm) Weight: 203 lb 14.8 oz (92.5 kg) IBW/kg (Calculated) : 57  Temp (24hrs), Avg:98.1 F (36.7 C), Min:95 F (35 C), Max:99.3 F (37.4 C)   Recent Labs Lab 06/01/15 1125 06/01/15 1529 06/01/15 1915 06/01/15 2054 06/02/15 0034  WBC 11.1*  --  10.4  --  13.6*  CREATININE 2.50*  --   --   --  2.04*  LATICACIDVEN  --  0.87  --  0.7 1.0    Estimated Creatinine Clearance: 24.3 mL/min (by C-G formula based on Cr of 2.04).    No Known Allergies  Antimicrobials this admission: 4/2 >> Unasyn >> 4/3 Vancomycin 4/3 >> Ceftriaxone 4/3 >>   Microbiology results: 4/2 BCx: sent 4/2 UCx: sent  4/2 Sputum: ordered  Thank you for allowing pharmacy to be a part of this patient's care.  Aleene DavidsonGrimsley Jr, Abdullahi Vallone Crowford 06/02/2015 6:20 AM

## 2015-06-03 ENCOUNTER — Inpatient Hospital Stay (HOSPITAL_COMMUNITY): Payer: Medicare Other

## 2015-06-03 DIAGNOSIS — R571 Hypovolemic shock: Secondary | ICD-10-CM

## 2015-06-03 DIAGNOSIS — K264 Chronic or unspecified duodenal ulcer with hemorrhage: Secondary | ICD-10-CM

## 2015-06-03 DIAGNOSIS — I4891 Unspecified atrial fibrillation: Secondary | ICD-10-CM

## 2015-06-03 DIAGNOSIS — A419 Sepsis, unspecified organism: Secondary | ICD-10-CM

## 2015-06-03 DIAGNOSIS — D62 Acute posthemorrhagic anemia: Secondary | ICD-10-CM

## 2015-06-03 LAB — TYPE AND SCREEN
ABO/RH(D): O POS
Antibody Screen: NEGATIVE
UNIT DIVISION: 0
UNIT DIVISION: 0
UNIT DIVISION: 0
Unit division: 0
Unit division: 0
Unit division: 0

## 2015-06-03 LAB — CBC WITH DIFFERENTIAL/PLATELET
BASOS ABS: 0 10*3/uL (ref 0.0–0.1)
Basophils Relative: 0 %
EOS ABS: 0.3 10*3/uL (ref 0.0–0.7)
EOS PCT: 3 %
HCT: 27.3 % — ABNORMAL LOW (ref 36.0–46.0)
Hemoglobin: 9 g/dL — ABNORMAL LOW (ref 12.0–15.0)
LYMPHS PCT: 26 %
Lymphs Abs: 2.6 10*3/uL (ref 0.7–4.0)
MCH: 27.6 pg (ref 26.0–34.0)
MCHC: 33 g/dL (ref 30.0–36.0)
MCV: 83.7 fL (ref 78.0–100.0)
MONO ABS: 1.2 10*3/uL — AB (ref 0.1–1.0)
Monocytes Relative: 12 %
Neutro Abs: 6.1 10*3/uL (ref 1.7–7.7)
Neutrophils Relative %: 59 %
PLATELETS: 122 10*3/uL — AB (ref 150–400)
RBC: 3.26 MIL/uL — AB (ref 3.87–5.11)
RDW: 18 % — AB (ref 11.5–15.5)
WBC: 10.1 10*3/uL (ref 4.0–10.5)

## 2015-06-03 LAB — RENAL FUNCTION PANEL
Albumin: 1.9 g/dL — ABNORMAL LOW (ref 3.5–5.0)
Anion gap: 5 (ref 5–15)
BUN: 34 mg/dL — AB (ref 6–20)
CALCIUM: 7.5 mg/dL — AB (ref 8.9–10.3)
CHLORIDE: 114 mmol/L — AB (ref 101–111)
CO2: 23 mmol/L (ref 22–32)
CREATININE: 1.56 mg/dL — AB (ref 0.44–1.00)
GFR calc Af Amer: 35 mL/min — ABNORMAL LOW (ref >60)
GFR, EST NON AFRICAN AMERICAN: 30 mL/min — AB (ref >60)
Glucose, Bld: 118 mg/dL — ABNORMAL HIGH (ref 65–99)
Phosphorus: 3 mg/dL (ref 2.5–4.6)
Potassium: 4.1 mmol/L (ref 3.5–5.1)
SODIUM: 142 mmol/L (ref 135–145)

## 2015-06-03 LAB — HEMOGLOBIN AND HEMATOCRIT, BLOOD
HEMATOCRIT: 25.8 % — AB (ref 36.0–46.0)
HEMATOCRIT: 25.6 % — AB (ref 36.0–46.0)
HEMATOCRIT: 25.4 % — AB (ref 36.0–46.0)
HEMOGLOBIN: 8.7 g/dL — AB (ref 12.0–15.0)
HEMOGLOBIN: 8.6 g/dL — AB (ref 12.0–15.0)
HEMOGLOBIN: 8.4 g/dL — AB (ref 12.0–15.0)

## 2015-06-03 LAB — BLOOD GAS, ARTERIAL
Acid-base deficit: 4.4 mmol/L — ABNORMAL HIGH (ref 0.0–2.0)
Bicarbonate: 21 mEq/L (ref 20.0–24.0)
DRAWN BY: 112491
FIO2: 0.3
MECHVT: 450 mL
O2 SAT: 96.5 %
PEEP/CPAP: 5 cmH2O
PH ART: 7.332 — AB (ref 7.350–7.450)
PO2 ART: 82.4 mmHg (ref 80.0–100.0)
Patient temperature: 35.6
RATE: 16 resp/min
TCO2: 19.9 mmol/L (ref 0–100)
pCO2 arterial: 40 mmHg (ref 35.0–45.0)

## 2015-06-03 LAB — MAGNESIUM: MAGNESIUM: 1.9 mg/dL (ref 1.7–2.4)

## 2015-06-03 LAB — GLUCOSE, CAPILLARY
GLUCOSE-CAPILLARY: 117 mg/dL — AB (ref 65–99)
GLUCOSE-CAPILLARY: 85 mg/dL (ref 65–99)
Glucose-Capillary: 104 mg/dL — ABNORMAL HIGH (ref 65–99)
Glucose-Capillary: 107 mg/dL — ABNORMAL HIGH (ref 65–99)
Glucose-Capillary: 111 mg/dL — ABNORMAL HIGH (ref 65–99)
Glucose-Capillary: 74 mg/dL (ref 65–99)
Glucose-Capillary: 79 mg/dL (ref 65–99)

## 2015-06-03 LAB — APTT: aPTT: 33 seconds (ref 24–37)

## 2015-06-03 LAB — FIBRINOGEN: FIBRINOGEN: 281 mg/dL (ref 204–475)

## 2015-06-03 LAB — TROPONIN I: TROPONIN I: 1.26 ng/mL — AB (ref <0.031)

## 2015-06-03 LAB — PROTIME-INR
INR: 1.27 (ref 0.00–1.49)
PROTHROMBIN TIME: 15.5 s — AB (ref 11.6–15.2)

## 2015-06-03 MED ORDER — CHLORHEXIDINE GLUCONATE 0.12 % MT SOLN
15.0000 mL | Freq: Two times a day (BID) | OROMUCOSAL | Status: DC
  Administered 2015-06-03 – 2015-06-06 (×5): 15 mL via OROMUCOSAL
  Filled 2015-06-03: qty 15

## 2015-06-03 MED ORDER — CETYLPYRIDINIUM CHLORIDE 0.05 % MT LIQD
7.0000 mL | Freq: Two times a day (BID) | OROMUCOSAL | Status: DC
  Administered 2015-06-05: 7 mL via OROMUCOSAL

## 2015-06-03 NOTE — Consult Note (Signed)
[facility]       Reason for Consult:Enterococcus bacteremia    Referring Physician: CHAMP autoconsult  Active Problems:   GI bleed   Pressure ulcer   . antiseptic oral rinse  7 mL Mouth Rinse QID  . cefTRIAXone (ROCEPHIN)  IV  2 g Intravenous Q0600  . chlorhexidine gluconate (SAGE KIT)  15 mL Mouth Rinse BID  . insulin aspart  0-9 Units Subcutaneous 6 times per day  . levothyroxine  25 mcg Intravenous QAC breakfast  . [START ON 06/04/2015] pantoprazole (PROTONIX) IV  40 mg Intravenous Q12H  . vancomycin  1,000 mg Intravenous Q0600    Recommendations: No changes Consider stopping antibiotics I will continue to monitor blood cultures  Assessment: She has 1/2 blood cultures with Enterococcus with a GI issue and I do not think it is significant or needs treatment, unless she does have a prosthetic valve or a second blood culture is also positive.  Her echo does not mention a bioprosthetic valve but noted in a note that she may have one.   She is also listed as 'septic' but no luekocytosis or fever and shock seems more likely due to volume from blood loss.  Possible pneumonia, now extubated.      Antibiotics: Vancomycin and ceftriaxone  HPI: Jasmine Newman is a 82 y.o. female with HTN who had passed out and found to be anemia with a GI bleed found to be from duodenum.   Blood cultures sent and did require pressor support and intubation, just extubated.  Report of prosthetic valve but echo does not reveal any prosthetic valve.    Past Medical History  Diagnosis Date  . Hypertension     Social History  Substance Use Topics  . Smoking status: Former Research scientist (life sciences)  . Smokeless tobacco: None  . Alcohol Use: None    History reviewed. No pertinent family history.  No Known Allergies  Physical Exam: Filed Vitals:   06/03/15 1551 06/03/15 1600  BP:  90/28  Pulse:  60  Temp: 97.4 F (36.3 C)   Resp:  15     Lab Results  Component Value  Date   WBC 10.1 06/03/2015   HGB 8.7* 06/03/2015   HCT 25.8* 06/03/2015   MCV 83.7 06/03/2015   PLT 122* 06/03/2015    Lab Results  Component Value Date   CREATININE 1.56* 06/03/2015   BUN 34* 06/03/2015   NA 142 06/03/2015   K 4.1 06/03/2015   CL 114* 06/03/2015   CO2 23 06/03/2015    Lab Results  Component Value Date   ALT 19 06/01/2015   AST 25 06/01/2015   ALKPHOS 64 06/01/2015     Microbiology: Recent Results (from the past 240 hour(s))  Urine culture     Status: None (Preliminary result)   Collection Time: 06/01/15 11:37 AM  Result Value Ref Range Status   Specimen Description URINE, CATHETERIZED  Final   Special Requests NONE  Final   Culture   Final    >=100,000 COLONIES/mL ENTEROCOCCUS SPECIES SUSCEPTIBILITIES TO FOLLOW Performed at [facility]    Report Status PENDING  Incomplete  Culture, blood (single)     Status: None (Preliminary result)   Collection Time: 06/01/15  3:13 PM  Result Value Ref Range Status   Specimen Description BLOOD RIGHT ARM  Final   Special Requests BOTTLES DRAWN AEROBIC AND ANAEROBIC [facility]  Final   Culture  Setup Time   Final  IN BOTH AEROBIC AND ANAEROBIC BOTTLES GRAM POSITIVE COCCI IN PAIRS IN CHAINS CRITICAL RESULT CALLED TO, READ BACK BY AND VERIFIED WITH: M.SCHRAMM,RN 3601 06/02/15 M.CAMPBELL    Culture   Final    ENTEROCOCCUS SPECIES Performed at [facility]    Report Status PENDING  Incomplete  Urine culture     Status: None (Preliminary result)   Collection Time: 06/01/15  4:30 PM  Result Value Ref Range Status   Specimen Description URINE, CATHETERIZED  Final   Special Requests NONE  Final   Culture   Final    >=100,000 COLONIES/mL ENTEROCOCCUS SPECIES Performed at [facility]    Report Status PENDING  Incomplete  MRSA PCR Screening     Status: None   Collection Time: 06/01/15  4:37 PM  Result Value Ref Range Status   MRSA by PCR NEGATIVE NEGATIVE Final    Comment:        The GeneXpert  MRSA Assay (FDA approved for NASAL specimens only), is one component of a comprehensive MRSA colonization surveillance program. It is not intended to diagnose MRSA infection nor to guide or monitor treatment for MRSA infections.   Culture, blood (routine x 2)     Status: None (Preliminary result)   Collection Time: 06/01/15  8:51 PM  Result Value Ref Range Status   Specimen Description BLOOD BLOOD LEFT HAND  Final   Special Requests BOTTLES DRAWN AEROBIC ONLY 4CC  Final   Culture   Final    NO GROWTH 1 DAY Performed at [facility]    Report Status PENDING  Incomplete    Scharlene Gloss, [facility] [place] [facility] [email or web address] [phone] pager  [phone] cell 06/03/2015, 4:27 PM

## 2015-06-03 NOTE — Procedures (Signed)
Extubation Procedure Note  Patient Details:   Name: Jasmine Newman DOB: August 26, 1931 MRN: [id number]   Airway Documentation:     Evaluation  O2 sats: stable throughout Complications: No apparent complications Patient did tolerate procedure well. Bilateral Breath Sounds: Clear, Diminished   Yes  Dairl PonderWalters, Alecea Trego Nannette 06/03/2015, 11:09 AM   Placed on 2 lpm~ PT can whisper at this time.

## 2015-06-03 NOTE — Progress Notes (Signed)
PCCM Attending: Patient comfortable on 35 minutes of PS 0/5 w/ FiO2 0.3. RR 15-20 with some mild anxiety. Mildly increased work of breathing but pulling >700cc TV with relative ease. Saturations remaining stable. Patient calmed down significantly when talking with her and her husband at bedside. Plan to check for a cuff leak and if present extubate.  Donna ChristenJennings E. Jamison NeighborNestor, M.D. [facility] Pager:  [phone] After 3pm or if no response, call [phone] 10:25 AM 06/03/2015

## 2015-06-03 NOTE — Progress Notes (Signed)
Echocardiogram 2D Echocardiogram has been performed.  Dorothey BasemanReel, Tiona Ruane M 06/03/2015, 11:07 AM

## 2015-06-03 NOTE — Progress Notes (Signed)
[facility] Progress Note  Subjective: Patient sedated, intubated, one bloody stool last night and one this morning. Hemodynamically stable,weaning pressor agents. See if one additional unit packed red blood cells last night  Objective: Vital signs in last 24 hours: Temp:  [96.6 F (35.9 C)-98.6 F (37 C)] 97.4 F (36.3 C) (04/04 0745) Pulse Rate:  [55-91] 55 (04/04 0600) Resp:  [11-25] 16 (04/04 0600) BP: (86-163)/(41-87) 118/87 mmHg (04/04 0600) SpO2:  [96 %-100 %] 100 % (04/04 0600) FiO2 (%):  [30 %-100 %] 30 % (04/04 0400) Weight:  [95.4 kg (210 lb 5.1 oz)] 95.4 kg (210 lb 5.1 oz) (04/04 0359) Weight change: 9.217 kg (20 lb 5.1 oz)   PE: Unchanged  Lab Results: Results for orders placed or performed during the hospital encounter of 06/01/15 (from the past 24 hour(s))  Glucose, capillary     Status: Abnormal   Collection Time: 06/02/15  8:33 AM  Result Value Ref Range   Glucose-Capillary 129 (H) 65 - 99 mg/dL   Comment 1 Notify RN    Comment 2 Document in Chart   Hemoglobin and hematocrit, blood     Status: Abnormal   Collection Time: 06/02/15 10:05 AM  Result Value Ref Range   Hemoglobin 7.7 (L) 12.0 - 15.0 g/dL   HCT 40.922.3 (L) 81.136.0 - 91.446.0 %  CK total and CKMB (cardiac)not at [facility]     Status: Abnormal   Collection Time: 06/02/15 10:05 AM  Result Value Ref Range   Total CK 199 38 - 234 U/L   CK, MB 32.1 (H) 0.5 - 5.0 ng/mL   Relative Index 16.1 (H) 0.0 - 2.5  Troponin I     Status: Abnormal   Collection Time: 06/02/15 10:05 AM  Result Value Ref Range   Troponin I 4.42 (HH) <0.031 ng/mL  Prepare RBC     Status: None   Collection Time: 06/02/15 12:40 PM  Result Value Ref Range   Order Confirmation ORDER PROCESSED BY BLOOD BANK   Glucose, capillary     Status: Abnormal   Collection Time: 06/02/15  1:44 PM  Result Value Ref Range   Glucose-Capillary 117 (H) 65 - 99 mg/dL  Troponin I     Status: Abnormal   Collection Time: 06/02/15  4:56 PM  Result Value  Ref Range   Troponin I 3.27 (HH) <0.031 ng/mL  Basic metabolic panel     Status: Abnormal   Collection Time: 06/02/15  4:56 PM  Result Value Ref Range   Sodium 141 135 - 145 mmol/L   Potassium 4.2 3.5 - 5.1 mmol/L   Chloride 111 101 - 111 mmol/L   CO2 22 22 - 32 mmol/L   Glucose, Bld 136 (H) 65 - 99 mg/dL   BUN 40 (H) 6 - 20 mg/dL   Creatinine, Ser 7.821.72 (H) 0.44 - 1.00 mg/dL   Calcium 7.5 (L) 8.9 - 10.3 mg/dL   GFR calc non Af Amer 27 (L) >60 mL/min   GFR calc Af Amer 31 (L) >60 mL/min   Anion gap 8 5 - 15  Glucose, capillary     Status: Abnormal   Collection Time: 06/02/15  4:56 PM  Result Value Ref Range   Glucose-Capillary 130 (H) 65 - 99 mg/dL   Comment 1 Notify RN    Comment 2 Document in Chart   Blood gas, arterial     Status: Abnormal   Collection Time: 06/02/15  5:10 PM  Result Value Ref Range   FIO2 1.00  Delivery systems VENTILATOR    Mode PRESSURE REGULATED VOLUME CONTROL    VT 450 mL   LHR 16 resp/min   Peep/cpap 5.0 cm H20   pH, Arterial 7.348 (L) 7.350 - 7.450   pCO2 arterial 38.2 35.0 - 45.0 mmHg   pO2, Arterial 396 (H) 80.0 - 100.0 mmHg   Bicarbonate 20.4 20.0 - 24.0 mEq/L   TCO2 19.2 0 - 100 mmol/L   Acid-base deficit 4.3 (H) 0.0 - 2.0 mmol/L   O2 Saturation 99.9 %   Patient temperature 98.6    Collection site RIGHT RADIAL    Drawn by [phone]    Sample type ARTERIAL DRAW   Glucose, capillary     Status: Abnormal   Collection Time: 06/02/15  8:40 PM  Result Value Ref Range   Glucose-Capillary 122 (H) 65 - 99 mg/dL  Glucose, capillary     Status: Abnormal   Collection Time: 06/03/15 12:26 AM  Result Value Ref Range   Glucose-Capillary 104 (H) 65 - 99 mg/dL  CBC with Differential/Platelet     Status: Abnormal   Collection Time: 06/03/15  2:55 AM  Result Value Ref Range   WBC 10.1 4.0 - 10.5 K/uL   RBC 3.26 (L) 3.87 - 5.11 MIL/uL   Hemoglobin 9.0 (L) 12.0 - 15.0 g/dL   HCT 04.5 (L) 40.9 - 81.1 %   MCV 83.7 78.0 - 100.0 fL   MCH 27.6 26.0 - 34.0  pg   MCHC 33.0 30.0 - 36.0 g/dL   RDW 91.4 (H) 78.2 - 95.6 %   Platelets 122 (L) 150 - 400 K/uL   Neutrophils Relative % 59 %   Neutro Abs 6.1 1.7 - 7.7 K/uL   Lymphocytes Relative 26 %   Lymphs Abs 2.6 0.7 - 4.0 K/uL   Monocytes Relative 12 %   Monocytes Absolute 1.2 (H) 0.1 - 1.0 K/uL   Eosinophils Relative 3 %   Eosinophils Absolute 0.3 0.0 - 0.7 K/uL   Basophils Relative 0 %   Basophils Absolute 0.0 0.0 - 0.1 K/uL  Renal function panel     Status: Abnormal   Collection Time: 06/03/15  2:55 AM  Result Value Ref Range   Sodium 142 135 - 145 mmol/L   Potassium 4.1 3.5 - 5.1 mmol/L   Chloride 114 (H) 101 - 111 mmol/L   CO2 23 22 - 32 mmol/L   Glucose, Bld 118 (H) 65 - 99 mg/dL   BUN 34 (H) 6 - 20 mg/dL   Creatinine, Ser 2.13 (H) 0.44 - 1.00 mg/dL   Calcium 7.5 (L) 8.9 - 10.3 mg/dL   Phosphorus 3.0 2.5 - 4.6 mg/dL   Albumin 1.9 (L) 3.5 - 5.0 g/dL   GFR calc non Af Amer 30 (L) >60 mL/min   GFR calc Af Amer 35 (L) >60 mL/min   Anion gap 5 5 - 15  Magnesium     Status: None   Collection Time: 06/03/15  2:55 AM  Result Value Ref Range   Magnesium 1.9 1.7 - 2.4 mg/dL  Blood gas, arterial     Status: Abnormal   Collection Time: 06/03/15  4:01 AM  Result Value Ref Range   FIO2 0.30    Delivery systems VENTILATOR    Mode PRESSURE REGULATED VOLUME CONTROL    VT 450 mL   LHR 16 resp/min   Peep/cpap 5.0 cm H20   pH, Arterial 7.332 (L) 7.350 - 7.450   pCO2 arterial 40.0 35.0 - 45.0 mmHg  pO2, Arterial 82.4 80.0 - 100.0 mmHg   Bicarbonate 21.0 20.0 - 24.0 mEq/L   TCO2 19.9 0 - 100 mmol/L   Acid-base deficit 4.4 (H) 0.0 - 2.0 mmol/L   O2 Saturation 96.5 %   Patient temperature 35.6    Collection site LEFT RADIAL    Drawn by [id number]    Sample type ARTERIAL DRAW    Allens test (pass/fail) PASS PASS  Glucose, capillary     Status: Abnormal   Collection Time: 06/03/15  4:52 AM  Result Value Ref Range   Glucose-Capillary 107 (H) 65 - 99 mg/dL    Studies/Results: Dg Ribs  Unilateral W/chest Right  06/01/2015  CLINICAL DATA:  Right rib pain after fall last night at home. EXAM: RIGHT RIBS AND CHEST - 3+ VIEW COMPARISON:  January 08, 2005. FINDINGS: No fracture or other bone lesions are seen involving the ribs. There is no evidence of pneumothorax or pleural effusion. Both lungs are clear. Heart size and mediastinal contours are within normal limits. IMPRESSION: Normal right ribs.  No acute cardiopulmonary abnormality seen. Electronically Signed   By: Lupita Raider, M.D.   On: 06/01/2015 11:55   Dg Knee 2 Views Right  06/01/2015  CLINICAL DATA:  Right knee pain today, fall last night. EXAM: RIGHT KNEE - 1-2 VIEW COMPARISON:  None. FINDINGS: Severe tricompartmental DJD with complete loss of joint space throughout and associated osteophyte formation. There is diffuse osseous demineralization/ osteopenia. No fracture line or displaced fracture fragment seen. No appreciable joint effusion. Adjacent soft tissues are unremarkable. Sclerotic focus within the distal femur shaft is incompletely imaged but most likely benign and favored to be chronic bone infarct. IMPRESSION: 1. No acute findings.  No osseous fracture or dislocation. 2. Severe tricompartmental DJD. Electronically Signed   By: Bary Richard M.D.   On: 06/01/2015 15:38   Dg Chest Port 1 View  06/03/2015  CLINICAL DATA:  Intubation. EXAM: EXAM PORTABLE CHEST 1 VIEW COMPARISON:  06/02/2015. FINDINGS: Endotracheal tube is been partially withdrawn, its tip is 3.4 cm above the carina. Right IJ line stable position. Mediastinum and hilar structures normal. Prior median sternotomy. Heart size normal. Low lung volumes with mild bibasilar atelectasis and/or infiltrates. Tiny left pleural effusion cannot be excluded. No pneumothorax . IMPRESSION: 1. Endotracheal tube is been withdrawn in good anatomic position. Its tip is 3.4 cm above carina. Right IJ line stable position. 2. Stable mild bibasilar atelectasis and/or infiltrates. Tiny  left pleural effusion cannot be excluded. Electronically Signed   By: Maisie Fus  Register   On: 06/03/2015 07:16   Dg Chest Port 1 View  06/02/2015  CLINICAL DATA:  Respiratory failure and endotracheal tube placement. EXAM: PORTABLE CHEST 1 VIEW COMPARISON:  06/01/2015 FINDINGS: The heart size and mediastinal contours are within normal limits. An endotracheal tube is been placed. The tip lies in the right mainstem bronchus and the tube should be pulled back at least 3-4 cm. Lungs show bibasilar atelectasis and potentially a component of bibasilar infiltrates. No edema, pneumothorax or significant pleural fluid identified. Stable appearance of right jugular central line with catheter tip in the SVC. The visualized skeletal structures are unremarkable. IMPRESSION: Endotracheal tube tip lies in the right mainstem bronchus and the tube should be pull-back roughly 3-4 cm. These results will be called to the ordering clinician or representative by the Radiologist Assistant, and communication documented in the PACS or zVision Dashboard. Electronically Signed   By: Irish Lack M.D.   On: 06/02/2015 16:45  Dg Chest Portable 1 View  06/01/2015  CLINICAL DATA:  Central line placement EXAM: PORTABLE CHEST 1 VIEW COMPARISON:  Chest x-rays dated 06/01/2015 01/08/2005. FINDINGS: Mild cardiomegaly is stable. Median sternotomy wires appear intact and stable in alignment. Right IJ central line has been placed with tip adequately positioned at the level of the mid SVC. No pneumothorax. Lungs are clear. IMPRESSION: Right IJ central line placement with tip adequately positioned at the level of the mid SVC. No pneumothorax or other procedural complicating feature. Electronically Signed   By: Bary Richard M.D.   On: 06/01/2015 15:35   Dg Hip Unilat With Pelvis 2-3 Views Right  06/01/2015  CLINICAL DATA:  Fall last night, generalized pain. EXAM: DG HIP (WITH OR WITHOUT PELVIS) 2-3V RIGHT COMPARISON:  None. FINDINGS: Single view of  the pelvis and two views of the right hip are provided. There is diffuse osseous demineralization/osteopenia which limits characterization of osseous detail, however, there is no fracture line or displaced fracture fragment identified. Right femoral head appears well positioned relative to the acetabulum. Degenerative changes are seen at each hip joint, at least moderate in degree, left slightly greater than right, with associated joint space narrowing and osseous spurring. Additional degenerative change noted within the lower lumbar spine. Soft tissues about the pelvis and right hip are unremarkable. IMPRESSION: 1. No acute findings.  No osseous fracture or dislocation seen. 2. Diffuse osseous demineralization/osteopenia. 3. Degenerative changes at the bilateral hip joints, at least moderate in degree, left slightly greater than right. Electronically Signed   By: Bary Richard M.D.   On: 06/01/2015 11:52      Assessment: 1. Giant duodenal ulcer with recent hemorrhage by EGD last night.  Plan: 1. Continue to monitor carefully for rebleeding. 2. Protonix drip 3. Carafate when able to take by mouth 4. We'll check H. pylori antibody and treat for eradication of Helicobacter if positive. 5. Repeat EGD when necessary suspicion of rebleeding.    Lakota Schweppe C 06/03/2015, 8:04 AM  Pager [phone] If no answer or after 5 PM call [phone]

## 2015-06-03 NOTE — Progress Notes (Signed)
PULMONARY / CRITICAL CARE MEDICINE   Name: Jasmine Newman MRN: [id number] DOB: 1931-03-27    ADMISSION DATE:  06/01/2015 CONSULTATION DATE:  06/01/15  REFERRING MD:  ED  CHIEF COMPLAINT:  GI bleed, hypotension  HISTORY OF PRESENT ILLNESS:   82 year old with past mental history of hypothyroidism, hypertension, hyperlipidemia, depression. Admitted today to the ED after she had a fall at home. She is watching the [facility] basketball game yesterday with her husband. When she stood up to use the bathroom her legs gave out and she fell on the right side. She hurt her leg and chest. She did not hit her head and there was no loss of consciousness. She on the floor the whole night. When her son arrived next morning she was found to be lying in a large pool of melanotic stool. In the ED she was found to be hypertensive with hemoglobin of 7.4 (baseline unknown). She continued to be hypotensive in spite of 2 L of fluid. GI evaluated the patient. PCCM called for admission.  SUBJECTIVE: Patient underwent elective intubation and EGD yesterday finding a duodenal ulcer. Patient did have a maroonish bowel movement overnight as well as 1 this morning. Continuing to wean on vasopressor infusion.  REVIEW OF SYSTEMS:  Unable to obtain given intubation.  VITAL SIGNS: BP 107/64 mmHg  Pulse 57  Temp(Src) 97.4 F (36.3 C) (Axillary)  Resp 16  Ht  (1.651 m)  Wt 210 lb 5.1 oz (95.4 kg)  BMI 35.00 kg/m2  SpO2 100%  LMP  (LMP Unknown)  HEMODYNAMICS:    VENTILATOR SETTINGS: Vent Mode:  [-] PRVC FiO2 (%):  [30 %-100 %] 30 % Set Rate:  [16 bmp] 16 bmp Vt Set:  [450 mL] 450 mL PEEP:  [5 cmH20] 5 cmH20 Plateau Pressure:  [14 cmH20-20 cmH20] 14 cmH20  INTAKE / OUTPUT: I/O last 3 completed shifts: In: 8128.1 [I.V.:6003.1; WUJWJ:1914; Other:80; IV Piggyback:350] Out: 3340 [Urine:3340]  PHYSICAL EXAMINATION: General:  Elderly female. Awake. Alert. Neuro: Following commands. Tracks to voice. Grossly  nonfocal.  HEENT: Endotracheal tube in place. No scleral injection or icterus.  Cardiovascular:  Irregular rhythm but regular rate. No appreciable JVD. Bilateral lower extremity edema persists. Lungs:  Clear bilaterally to auscultation. Symmetric chest rise on ventilator.  Abdomen: Soft. Normal bowel sounds. Nondistended. Integument:  Warm & dry. No rash on exposed skin.  LABS:  BMET  Recent Labs Lab 06/02/15 0034 06/02/15 1656 06/03/15 0255  NA 142 141 142  K 4.2 4.2 4.1  CL 112* 111 114*  CO2 BUN 56* 40* 34*  CREATININE 2.04* 1.72* 1.56*  GLUCOSE 195* 136* 118*    Electrolytes  Recent Labs Lab 06/02/15 0034 06/02/15 1656 06/03/15 0255  CALCIUM 7.6* 7.5* 7.5*  MG 2.0  --  1.9  PHOS 3.0  --  3.0    CBC  Recent Labs Lab 06/01/15 1915  06/02/15 0034 06/02/15 1005 06/03/15 0255  WBC 10.4  --  13.6*  --  10.1  HGB 8.2*  < > 7.1* 7.7* 9.0*  HCT 24.2*  < > 21.6* 22.3* 27.3*  PLT 206  --  240  --  122*  < > = values in this interval not displayed.  Coag's  Recent Labs Lab 06/01/15 1125  APTT 27  INR 1.07    Sepsis Markers  Recent Labs Lab 06/01/15 1529 06/01/15 2054 06/01/15 2100 06/02/15 0034  LATICACIDVEN 0.87 0.7  --  1.0  PROCALCITON  --   --  0.63  --     ABG  Recent Labs Lab 06/02/15 1710 06/03/15 0401  PHART 7.348* 7.332*  PCO2ART 38.2 40.0  PO2ART 396* 82.4    Liver Enzymes  Recent Labs Lab 06/01/15 1125 06/03/15 0255  AST 25  --   ALT 19  --   ALKPHOS 64  --   BILITOT 0.5  --   ALBUMIN 2.4* 1.9*    Cardiac Enzymes  Recent Labs Lab 06/02/15 0308 06/02/15 1005 06/02/15 1656  TROPONINI 3.99* 4.42* 3.27*    Glucose  Recent Labs Lab 06/02/15 1344 06/02/15 1656 06/02/15 2040 06/03/15 0026 06/03/15 0452 06/03/15 0727  GLUCAP 117* 130* 122* 104* 107* 117*    Imaging Dg Chest Port 1 View  06/03/2015  CLINICAL DATA:  Intubation. EXAM: EXAM PORTABLE CHEST 1 VIEW COMPARISON:  06/02/2015. FINDINGS:  Endotracheal tube is been partially withdrawn, its tip is 3.4 cm above the carina. Right IJ line stable position. Mediastinum and hilar structures normal. Prior median sternotomy. Heart size normal. Low lung volumes with mild bibasilar atelectasis and/or infiltrates. Tiny left pleural effusion cannot be excluded. No pneumothorax . IMPRESSION: 1. Endotracheal tube is been withdrawn in good anatomic position. Its tip is 3.4 cm above carina. Right IJ line stable position. 2. Stable mild bibasilar atelectasis and/or infiltrates. Tiny left pleural effusion cannot be excluded. Electronically Signed   By: Maisie Fus  Register   On: 06/03/2015 07:16   Dg Chest Port 1 View  06/02/2015  CLINICAL DATA:  Respiratory failure and endotracheal tube placement. EXAM: PORTABLE CHEST 1 VIEW COMPARISON:  06/01/2015 FINDINGS: The heart size and mediastinal contours are within normal limits. An endotracheal tube is been placed. The tip lies in the right mainstem bronchus and the tube should be pulled back at least 3-4 cm. Lungs show bibasilar atelectasis and potentially a component of bibasilar infiltrates. No edema, pneumothorax or significant pleural fluid identified. Stable appearance of right jugular central line with catheter tip in the SVC. The visualized skeletal structures are unremarkable. IMPRESSION: Endotracheal tube tip lies in the right mainstem bronchus and the tube should be pull-back roughly 3-4 cm. These results will be called to the ordering clinician or representative by the Radiologist Assistant, and communication documented in the PACS or zVision Dashboard. Electronically Signed   By: Irish Lack M.D.   On: 06/02/2015 16:45    STUDIES:  X ray rib 4/2: No fractures Port CXR 4/2:  Questionable right basal hazy opacity. R IJ CVL in mid-SVC. Chronic elevation left hemidiaphragm.  R Knee 2View 4/2: No fracture or dislocation. Port CXR 4/4:  Silhouetting of left hemidiaphragm unchanged. Endotracheal tube and  central venous catheter in good position. No new focal opacity.  CULTURES: Blood Ctx x2 4/2>>> GPC 1/2 Urine Ctx x2 4/2>>> Enterococcus (noncatheterized specimen) MRSA PCR 4/2:  Negative   ANTIBIOTICS: Rocephin 4/3>> Unasyn 4/2  SIGNIFICANT EVENTS: 4/2 - Admit 4/3 - Intubation & EGD  LINES/TUBES: R IJ CVL 4/2>> Foley 4/2>> PIV x1  ASSESSMENT / PLAN:  PULMONARY A: Possible RLL Pneumonia Acute Hypoxic Respiratory Failure - Likely secondary to anemia & IV blood/fluid resuscitation. Intubated for EGD 4/3.  P:   Full Vent Support SBT this AM with possible extubation Wean for FiO2 >94%  CARDIOVASCULAR A:  Shock - Improving. Secondary to GIB. Elevated Troponin I - Demand ischemia vs NSTEMI. Atrial Fibrillation - Intermittent. H/O HTN H/O Hyperlipdemia H/O Bioprosthetic Valve - Porcine? Position?  P:  Weaning Vasopressin & Neosynephrine to maintain MAP>65 TTE read pending Repeat  Troponin I tomorrow AM No systemic anticoagulation given GIB Monitor on telemetry Vitals per unit protocol Holding Lisinopril, Lopressor, & Zocor  RENAL A:   Acute Renal Failure - Improving.  P:   Trending UOP with Foley Monitoring daily renal function & electrolytes Replacing electrolytes as indicated  GASTROINTESTINAL A:   Acute GIB - Secondary to Duodenal Ulcer on EGD 4/3.  P:   GI Following Protonix gtt NPO  HEMATOLOGIC A:   Anemia - Secondary to GIB. S/P 7u PRBC. Thrombocytopenia - Mild. Likely consumption.   P:  Trending Hgb/Hct q8hr Transfuse for Hgb <8.0 Trending remaining cell counts daily w/ CBC SCDs Checking Coags now  INFECTIOUS A:   Possible Sepsis Possible Aspiration Pneumonia Possible Bacteremia  P:   Cultures pending Rocephin Day #3 antibiotics & Vancomycin Day #2 Procalcitonin per algorithm  ENDOCRINE A:   Questionable Adrenal Insufficiency H/O Hypothyroidism  P:   Synthroid IV daily Accu-Checks q4hr SSI per algorithm Holding on  steroid therapy at this time given duodenal ulcer  NEUROLOGIC A:   Pain in Right Knee - No fracture of X-ray. H/O Depression  P:   Goal RASS:  0 Fentanyl gtt on hold Holding Wellbutrin XL & Cymbalta Holding pain medication   FAMILY  - Updates: No family at bedside currently. - Inter-disciplinary family meet or Palliative Care meeting due by:  4/9  TODAY'S SUMMARY:  A 82 year old presenting with fall, ongoing hypotension, & GI Bleed. Found to have duodenal ulcer yesterday on EGD. Attempting spontaneous breathing trial this morning with probable extubation. Continuing Protonix infusion.  I have spent a total of 33 minutes of critical care time today caring for the patient and reviewing the patient's electronic medical record.   Donna ChristenJennings E. Jamison NeighborNestor, M.D. [facility] Pager:  [phone] After 3pm or if no response, call [phone]  06/03/2015, 9:31 AM

## 2015-06-03 NOTE — Progress Notes (Signed)
   06/03/15 1715  Vitals  BP (!) 87/42 mmHg  MAP (mmHg) 59  Pulse Rate (!) 53  ECG Heart Rate (!) 54   Dr. Vassie LollAlva informed of patient blood pressure and sinus bradycardia (48-59). Neo gtt currently at 55mcg and being titrated up for hypotension. Pt had 2 large maroon colored bowel movements since 7am. Order given for H&H and troponin. Patient sleeping but easy to arouse. Will continue to monitor.

## 2015-06-04 ENCOUNTER — Encounter (HOSPITAL_COMMUNITY): Payer: Self-pay | Admitting: Gastroenterology

## 2015-06-04 DIAGNOSIS — K260 Acute duodenal ulcer with hemorrhage: Secondary | ICD-10-CM

## 2015-06-04 DIAGNOSIS — I214 Non-ST elevation (NSTEMI) myocardial infarction: Secondary | ICD-10-CM

## 2015-06-04 DIAGNOSIS — B952 Enterococcus as the cause of diseases classified elsewhere: Secondary | ICD-10-CM

## 2015-06-04 DIAGNOSIS — N390 Urinary tract infection, site not specified: Secondary | ICD-10-CM

## 2015-06-04 DIAGNOSIS — Z952 Presence of prosthetic heart valve: Secondary | ICD-10-CM

## 2015-06-04 DIAGNOSIS — E876 Hypokalemia: Secondary | ICD-10-CM

## 2015-06-04 DIAGNOSIS — R7881 Bacteremia: Secondary | ICD-10-CM

## 2015-06-04 DIAGNOSIS — R6521 Severe sepsis with septic shock: Secondary | ICD-10-CM

## 2015-06-04 LAB — RENAL FUNCTION PANEL
Albumin: 2 g/dL — ABNORMAL LOW (ref 3.5–5.0)
Anion gap: 6 (ref 5–15)
BUN: 22 mg/dL — ABNORMAL HIGH (ref 6–20)
CO2: 24 mmol/L (ref 22–32)
Calcium: 7.8 mg/dL — ABNORMAL LOW (ref 8.9–10.3)
Chloride: 114 mmol/L — ABNORMAL HIGH (ref 101–111)
Creatinine, Ser: 1.38 mg/dL — ABNORMAL HIGH (ref 0.44–1.00)
GFR calc Af Amer: 40 mL/min — ABNORMAL LOW
GFR calc non Af Amer: 35 mL/min — ABNORMAL LOW
Glucose, Bld: 92 mg/dL (ref 65–99)
Phosphorus: 3 mg/dL (ref 2.5–4.6)
Potassium: 3.6 mmol/L (ref 3.5–5.1)
Sodium: 144 mmol/L (ref 135–145)

## 2015-06-04 LAB — CBC WITH DIFFERENTIAL/PLATELET
Basophils Absolute: 0 K/uL (ref 0.0–0.1)
Basophils Relative: 0 %
Eosinophils Absolute: 0.3 K/uL (ref 0.0–0.7)
Eosinophils Relative: 3 %
HCT: 25.4 % — ABNORMAL LOW (ref 36.0–46.0)
Hemoglobin: 8.3 g/dL — ABNORMAL LOW (ref 12.0–15.0)
Lymphocytes Relative: 16 %
Lymphs Abs: 1.6 K/uL (ref 0.7–4.0)
MCH: 27.9 pg (ref 26.0–34.0)
MCHC: 32.7 g/dL (ref 30.0–36.0)
MCV: 85.2 fL (ref 78.0–100.0)
Monocytes Absolute: 1.1 K/uL — ABNORMAL HIGH (ref 0.1–1.0)
Monocytes Relative: 11 %
Neutro Abs: 7 K/uL (ref 1.7–7.7)
Neutrophils Relative %: 70 %
Platelets: 124 K/uL — ABNORMAL LOW (ref 150–400)
RBC: 2.98 MIL/uL — ABNORMAL LOW (ref 3.87–5.11)
RDW: 19 % — ABNORMAL HIGH (ref 11.5–15.5)
WBC: 9.9 K/uL (ref 4.0–10.5)

## 2015-06-04 LAB — GLUCOSE, CAPILLARY
GLUCOSE-CAPILLARY: 80 mg/dL (ref 65–99)
GLUCOSE-CAPILLARY: 85 mg/dL (ref 65–99)
Glucose-Capillary: 78 mg/dL (ref 65–99)

## 2015-06-04 LAB — CULTURE, BLOOD (SINGLE)

## 2015-06-04 LAB — URINE CULTURE: Culture: >=100000

## 2015-06-04 LAB — TROPONIN I: Troponin I: 1.27 ng/mL (ref <0.031)

## 2015-06-04 LAB — ECHOCARDIOGRAM COMPLETE
HEIGHTINCHES: 65 in
WEIGHTICAEL: 3365.1 [oz_av]

## 2015-06-04 LAB — HEMOGLOBIN AND HEMATOCRIT, BLOOD
HCT: 24.3 % — ABNORMAL LOW (ref 36.0–46.0)
Hemoglobin: 8.1 g/dL — ABNORMAL LOW (ref 12.0–15.0)

## 2015-06-04 LAB — MAGNESIUM: Magnesium: 1.8 mg/dL (ref 1.7–2.4)

## 2015-06-04 MED ORDER — LIP MEDEX EX OINT
TOPICAL_OINTMENT | CUTANEOUS | Status: DC | PRN
  Administered 2015-06-04 – 2015-06-08 (×2): via TOPICAL
  Administered 2015-06-14: 1 via TOPICAL
  Filled 2015-06-04: qty 7

## 2015-06-04 MED ORDER — VANCOMYCIN HCL 10 G IV SOLR
1250.0000 mg | INTRAVENOUS | Status: DC
  Filled 2015-06-04: qty 1250

## 2015-06-04 MED ORDER — MAGNESIUM SULFATE 2 GM/50ML IV SOLN
2.0000 g | Freq: Once | INTRAVENOUS | Status: AC
  Administered 2015-06-04: 2 g via INTRAVENOUS
  Filled 2015-06-04: qty 50

## 2015-06-04 MED ORDER — SODIUM CHLORIDE 0.9 % IV SOLN
2.0000 g | INTRAVENOUS | Status: DC
  Administered 2015-06-04 – 2015-06-06 (×13): 2 g via INTRAVENOUS
  Filled 2015-06-04 (×17): qty 2000

## 2015-06-04 MED ORDER — ALBUTEROL SULFATE (2.5 MG/3ML) 0.083% IN NEBU: 5.0000 mg | INHALATION_SOLUTION | RESPIRATORY_TRACT | Status: DC

## 2015-06-04 MED ORDER — POTASSIUM CHLORIDE 10 MEQ/50ML IV SOLN
10.0000 meq | INTRAVENOUS | Status: AC
  Administered 2015-06-04 (×4): 10 meq via INTRAVENOUS
  Filled 2015-06-04 (×4): qty 50

## 2015-06-04 MED ORDER — SIMVASTATIN 40 MG PO TABS
40.0000 mg | ORAL_TABLET | Freq: Every day | ORAL | Status: DC
  Administered 2015-06-04: 40 mg via ORAL
  Filled 2015-06-04: qty 1

## 2015-06-04 NOTE — Progress Notes (Signed)
Pharmacy Antibiotic Note  Jasmine Newman is a 82 y.o. female admitted on 06/01/2015 with GI bleed and hypotension .  Pharmacy is dosing vancomycin for 1 of 2 blood cultures (+) for enterococcus.  MD is dosing ceftriaxone presumably for pneumonia.  Per ID consult 4/4, could consider stopping antibiotics unless a second blood culture is positive or a bioprosthetic valve is present  Plan:  Increase vancomycin to 1250mg  IV q24h for improving SCr  Continue to follow culture data and renal function  Height: 5\' 5"  (165.1 cm) Weight: 215 lb 13.3 oz (97.9 kg) IBW/kg (Calculated) : 57  Temp (24hrs), Avg:97.8 F (36.6 C), Min:97.2 F (36.2 C), Max:98.1 F (36.7 C)   Recent Labs Lab 06/01/15 1125 06/01/15 1529 06/01/15 1915 06/01/15 2054 06/02/15 0034 06/02/15 1656 06/03/15 0255 06/04/15 0400 06/04/15 0420  WBC 11.1*  --  10.4  --  13.6*  --  10.1 9.9  --   CREATININE 2.50*  --   --   --  2.04* 1.72* 1.56*  --  1.38*  LATICACIDVEN  --  0.87  --  0.7 1.0  --   --   --   --     Estimated Creatinine Clearance: 37 mL/min (by C-G formula based on Cr of 1.38).    No Known Allergies  Antimicrobials this admission: 4/2 >> Unasyn >> 4/3 4/3 >> Vancomycin >> 4/3 >> Ceftriaxone >>  Dose adjustments this admission: 4/5: increase vancomycin to 1250mg  q24h for improved SCr  Microbiology results: 4/2 BCx: 1/2 Enterococcus (sens amp, vanc) 4/2 UCx: >= 100k Enterococcus (sens pending) 4/2 Sputum: ordered 4/2 MRSA PCR: neg  Thank you for allowing pharmacy to be a part of this patient's care.  Loralee PacasErin Alaura Schippers, PharmD, BCPS Pager: [phone] 06/04/2015 9:03 AM

## 2015-06-04 NOTE — Progress Notes (Signed)
PULMONARY / CRITICAL CARE MEDICINE   Name: Jasmine Newman MRN: [id number] DOB: 01/26/1932    ADMISSION DATE:  06/01/2015 CONSULTATION DATE:  06/01/15  REFERRING MD:  ED  CHIEF COMPLAINT:  GI bleed, hypotension  HISTORY OF PRESENT ILLNESS:   82 year old with past mental history of hypothyroidism, hypertension, hyperlipidemia, depression. Admitted today to the ED after she had a fall at home. She is watching the [facility] [employer] basketball game yesterday with her husband. When she stood up to use the bathroom her legs gave out and she fell on the right side. She hurt her leg and chest. She did not hit her head and there was no loss of consciousness. She on the floor the whole night. When her son arrived next morning she was found to be lying in a large pool of melanotic stool. In the ED she was found to be hypertensive with hemoglobin of 7.4 (baseline unknown). She continued to be hypotensive in spite of 2 L of fluid. GI evaluated the patient. PCCM called for admission.  SUBJECTIVE: Patient successfully extubated yesterday. Initially patient had gone off vasopressors but then resumed later in the day due to hypotension. Currently off vasopressors. Patient denies any chest pain or pressure. She denies any abdominal pain or nausea. Nurse reports she did have a bowel movement this morning without any blood or melena.  REVIEW OF SYSTEMS:  No subjective fever, chills, or sweats. No dyspnea or cough.  VITAL SIGNS: BP 89/45 mmHg  Pulse 51  Temp(Src) 98.1 F (36.7 C) (Core (Comment))  Resp 11  Ht  (1.651 m)  Wt 215 lb 13.3 oz (97.9 kg)  BMI 35.92 kg/m2  SpO2 100%  LMP  (LMP Unknown)  HEMODYNAMICS:    VENTILATOR SETTINGS: Vent Mode:  [-] CPAP;PSV FiO2 (%):  [30 %] 30 % PEEP:  [5 cmH20] 5 cmH20 Pressure Support:  [5 cmH20] 5 cmH20  INTAKE / OUTPUT: I/O last 3 completed shifts: In: 5616.2 [I.V.:4651.2; Blood:665; IV Piggyback:300] Out: 2320 [Urine:2320]  PHYSICAL EXAMINATION: General:   Elderly female. Awake. Alert. Husband at bedside. Neuro: Following commands. Grossly nonfocal. No meningismus. HEENT: Moist. His membranes. No scleral icterus. Pupils equal.  Cardiovascular:  Sinus rhythm with regular rate. No appreciable JVD.  Lungs:  Clear bilaterally to auscultation. Normal work of breathing on nasal cannula oxygen. Abdomen: Soft. Normal bowel sounds. Nondistended. Nontender. Integument:  Warm & dry. No rash on exposed skin.  LABS:  BMET  Recent Labs Lab 06/02/15 1656 06/03/15 0255 06/04/15 0420  NA 141 142 144  K 4.2 4.1 3.6  CL 111 114* 114*  CO2 BUN 40* 34* 22*  CREATININE 1.72* 1.56* 1.38*  GLUCOSE 136* 118* 92    Electrolytes  Recent Labs Lab 06/02/15 0034 06/02/15 1656 06/03/15 0255 06/04/15 0400 06/04/15 0420  CALCIUM 7.6* 7.5* 7.5*  --  7.8*  MG 2.0  --  1.9 1.8  --   PHOS 3.0  --  3.0  --  3.0    CBC  Recent Labs Lab 06/02/15 0034  06/03/15 0255  06/03/15 1800 06/03/15 2000 06/04/15 0400  WBC 13.6*  --  10.1  --   --   --  9.9  HGB 7.1*  < > 9.0*  < > 8.6* 8.4* 8.1*  8.3*  HCT 21.6*  < > 27.3*  < > 25.6* 25.4* 24.3*  25.4*  PLT 240  --  122*  --   --   --  124*  < > =  values in this interval not displayed.  Coag's  Recent Labs Lab 06/01/15 1125 06/03/15 1000  APTT 27 33  INR 1.07 1.27    Sepsis Markers  Recent Labs Lab 06/01/15 1529 06/01/15 2054 06/01/15 2100 06/02/15 0034  LATICACIDVEN 0.87 0.7  --  1.0  PROCALCITON  --   --  0.63  --     ABG  Recent Labs Lab 06/02/15 1710 06/03/15 0401  PHART 7.348* 7.332*  PCO2ART 38.2 40.0  PO2ART 396* 82.4    Liver Enzymes  Recent Labs Lab 06/01/15 1125 06/03/15 0255 06/04/15 0420  AST 25  --   --   ALT 19  --   --   ALKPHOS 64  --   --   BILITOT 0.5  --   --   ALBUMIN 2.4* 1.9* 2.0*    Cardiac Enzymes  Recent Labs Lab 06/02/15 1656 06/03/15 1800 06/04/15 0400  TROPONINI 3.27* 1.26* 1.27*    Glucose  Recent Labs Lab  06/03/15 0727 06/03/15 1149 06/03/15 1540 06/03/15 2002 06/03/15 2345 06/04/15 0355  GLUCAP 117* 111* 74 85 79 78    Imaging No results found.  STUDIES:  X ray rib 4/2: No fractures Port CXR 4/2:  Questionable right basal hazy opacity. R IJ CVL in mid-SVC. Chronic elevation left hemidiaphragm.  R Knee 2View 4/2: No fracture or dislocation. Port CXR 4/4:  Silhouetting of left hemidiaphragm unchanged. Endotracheal tube and central venous catheter in good position. No new focal opacity. TTE 4/4: LV normal in size with EF 45-50%. Akinesis of anterior septal myocardium. Grade 2 diastolic dysfunction. LA & RA normal in size. RV normal in size and function. PASP . Moderate aortic stenosis without regurgitation. Severe mitral vegetation without stenosis. Trivial pulmonic regurgitation without stenosis. Moderate tricuspid regurgitation.  CULTURES: Blood Ctx x2 4/2>>> Enterococcus 1/2 bottles Urine Ctx x2 4/2>>> Enterococcus 2/2 cultures MRSA PCR 4/2:  Negative   ANTIBIOTICS: Ampicillin 4/5>>> Vancomycin 4/3 - 4/5 Rocephin 4/3 - 4/5 Unasyn 4/2  SIGNIFICANT EVENTS: 4/2 - Admit 4/3 - Intubation & EGD 4/4 - Extubation  LINES/TUBES: R IJ CVL 4/2>> Foley 4/2>> PIV x1 OETT 4/3 - 4/4  ASSESSMENT / PLAN:  PULMONARY A: Possible RLL Pneumonia Acute Hypoxic Respiratory Failure - Likely secondary to anemia & IV blood/fluid resuscitation. Intubated for EGD 4/3 then extubated 4/4.  P:   Continuous pulse ox Wean for FiO2 >94%  CARDIOVASCULAR A:  Shock - Intermittent. Sepsis vs GIB. Moderate Aortic Stenosis / Severe Mitral Regurg - Seen on TTE 4/4. NSTEMI - Possible MI given wall motion abnormality on TTE 4/4. Atrial Fibrillation - Intermittent. H/O HTN H/O Hyperlipdemia H/O Bioprosthetic Valve - Porcine? Position?  P:  Weaning Neosynephrine to maintain MAP>65 No systemic anticoagulation given GIB Monitor on telemetry Vitals per unit protocol Holding Lisinopril &  Lopressor Zocor qhs Plan for Cardiology Consult once patient stabilizes  RENAL A:   Acute Renal Failure - Improving. Hypomagnesemia - Replacing.  Hypokalemia - Replacing.  P:   Magnesium Sulfate 2gm IV KCl IV x4 runs Trending UOP with Foley Monitoring daily renal function & electrolytes Replacing electrolytes as indicated  GASTROINTESTINAL A:   Acute GIB - Secondary to Duodenal Ulcer on EGD 4/3.  P:   GI Following Protonix gtt & transition to Protonix IV q12hr later tonight Clear Liquid Diet  HEMATOLOGIC A:   Anemia - Secondary to GIB. S/P 7u PRBC. Hgb Stable. Thrombocytopenia - Mild. Likely consumption.   P:  Trending Hgb/Hct q12hr Transfuse for Hgb <8.0  Trending remaining cell counts daily w/ CBC SCDs  INFECTIOUS A:   Sepsis - Secondary to UTI & Bacteremia Enterococcus Bacteremia Enterococcus UTI  P:   Cultures pending Rocephin Day #4 total antibiotics (previously Unasyn for 1 day) & Vancomycin Day #3 Switch to Ampicillin today given concern for bioprosthetic valve Repeat Blood Culture today Procalcitonin per algorithm  ENDOCRINE A:   Questionable Adrenal Insufficiency H/O Hypothyroidism  P:   Synthroid IV daily Accu-Checks q4hr SSI per algorithm Holding on steroid therapy at this time given duodenal ulcer  NEUROLOGIC A:   Pain in Right Knee - No fracture of X-ray. H/O Depression  P:   Goal RASS:  0 Fentanyl gtt on hold Holding Wellbutrin XL & Cymbalta Holding pain medication   FAMILY  - Updates: Husband updated at bedside 4/5 by Dr. Jamison NeighborNestor - Inter-disciplinary family meet or Palliative Care meeting due by:  4/9  TODAY'S SUMMARY:  A 82 year old presenting with fall, ongoing hypotension, & GI Bleed. Found to have duodenal ulcer on EGD. Hemoglobin stable. Without further signs of active bleeding and normal bowel movement today I am advancing diet. Switching antibiotics over to ampicillin given Enterococcus seen on urine culture 2 and  blood culture 1 out of 2 bottles. Spoke with Dr. Jens Somrenshaw cardiology is going to be review the patient's echocardiogram and, and on the presence or absence of a bioprosthetic valve.  I have spent a total of 36 minutes of critical care time today caring for the patient and reviewing the patient's electronic medical record.   Donna ChristenJennings E. Jamison NeighborNestor, M.D. [facility] Pager:  [phone] After 3pm or if no response, call [phone]  06/04/2015, 9:06 AM

## 2015-06-04 NOTE — Progress Notes (Signed)
[facility] Progress Note  Subjective: The patient without abdominal pain one small stool with no visible blood tolerating clear liquid diet  Objective: Vital signs in last 24 hours: Temp:  [97.2 F (36.2 C)-98.1 F (36.7 C)] 97.7 F (36.5 C) (04/05 0900) Pulse Rate:  [48-76] 52 (04/05 0900) Resp:  [9-28] 12 (04/05 0900) BP: (66-131)/(28-81) 101/44 mmHg (04/05 0800) SpO2:  [94 %-100 %] 99 % (04/05 0900) FiO2 (%):  [30 %] 30 % (04/04 1000) Weight:  [97.9 kg (215 lb 13.3 oz)] 97.9 kg (215 lb 13.3 oz) (04/05 0500) Weight change: 2.5 kg (5 lb 8.2 oz)   PE: Unchanged  Lab Results: Results for orders placed or performed during the hospital encounter of 06/01/15 (from the past 24 hour(s))  Protime-INR     Status: Abnormal   Collection Time: 06/03/15 10:00 AM  Result Value Ref Range   Prothrombin Time 15.5 (H) 11.6 - 15.2 seconds   INR 1.27 0.00 - 1.49  APTT     Status: None   Collection Time: 06/03/15 10:00 AM  Result Value Ref Range   aPTT 33 24 - 37 seconds  Fibrinogen     Status: None   Collection Time: 06/03/15 10:00 AM  Result Value Ref Range   Fibrinogen 281 204 - 475 mg/dL  Glucose, capillary     Status: Abnormal   Collection Time: 06/03/15 11:49 AM  Result Value Ref Range   Glucose-Capillary 111 (H) 65 - 99 mg/dL  Hemoglobin and hematocrit, blood     Status: Abnormal   Collection Time: 06/03/15 12:26 PM  Result Value Ref Range   Hemoglobin 8.7 (L) 12.0 - 15.0 g/dL   HCT 40.125.8 (L) 02.736.0 - 25.346.0 %  Glucose, capillary     Status: None   Collection Time: 06/03/15  3:40 PM  Result Value Ref Range   Glucose-Capillary 74 65 - 99 mg/dL  Hemoglobin and hematocrit, blood     Status: Abnormal   Collection Time: 06/03/15  6:00 PM  Result Value Ref Range   Hemoglobin 8.6 (L) 12.0 - 15.0 g/dL   HCT 66.425.6 (L) 40.336.0 - 47.446.0 %  Troponin I (q 6hr x 3)     Status: Abnormal   Collection Time: 06/03/15  6:00 PM  Result Value Ref Range   Troponin I 1.26 (HH) <0.031 ng/mL   Hemoglobin and hematocrit, blood     Status: Abnormal   Collection Time: 06/03/15  8:00 PM  Result Value Ref Range   Hemoglobin 8.4 (L) 12.0 - 15.0 g/dL   HCT 25.925.4 (L) 56.336.0 - 87.546.0 %  Glucose, capillary     Status: None   Collection Time: 06/03/15  8:02 PM  Result Value Ref Range   Glucose-Capillary 85 65 - 99 mg/dL   Comment 1 Notify RN    Comment 2 Document in Chart   Glucose, capillary     Status: None   Collection Time: 06/03/15 11:45 PM  Result Value Ref Range   Glucose-Capillary 79 65 - 99 mg/dL  Glucose, capillary     Status: None   Collection Time: 06/04/15  3:55 AM  Result Value Ref Range   Glucose-Capillary 78 65 - 99 mg/dL  Hemoglobin and hematocrit, blood     Status: Abnormal   Collection Time: 06/04/15  4:00 AM  Result Value Ref Range   Hemoglobin 8.1 (L) 12.0 - 15.0 g/dL   HCT 64.324.3 (L) 32.936.0 - 51.846.0 %  CBC with Differential/Platelet     Status: Abnormal  Collection Time: 06/04/15  4:00 AM  Result Value Ref Range   WBC 9.9 4.0 - 10.5 K/uL   RBC 2.98 (L) 3.87 - 5.11 MIL/uL   Hemoglobin 8.3 (L) 12.0 - 15.0 g/dL   HCT 16.1 (L) 09.6 - 04.5 %   MCV 85.2 78.0 - 100.0 fL   MCH 27.9 26.0 - 34.0 pg   MCHC 32.7 30.0 - 36.0 g/dL   RDW 40.9 (H) 81.1 - 91.4 %   Platelets 124 (L) 150 - 400 K/uL   Neutrophils Relative % 70 %   Neutro Abs 7.0 1.7 - 7.7 K/uL   Lymphocytes Relative 16 %   Lymphs Abs 1.6 0.7 - 4.0 K/uL   Monocytes Relative 11 %   Monocytes Absolute 1.1 (H) 0.1 - 1.0 K/uL   Eosinophils Relative 3 %   Eosinophils Absolute 0.3 0.0 - 0.7 K/uL   Basophils Relative 0 %   Basophils Absolute 0.0 0.0 - 0.1 K/uL  Magnesium     Status: None   Collection Time: 06/04/15  4:00 AM  Result Value Ref Range   Magnesium 1.8 1.7 - 2.4 mg/dL  Troponin I     Status: Abnormal   Collection Time: 06/04/15  4:00 AM  Result Value Ref Range   Troponin I 1.27 (HH) <0.031 ng/mL  Renal function panel     Status: Abnormal   Collection Time: 06/04/15  4:20 AM  Result Value Ref Range    Sodium 144 135 - 145 mmol/L   Potassium 3.6 3.5 - 5.1 mmol/L   Chloride 114 (H) 101 - 111 mmol/L   CO2 24 22 - 32 mmol/L   Glucose, Bld 92 65 - 99 mg/dL   BUN 22 (H) 6 - 20 mg/dL   Creatinine, Ser 7.82 (H) 0.44 - 1.00 mg/dL   Calcium 7.8 (L) 8.9 - 10.3 mg/dL   Phosphorus 3.0 2.5 - 4.6 mg/dL   Albumin 2.0 (L) 3.5 - 5.0 g/dL   GFR calc non Af Amer 35 (L) >60 mL/min   GFR calc Af Amer 40 (L) >60 mL/min   Anion gap 6 5 - 15    Studies/Results: Dg Chest Port 1 View  06/03/2015  CLINICAL DATA:  Intubation. EXAM: EXAM PORTABLE CHEST 1 VIEW COMPARISON:  06/02/2015. FINDINGS: Endotracheal tube is been partially withdrawn, its tip is 3.4 cm above the carina. Right IJ line stable position. Mediastinum and hilar structures normal. Prior median sternotomy. Heart size normal. Low lung volumes with mild bibasilar atelectasis and/or infiltrates. Tiny left pleural effusion cannot be excluded. No pneumothorax . IMPRESSION: 1. Endotracheal tube is been withdrawn in good anatomic position. Its tip is 3.4 cm above carina. Right IJ line stable position. 2. Stable mild bibasilar atelectasis and/or infiltrates. Tiny left pleural effusion cannot be excluded. Electronically Signed   By: Maisie Fus  Register   On: 06/03/2015 07:16   Dg Chest Port 1 View  06/02/2015  CLINICAL DATA:  Respiratory failure and endotracheal tube placement. EXAM: PORTABLE CHEST 1 VIEW COMPARISON:  06/01/2015 FINDINGS: The heart size and mediastinal contours are within normal limits. An endotracheal tube is been placed. The tip lies in the right mainstem bronchus and the tube should be pulled back at least 3-4 cm. Lungs show bibasilar atelectasis and potentially a component of bibasilar infiltrates. No edema, pneumothorax or significant pleural fluid identified. Stable appearance of right jugular central line with catheter tip in the SVC. The visualized skeletal structures are unremarkable. IMPRESSION: Endotracheal tube tip lies in the right mainstem  bronchus and the tube should be pull-back roughly 3-4 cm. These results will be called to the ordering clinician or representative by the Radiologist Assistant, and communication documented in the PACS or zVision Dashboard. Electronically Signed   By: Irish Lack M.D.   On: 06/02/2015 16:45      Assessment: Duodenal ulcer with hemorrhage clinically stable  Plan: Continue to monitor stools and hemoglobin. Continue PPI. Await CLOtest. Advance diet as tolerated  Starasia Sinko C 06/04/2015, 9:36 AM  Pager [phone] If no answer or after 5 PM call [phone]

## 2015-06-04 NOTE — Consult Note (Signed)
[facility]       Reason for Consult: Enterococcus in blood    Referring Physician: CHAMP  Active Problems:   GI bleed   Pressure ulcer   . ampicillin (OMNIPEN) IV  2 g Intravenous 6 times per day  . antiseptic oral rinse  7 mL Mouth Rinse q12n4p  . chlorhexidine  15 mL Mouth Rinse BID  . insulin aspart  0-9 Units Subcutaneous 6 times per day  . levothyroxine  25 mcg Intravenous QAC breakfast  . pantoprazole (PROTONIX) IV  40 mg Intravenous Q12H  . simvastatin  40 mg Oral q1800    Recommendations: Continue with ampicillin Can change to oral amoxicillin 500 mg three times a day at discharge Complete 14 days   Assessment: She has 1/2 Enterococcal blood culture and urine also with the same, ampicillin sensitive Enterococcus.  Unclear if this is transient from GI system or urinary.  She does though have a bioprosthetic valve and always of concern for infective endocarditis.  With only one positive though and urinary culture positive, I feel it is a low likelihood of endocariditis as long as just the one is positive. I appreciate verifying that she did have a bioprosthetic valve per Dr. Jamison NeighborNestor and Jens Somrenshaw.   Repeat blood cultures sent.   Antibiotics: Vancomycin/ceftriaxone Ampicillin day 1  HPI: Jasmine Newman is a 82 y.o. female with history of bioprosthetic aortic valve replacement who presented 4/2 after a fall at home and was unable to get up.  She was found in the am by her son in stool with blood and Hgb of 7.4 on admission.  She was hypotensive and admitted.  GI evaluation noted a duodenal ulcer as the source.  She also has grown Enterococcus, as above.  No recent illnesses.    Review of Systems:  Constitutional: negative for fevers and chills Gastrointestinal: negative for nausea and vomiting All other systems reviewed and are negative   Past Medical History  Diagnosis Date  . Hypertension     Social History  Substance Use  Topics  . Smoking status: Former Games developermoker  . Smokeless tobacco: None  . Alcohol Use: None    History reviewed. No pertinent family history.  No Known Allergies  Physical Exam: Constitutional: in no apparent distress and alert  Filed Vitals:   06/04/15 1500 06/04/15 1600  BP: 123/50   Pulse: 60 63  Temp: 98.1 F (36.7 C) 98.2 F (36.8 C)  Resp: 15 14   EYES: anicteric ENMT: no thrush Cardiovascular: Cor RRR Respiratory: CTA B; normal respiratory effort GI: Bowel sounds are normal, liver is not enlarged, spleen is not enlarged Musculoskeletal: no pedal edema noted Skin: negatives: no rash Neuro: non focal  Lab Results  Component Value Date   WBC 9.9 06/04/2015   HGB 8.1* 06/04/2015   HGB 8.3* 06/04/2015   HCT 24.3* 06/04/2015   HCT 25.4* 06/04/2015   MCV 85.2 06/04/2015   PLT 124* 06/04/2015    Lab Results  Component Value Date   CREATININE 1.38* 06/04/2015   BUN 22* 06/04/2015   NA 144 06/04/2015   K 3.6 06/04/2015   CL 114* 06/04/2015   CO2 24 06/04/2015    Lab Results  Component Value Date   ALT 19 06/01/2015   AST 25 06/01/2015   ALKPHOS 64 06/01/2015     Microbiology: Recent Results (from the past 240 hour(s))  Urine culture     Status: None (Preliminary result)   Collection  Time: 06/01/15 11:37 AM  Result Value Ref Range Status   Specimen Description URINE, CATHETERIZED  Final   Special Requests NONE  Final   Culture   Final    >=100,000 COLONIES/mL ENTEROCOCCUS SPECIES SENT TO REFERENCE LAB FOR SENSITIVITIES Performed at [facility]    Report Status PENDING  Incomplete  Culture, blood (single)     Status: None   Collection Time: 06/01/15  3:13 PM  Result Value Ref Range Status   Specimen Description BLOOD RIGHT ARM  Final   Special Requests BOTTLES DRAWN AEROBIC AND ANAEROBIC 7CC  Final   Culture  Setup Time   Final    IN BOTH AEROBIC AND ANAEROBIC BOTTLES GRAM POSITIVE COCCI IN PAIRS IN CHAINS CRITICAL RESULT CALLED TO, READ  BACK BY AND VERIFIED WITH: M.SCHRAMM,RN 1610 06/02/15 M.CAMPBELL    Culture   Final    ENTEROCOCCUS SPECIES Performed at [facility]    Report Status 06/04/2015 FINAL  Final   Organism ID, Bacteria ENTEROCOCCUS SPECIES  Final      Susceptibility   Enterococcus species - MIC*    AMPICILLIN <=2 SENSITIVE Sensitive     VANCOMYCIN 1 SENSITIVE Sensitive     GENTAMICIN SYNERGY SENSITIVE Sensitive     * ENTEROCOCCUS SPECIES  Urine culture     Status: None   Collection Time: 06/01/15  4:30 PM  Result Value Ref Range Status   Specimen Description URINE, CATHETERIZED  Final   Special Requests NONE  Final   Culture   Final    >=100,000 COLONIES/mL ENTEROCOCCUS SPECIES Performed at [facility]    Report Status 06/04/2015 FINAL  Final   Organism ID, Bacteria ENTEROCOCCUS SPECIES  Final      Susceptibility   Enterococcus species - MIC*    AMPICILLIN <=2 SENSITIVE Sensitive     LEVOFLOXACIN 1 SENSITIVE Sensitive     NITROFURANTOIN 32 SENSITIVE Sensitive     VANCOMYCIN 1 SENSITIVE Sensitive     * >=100,000 COLONIES/mL ENTEROCOCCUS SPECIES  MRSA PCR Screening     Status: None   Collection Time: 06/01/15  4:37 PM  Result Value Ref Range Status   MRSA by PCR NEGATIVE NEGATIVE Final    Comment:        The GeneXpert MRSA Assay (FDA approved for NASAL specimens only), is one component of a comprehensive MRSA colonization surveillance program. It is not intended to diagnose MRSA infection nor to guide or monitor treatment for MRSA infections.   Culture, blood (routine x 2)     Status: None (Preliminary result)   Collection Time: 06/01/15  8:51 PM  Result Value Ref Range Status   Specimen Description BLOOD BLOOD LEFT HAND  Final   Special Requests BOTTLES DRAWN AEROBIC ONLY 4CC  Final   Culture   Final    NO GROWTH 2 DAYS Performed at [facility]    Report Status PENDING  Incomplete    Staci Righter, MD [facility] [email or web address] [phone] pager  [phone] cell 06/04/2015, 4:36 PM

## 2015-06-05 ENCOUNTER — Encounter (HOSPITAL_COMMUNITY)
Admission: EM | Disposition: A | Discharge status: Skilled Nursing Facility | Payer: Self-pay | Source: Home / Self Care | Attending: Pulmonary Disease

## 2015-06-05 ENCOUNTER — Inpatient Hospital Stay (HOSPITAL_COMMUNITY): Payer: Medicare Other

## 2015-06-05 LAB — CBC WITH DIFFERENTIAL/PLATELET
BASOS PCT: 0 %
Basophils Absolute: 0 10*3/uL (ref 0.0–0.1)
EOS ABS: 0.2 10*3/uL (ref 0.0–0.7)
EOS PCT: 3 %
HCT: 24.6 % — ABNORMAL LOW (ref 36.0–46.0)
HEMOGLOBIN: 7.9 g/dL — AB (ref 12.0–15.0)
LYMPHS PCT: 20 %
Lymphs Abs: 1.5 10*3/uL (ref 0.7–4.0)
MCH: 28.3 pg (ref 26.0–34.0)
MCHC: 32.1 g/dL (ref 30.0–36.0)
MCV: 88.2 fL (ref 78.0–100.0)
Monocytes Absolute: 0.9 10*3/uL (ref 0.1–1.0)
Monocytes Relative: 12 %
NEUTROS ABS: 4.9 10*3/uL (ref 1.7–7.7)
Neutrophils Relative %: 65 %
Platelets: 119 10*3/uL — ABNORMAL LOW (ref 150–400)
RBC: 2.79 MIL/uL — ABNORMAL LOW (ref 3.87–5.11)
RDW: 19.5 % — ABNORMAL HIGH (ref 11.5–15.5)
WBC: 7.5 10*3/uL (ref 4.0–10.5)

## 2015-06-05 LAB — GLUCOSE, CAPILLARY
Glucose-Capillary: 125 mg/dL — ABNORMAL HIGH (ref 65–99)
Glucose-Capillary: 105 mg/dL — ABNORMAL HIGH (ref 65–99)
Glucose-Capillary: 86 mg/dL (ref 65–99)
Glucose-Capillary: 92 mg/dL (ref 65–99)

## 2015-06-05 LAB — HEMOGLOBIN AND HEMATOCRIT, BLOOD
HCT: 22.1 % — ABNORMAL LOW (ref 36.0–46.0)
HEMATOCRIT: 20.2 % — AB (ref 36.0–46.0)
HEMOGLOBIN: 6.8 g/dL — AB (ref 12.0–15.0)
Hemoglobin: 7.5 g/dL — ABNORMAL LOW (ref 12.0–15.0)

## 2015-06-05 LAB — PREPARE RBC (CROSSMATCH)

## 2015-06-05 LAB — RENAL FUNCTION PANEL
ANION GAP: 5 (ref 5–15)
Albumin: 1.9 g/dL — ABNORMAL LOW (ref 3.5–5.0)
BUN: 16 mg/dL (ref 6–20)
CHLORIDE: 114 mmol/L — AB (ref 101–111)
CO2: 25 mmol/L (ref 22–32)
Calcium: 7.7 mg/dL — ABNORMAL LOW (ref 8.9–10.3)
Creatinine, Ser: 1.36 mg/dL — ABNORMAL HIGH (ref 0.44–1.00)
GFR calc non Af Amer: 35 mL/min — ABNORMAL LOW (ref >60)
GFR, EST AFRICAN AMERICAN: 41 mL/min — AB (ref >60)
GLUCOSE: 98 mg/dL (ref 65–99)
Phosphorus: 2.6 mg/dL (ref 2.5–4.6)
Potassium: 3.8 mmol/L (ref 3.5–5.1)
SODIUM: 144 mmol/L (ref 135–145)

## 2015-06-05 LAB — H. PYLORI ANTIBODY, IGG: H Pylori IgG: <0.9 U/mL (ref 0.0–0.8)

## 2015-06-05 LAB — MAGNESIUM: Magnesium: 2.1 mg/dL (ref 1.7–2.4)

## 2015-06-05 SURGERY — EGD (ESOPHAGOGASTRODUODENOSCOPY)
Anesthesia: Monitor Anesthesia Care | Laterality: Left

## 2015-06-05 MED ORDER — IOPAMIDOL (ISOVUE-300) INJECTION 61%
50.0000 mL | Freq: Once | INTRAVENOUS | Status: AC | PRN
  Administered 2015-06-05: 50 mL via INTRA_ARTERIAL

## 2015-06-05 MED ORDER — FENTANYL CITRATE (PF) 100 MCG/2ML IJ SOLN
INTRAMUSCULAR | Status: DC | PRN
  Administered 2015-06-05: 50 ug via INTRAVENOUS

## 2015-06-05 MED ORDER — FENTANYL CITRATE (PF) 100 MCG/2ML IJ SOLN
INTRAMUSCULAR | Status: AC
  Filled 2015-06-05: qty 2

## 2015-06-05 MED ORDER — LIDOCAINE HCL 1 % IJ SOLN
INTRAMUSCULAR | Status: AC | PRN
  Administered 2015-06-05: 10 mL

## 2015-06-05 MED ORDER — SODIUM CHLORIDE 0.9 % IV SOLN
Freq: Once | INTRAVENOUS | Status: AC
  Administered 2015-06-05: 10:00:00 via INTRAVENOUS

## 2015-06-05 MED ORDER — PANTOPRAZOLE SODIUM 40 MG IV SOLR
40.0000 mg | Freq: Two times a day (BID) | INTRAVENOUS | Status: DC
Start: 2015-06-08 — End: 2015-06-06

## 2015-06-05 MED ORDER — SODIUM CHLORIDE 0.9 % IV SOLN
8.0000 mg/h | INTRAVENOUS | Status: AC
  Administered 2015-06-05 – 2015-06-08 (×5): 8 mg/h via INTRAVENOUS
  Filled 2015-06-05 (×13): qty 80

## 2015-06-05 MED ORDER — ONDANSETRON HCL 4 MG/2ML IJ SOLN
4.0000 mg | INTRAMUSCULAR | Status: DC | PRN
Start: 2015-06-05 — End: 2015-06-20
  Administered 2015-06-05 – 2015-06-15 (×28): 4 mg via INTRAVENOUS
  Filled 2015-06-05 (×30): qty 2

## 2015-06-05 MED ORDER — LIDOCAINE HCL 1 % IJ SOLN
INTRAMUSCULAR | Status: AC
  Filled 2015-06-05: qty 20

## 2015-06-05 MED ORDER — SODIUM CHLORIDE 0.9 % IV SOLN
Freq: Once | INTRAVENOUS | Status: AC
  Administered 2015-06-05: 21:00:00 via INTRAVENOUS

## 2015-06-05 MED ORDER — SODIUM CHLORIDE 0.9 % IV SOLN: INTRAVENOUS | Status: DC

## 2015-06-05 NOTE — Progress Notes (Signed)
eLink Physician-Brief Progress Note Patient Name: Jasmine PonsBetty G Hodak DOB: October 18, 1931 MRN: [id number]   Date of Service  06/05/2015  HPI/Events of Note  Large maroon stool this AM, about 75-100c.  eICU Interventions  Cont to monitor,  H/H as needed.      Intervention Category Intermediate Interventions: Other:  Alain Deschene 06/05/2015, 6:40 AM

## 2015-06-05 NOTE — Progress Notes (Signed)
eLink Physician-Brief Progress Note Patient Name: Jasmine PonsBetty G Puerta DOB: 1932/02/24 MRN: [id number]   Date of Service  06/05/2015  HPI/Events of Note  Anemia - Hgb = 6.8. Order already written to transfuse 1 unit PRBC.   eICU Interventions  Continue present management.      Intervention Category Major Interventions: Other:  Lenell AntuSommer,Giordan Fordham Eugene 06/05/2015, 9:06 PM

## 2015-06-05 NOTE — Progress Notes (Signed)
CRITICAL VALUE ALERT  Critical value received:  Hgb 6.8  Date of notification:  06/05/15  Time of notification:  2100  Critical value read back:Yes.    Nurse who received alert:  Rutha BouchardShelby Rielle Schlauch, RN  MD notified (1st page):  Dr. Arsenio LoaderSommer, E-link  Time of first page:  2104  MD notified (2nd page):  Time of second page:  Responding MD:  Dr. Dellie CatholicSommers  Time MD responded:  2106

## 2015-06-05 NOTE — Procedures (Signed)
Successful GDA microcoil embo for active duodenal bleeding ulcer No comp Stable Full report in PACS

## 2015-06-05 NOTE — Consult Note (Signed)
WOC wound consult note Reason for Consult:Stage 3 pressure injury to left lateral heel, present on admission.  Spouse at bedside and states he has been treating at home with Neosporin for some time with little improvement.  Discussed need to offload pressure as patient's foot turns out when in bed.  Demonstrated how to float heels with a pillow under her calf.  Will also order a Prevalon boot that they can use at home.   Wound type:Stage 3 pressure injury Pressure Ulcer POA: Yes Measurement: 4 cm x 3 cm x 0.2 cm Wound UJW:JXBJbed:Pale pink, moist Drainage (amount, consistency, odor) Minimal serosanguinous  No odor.  Periwound:Intact Left heel boggy  Dressing procedure/placement/frequency:Cleanse wound to left lateral foot with NS and pat gently dry.  Apply NS moist gauze to wound bed. Cover with 4x4 gauze and kerlix/tape.  Change daily.  Prevalon boot while in bed.   Will not follow at this time.  Please re-consult if needed.  Maple HudsonKaren Jonni Oelkers RN BSN CWON Pager [phone]

## 2015-06-05 NOTE — Progress Notes (Addendum)
eLink Physician-Brief Progress Note Patient Name: Jasmine Newman DOB: February 23, 1932 MRN: [id number]   Date of Service  06/05/2015  HPI/Events of Note  Blood cultures are positive for GPC in pairs and chains. ID is following the patient and is aware of positive blood cultures for Enterococcus and has elected to continue Ampicillin IV.   eICU Interventions  Continue current management.      Intervention Category Major Interventions: Infection - evaluation and management  Ehab Humber Eugene 06/05/2015, 7:27 PM

## 2015-06-05 NOTE — Progress Notes (Signed)
Several stools BRB with clots, Dr Madilyn Firemanhayes notified also vom. Small amount of watery bright red blood.

## 2015-06-05 NOTE — Sedation Documentation (Signed)
375fr sheath removed from right fem artery by Dr. Miles CostainShick. Hemostasis achieved using manual pressure held by Dr. Miles CostainShick and Merlene MorseKathy Mazepa, RTR for 15 minutes. Groin level 0, RDP +1.

## 2015-06-05 NOTE — Progress Notes (Signed)
Date:  June 05, 2015 Chart reviewed for concurrent status and case management needs. Will continue to follow patient for changes and needs:  Hypotensive iv neo drip ongoing. Marcelle Smilinghonda Sarenity Ramaker, BSN, RN, [place]   [phone]

## 2015-06-05 NOTE — Consult Note (Signed)
Chief Complaint: Patient was seen in consultation today for mesenteric/visceral arteriogram with possible embolization Chief Complaint  Patient presents with  . Hypotension  . Fall  . GI Bleeding     Referring Physician(s): Hayes,John  Supervising Physician: Ruel Favors  History of Present Illness: Jasmine Newman is a 82 y.o. female with past medical history significant for hypertension, hypothyroidism, hyperlipidemia, and depression who was admitted to the hospital on 4/2 following syncopal episode at home. No fractures were noted on imaging. She was subsequently noted to have bloody stools and anemia. In addition she developed atrial fibrillation as well as elevated troponins, possibly from demand ischemia. Renal insufficiency was also present. Due to hypotension and elevated troponins endoscopy was not performed immediately patient was subsequently intubated on 4/3. Following intubation the patient subsequently underwent EGD which showed a very large duodenal bulb ulcer with adherent clot. Epinephrine was injected around the site. Since that time the patient has continued to have bloody stools with occasional emesis who has nausea and intermittent abdominal/back pain. Current labs included WBC 7.5, hemoglobin 7.9, platelets 119 K, creatinine 1.36, PT 15.5, INR 1.27. She is now extubated. Blood pressure remains soft and patient is awaiting additional transfusions. Request now received from GI service for visceral/mesenteric arteriogram with possible embolization.  Past Medical History  Diagnosis Date  . Hypertension     Past Surgical History  Procedure Laterality Date  . Esophagogastroduodenoscopy N/A 06/02/2015    Procedure: ESOPHAGOGASTRODUODENOSCOPY (EGD);  Surgeon: Graylin Shiver, MD;  Location: [facility];  Service: Endoscopy;  Laterality: N/A;    Allergies: Review of patient's allergies indicates no known allergies.  Medications: Prior to Admission medications     Medication Sig Start Date End Date Taking? Authorizing Provider  buPROPion (WELLBUTRIN XL) 150 MG 24 hr tablet Take 1 tablet (150 mg total) by mouth daily. Patient not taking: Reported on 06/01/2015 06/28/11   Dois Davenport, MD  DULoxetine (CYMBALTA) 60 MG capsule Take 1 capsule (60 mg total) by mouth daily. Patient not taking: Reported on 06/01/2015 06/28/11   Dois Davenport, MD  levothyroxine (SYNTHROID, LEVOTHROID) 50 MCG tablet Take 1 tablet (50 mcg total) by mouth daily. Patient not taking: Reported on 06/01/2015 06/28/11   Dois Davenport, MD  lisinopril (PRINIVIL,ZESTRIL) 5 MG tablet Take 1 tablet (5 mg total) by mouth daily. Patient not taking: Reported on 06/01/2015 06/28/11   Dois Davenport, MD  metoprolol (LOPRESSOR) 50 MG tablet Take 1 tablet (50 mg total) by mouth 2 (two) times daily. Patient not taking: Reported on 06/01/2015 06/28/11   Dois Davenport, MD  simvastatin (ZOCOR) 40 MG tablet Take 1 tablet (40 mg total) by mouth every evening. Patient not taking: Reported on 06/01/2015 06/28/11   Dois Davenport, MD     History reviewed. No pertinent family history.  Social History   Social History  . Marital Status: Single    Spouse Name: N/A  . Number of Children: N/A  . Years of Education: N/A   Social History Main Topics  . Smoking status: Former Games developer  . Smokeless tobacco: None  . Alcohol Use: None  . Drug Use: None  . Sexual Activity: Not Asked   Other Topics Concern  . None   Social History Narrative      Review of Systems see above; currently denies headache, substernal chest pain,or increasing dyspnea  Vital Signs: BP 79/58 mmHg  Pulse 75  Temp(Src) 98.1 F (36.7 C) (Core (Comment))  Resp  20  Ht  (1.651 m)  Wt 217 lb 13 oz (98.8 kg)  BMI 36.25 kg/m2  SpO2 100%  LMP  (LMP Unknown)  Physical Exam pale appearing elderly white female complaining of some mild abdominal and back discomfort as well as melanotic stools and intermittent nausea. Chest with  diminished breath sounds at bases. Heart with a regular rhythm but regular rate. Abdomen soft, positive bowel sounds, mild generalized tenderness to palpation. Lower extremity edema persists. Scattered ecchymotic areas on both upper extremities  Mallampati Score:  MD Evaluation Airway: Other (comments) Heart: WNL Abdomen: WNL Chest/ Lungs: WNL Other Pertinent Findings: intubated ASA  Classification: 3  Imaging: Dg Ribs Unilateral W/chest Right  06/01/2015  CLINICAL DATA:  Right rib pain after fall last night at home. EXAM: RIGHT RIBS AND CHEST - 3+ VIEW COMPARISON:  January 08, 2005. FINDINGS: No fracture or other bone lesions are seen involving the ribs. There is no evidence of pneumothorax or pleural effusion. Both lungs are clear. Heart size and mediastinal contours are within normal limits. IMPRESSION: Normal right ribs.  No acute cardiopulmonary abnormality seen. Electronically Signed   By: Lupita Raider, M.D.   On: 06/01/2015 11:55   Dg Knee 2 Views Right  06/01/2015  CLINICAL DATA:  Right knee pain today, fall last night. EXAM: RIGHT KNEE - 1-2 VIEW COMPARISON:  None. FINDINGS: Severe tricompartmental DJD with complete loss of joint space throughout and associated osteophyte formation. There is diffuse osseous demineralization/ osteopenia. No fracture line or displaced fracture fragment seen. No appreciable joint effusion. Adjacent soft tissues are unremarkable. Sclerotic focus within the distal femur shaft is incompletely imaged but most likely benign and favored to be chronic bone infarct. IMPRESSION: 1. No acute findings.  No osseous fracture or dislocation. 2. Severe tricompartmental DJD. Electronically Signed   By: Bary Richard M.D.   On: 06/01/2015 15:38   Dg Chest Port 1 View  06/03/2015  CLINICAL DATA:  Intubation. EXAM: EXAM PORTABLE CHEST 1 VIEW COMPARISON:  06/02/2015. FINDINGS: Endotracheal tube is been partially withdrawn, its tip is 3.4 cm above the carina. Right IJ line  stable position. Mediastinum and hilar structures normal. Prior median sternotomy. Heart size normal. Low lung volumes with mild bibasilar atelectasis and/or infiltrates. Tiny left pleural effusion cannot be excluded. No pneumothorax . IMPRESSION: 1. Endotracheal tube is been withdrawn in good anatomic position. Its tip is 3.4 cm above carina. Right IJ line stable position. 2. Stable mild bibasilar atelectasis and/or infiltrates. Tiny left pleural effusion cannot be excluded. Electronically Signed   By: Maisie Fus  Register   On: 06/03/2015 07:16   Dg Chest Port 1 View  06/02/2015  CLINICAL DATA:  Respiratory failure and endotracheal tube placement. EXAM: PORTABLE CHEST 1 VIEW COMPARISON:  06/01/2015 FINDINGS: The heart size and mediastinal contours are within normal limits. An endotracheal tube is been placed. The tip lies in the right mainstem bronchus and the tube should be pulled back at least 3-4 cm. Lungs show bibasilar atelectasis and potentially a component of bibasilar infiltrates. No edema, pneumothorax or significant pleural fluid identified. Stable appearance of right jugular central line with catheter tip in the SVC. The visualized skeletal structures are unremarkable. IMPRESSION: Endotracheal tube tip lies in the right mainstem bronchus and the tube should be pull-back roughly 3-4 cm. These results will be called to the ordering clinician or representative by the Radiologist Assistant, and communication documented in the PACS or zVision Dashboard. Electronically Signed   By: Rudene Anda.D.  On: 06/02/2015 16:45   Dg Chest Portable 1 View  06/01/2015  CLINICAL DATA:  Central line placement EXAM: PORTABLE CHEST 1 VIEW COMPARISON:  Chest x-rays dated 06/01/2015 01/08/2005. FINDINGS: Mild cardiomegaly is stable. Median sternotomy wires appear intact and stable in alignment. Right IJ central line has been placed with tip adequately positioned at the level of the mid SVC. No pneumothorax. Lungs are  clear. IMPRESSION: Right IJ central line placement with tip adequately positioned at the level of the mid SVC. No pneumothorax or other procedural complicating feature. Electronically Signed   By: Bary RichardStan  Maynard M.D.   On: 06/01/2015 15:35   Dg Hip Unilat With Pelvis 2-3 Views Right  06/01/2015  CLINICAL DATA:  Fall last night, generalized pain. EXAM: DG HIP (WITH OR WITHOUT PELVIS) 2-3V RIGHT COMPARISON:  None. FINDINGS: Single view of the pelvis and two views of the right hip are provided. There is diffuse osseous demineralization/osteopenia which limits characterization of osseous detail, however, there is no fracture line or displaced fracture fragment identified. Right femoral head appears well positioned relative to the acetabulum. Degenerative changes are seen at each hip joint, at least moderate in degree, left slightly greater than right, with associated joint space narrowing and osseous spurring. Additional degenerative change noted within the lower lumbar spine. Soft tissues about the pelvis and right hip are unremarkable. IMPRESSION: 1. No acute findings.  No osseous fracture or dislocation seen. 2. Diffuse osseous demineralization/osteopenia. 3. Degenerative changes at the bilateral hip joints, at least moderate in degree, left slightly greater than right. Electronically Signed   By: Bary RichardStan  Maynard M.D.   On: 06/01/2015 11:52    Labs:  CBC:  Recent Labs  06/02/15 0034  06/03/15 0255  06/03/15 1800 06/03/15 2000 06/04/15 0400 06/05/15 0430  WBC 13.6*  --  10.1  --   --   --  9.9 7.5  HGB 7.1*  < > 9.0*  < > 8.6* 8.4* 8.1*  8.3* 7.9*  HCT 21.6*  < > 27.3*  < > 25.6* 25.4* 24.3*  25.4* 24.6*  PLT 240  --  122*  --   --   --  124* 119*  < > = values in this interval not displayed.  COAGS:  Recent Labs  06/01/15 1125 06/03/15 1000  INR 1.07 1.27  APTT 27 33    BMP:  Recent Labs  06/02/15 1656 06/03/15 0255 06/04/15 0420 06/05/15 0430  NA 141 142 144 144  K 4.2 4.1  3.6 3.8  CL 111 114* 114* 114*  CO2 22 23 24 25   GLUCOSE 136* 118* 92 98  BUN 40* 34* 22* 16  CALCIUM 7.5* 7.5* 7.8* 7.7*  CREATININE 1.72* 1.56* 1.38* 1.36*  GFRNONAA 27* 30* 35* 35*  GFRAA 31* 35* 40* 41*    LIVER FUNCTION TESTS:  Recent Labs  06/01/15 1125 06/03/15 0255 06/04/15 0420 06/05/15 0430  BILITOT 0.5  --   --   --   AST 25  --   --   --   ALT 19  --   --   --   ALKPHOS 64  --   --   --   PROT 6.1*  --   --   --   ALBUMIN 2.4* 1.9* 2.0* 1.9*    TUMOR MARKERS: No results for input(s): AFPTM, CEA, CA199, CHROMGRNA in the last 8760 hours.  Assessment and Plan:  82 year old white female with history of upper GI bleed, hypotension and large duodenal bulb ulcer with  adherent clot, status post epinephrine injection to site on 4/3. Patient continues to have melanotic stools, anemia and request now received for mesenteric/visceral arteriogram with possible embolization. Blood pressure remains soft and patient due to receive additional transfusions. She remains on epi drip. Imaging and clinical history have been reviewed by Dr. Miles Costain. Plan is for visceral arteriogram with possible embolization as soon as possible this am. Details/risks of procedure, including but not limited to, internal bleeding, infection, inability to embolize vessel, worsening nephropathy, nontarget embolization. need for emergency surgery, discussed with patient with her understanding and consent.   Thank you for this interesting consult.  I greatly enjoyed meeting Cuma Polyakov Turpin and look forward to participating in their care.  A copy of this report was sent to the requesting provider on this date.  Electronically Signed: D. Jeananne Rama 06/05/2015, 9:05 AM   I spent a total of 40 minutes in face to face in clinical consultation, greater than 50% of which was counseling/coordinating care for mesenteric/visceral arteriogram with possible embolization.

## 2015-06-05 NOTE — Progress Notes (Signed)
[facility]   Reason for visit: Follow up on Enterococcal blood culture  Interval History: repeat cultures remain negative, afebrile.   Physical Exam: Constitutional:  Filed Vitals:   06/05/15 1500 06/05/15 1530  BP: 96/50 106/74  Pulse: 52 73  Temp: 97.2 F (36.2 C) 97.2 F (36.2 C)  Resp: 11 13   patient appears in NAD, sheet over head Respiratory: Normal respiratory effort; CTA B Cardiovascular: RRR  Review of Systems: Constitutional: negative for fevers and chills  Lab Results  Component Value Date   WBC 7.5 06/05/2015   HGB 7.5* 06/05/2015   HCT 22.1* 06/05/2015   MCV 88.2 06/05/2015   PLT 119* 06/05/2015    Lab Results  Component Value Date   CREATININE 1.36* 06/05/2015   BUN 16 06/05/2015   NA 144 06/05/2015   K 3.8 06/05/2015   CL 114* 06/05/2015   CO2 25 06/05/2015    Lab Results  Component Value Date   ALT 19 06/01/2015   AST 25 06/01/2015   ALKPHOS 64 06/01/2015     Microbiology: Recent Results (from the past 240 hour(s))  Urine culture     Status: None (Preliminary result)   Collection Time: 06/01/15 11:37 AM  Result Value Ref Range Status   Specimen Description URINE, CATHETERIZED  Final   Special Requests NONE  Final   Culture   Final    >=100,000 COLONIES/mL ENTEROCOCCUS SPECIES SENT TO REFERENCE LAB FOR SENSITIVITIES Performed at [facility]    Report Status PENDING  Incomplete  Culture, blood (single)     Status: None   Collection Time: 06/01/15  3:13 PM  Result Value Ref Range Status   Specimen Description BLOOD RIGHT ARM  Final   Special Requests BOTTLES DRAWN AEROBIC AND ANAEROBIC 7CC  Final   Culture  Setup Time   Final    IN BOTH AEROBIC AND ANAEROBIC BOTTLES GRAM POSITIVE COCCI IN PAIRS IN CHAINS CRITICAL RESULT CALLED TO, READ BACK BY AND VERIFIED WITH: M.SCHRAMM,RN [id number] 06/02/15 M.CAMPBELL    Culture   Final    ENTEROCOCCUS SPECIES Performed at [facility]    Report  Status 06/04/2015 FINAL  Final   Organism ID, Bacteria ENTEROCOCCUS SPECIES  Final      Susceptibility   Enterococcus species - MIC*    AMPICILLIN <=2 SENSITIVE Sensitive     VANCOMYCIN 1 SENSITIVE Sensitive     GENTAMICIN SYNERGY SENSITIVE Sensitive     * ENTEROCOCCUS SPECIES  Urine culture     Status: None   Collection Time: 06/01/15  4:30 PM  Result Value Ref Range Status   Specimen Description URINE, CATHETERIZED  Final   Special Requests NONE  Final   Culture   Final    >=100,000 COLONIES/mL ENTEROCOCCUS SPECIES Performed at [facility]    Report Status 06/04/2015 FINAL  Final   Organism ID, Bacteria ENTEROCOCCUS SPECIES  Final      Susceptibility   Enterococcus species - MIC*    AMPICILLIN <=2 SENSITIVE Sensitive     LEVOFLOXACIN 1 SENSITIVE Sensitive     NITROFURANTOIN 32 SENSITIVE Sensitive     VANCOMYCIN 1 SENSITIVE Sensitive     * >=100,000 COLONIES/mL ENTEROCOCCUS SPECIES  MRSA PCR Screening     Status: None   Collection Time: 06/01/15  4:37 PM  Result Value Ref Range Status   MRSA by PCR NEGATIVE NEGATIVE Final    Comment:        The GeneXpert  MRSA Assay (FDA approved for NASAL specimens only), is one component of a comprehensive MRSA colonization surveillance program. It is not intended to diagnose MRSA infection nor to guide or monitor treatment for MRSA infections.   Culture, blood (routine x 2)     Status: None (Preliminary result)   Collection Time: 06/01/15  8:51 PM  Result Value Ref Range Status   Specimen Description BLOOD BLOOD LEFT HAND  Final   Special Requests BOTTLES DRAWN AEROBIC ONLY 4CC  Final   Culture   Final    NO GROWTH 3 DAYS Performed at [facility]    Report Status PENDING  Incomplete  Culture, blood (routine x 2)     Status: None (Preliminary result)   Collection Time: 06/04/15 11:10 AM  Result Value Ref Range Status   Specimen Description BLOOD RIGHT HAND  Final   Special Requests IN PEDIATRIC BOTTLE 4CC   Final   Culture   Final    NO GROWTH < 24 HOURS Performed at [facility]    Report Status PENDING  Incomplete  Culture, blood (routine x 2)     Status: None (Preliminary result)   Collection Time: 06/04/15 11:11 AM  Result Value Ref Range Status   Specimen Description BLOOD LEFT HAND  Final   Special Requests IN PEDIATRIC BOTTLE 3CC  Final   Culture   Final    NO GROWTH < 24 HOURS Performed at [facility]    Report Status PENDING  Incomplete    Impression/Plan:  1. Continue ampicillin

## 2015-06-05 NOTE — Progress Notes (Signed)
PULMONARY / CRITICAL CARE MEDICINE   Name: Jasmine Newman MRN: [id number] DOB: 11-13-1931    ADMISSION DATE:  06/01/2015 CONSULTATION DATE:  06/01/15  REFERRING MD:  ED  CHIEF COMPLAINT:  GI bleed, hypotension  HISTORY OF PRESENT ILLNESS:   82 year old with past mental history of hypothyroidism, hypertension, hyperlipidemia, depression. Admitted today to the ED after she had a fall at home. She [facility] with her husband. When she stood up to use the bathroom her legs gave out and she fell on the right side. She hurt her leg and chest. She did not hit her head and there was no loss of consciousness. She on the floor the whole night. When her son arrived next morning she was found to be lying in a large pool of melanotic stool. In the ED she was found to be hypertensive with hemoglobin of 7.4 (baseline unknown). She continued to be hypotensive in spite of 2 L of fluid. GI evaluated the patient. PCCM called for admission.  SUBJECTIVE: Patient began to have more and frequent bloody bowel movements this morning. Mild nausea and only a minimal amount of emesis blood-tinged secretions. Minimal abdominal pain. No chest pain or pressure.  REVIEW OF SYSTEMS:  No subjective fever, chills, or sweats. No dyspnea or cough.  VITAL SIGNS: BP 100/74 mmHg  Pulse 65  Temp(Src) 98.2 F (36.8 C) (Core (Comment))  Resp 14  Ht 5\' 5"  (1.651 m)  Wt 217 lb 13 oz (98.8 kg)  BMI 36.25 kg/m2  SpO2 98%  LMP  (LMP Unknown)  HEMODYNAMICS:    VENTILATOR SETTINGS:    INTAKE / OUTPUT: I/O last 3 completed shifts: In: 4001.7 [I.V.:3401.7; IV Piggyback:600] Out: 3100 [Urine:3100]  PHYSICAL EXAMINATION: General:  Elderly female. Awake. Alert.  Neuro: Following commands. Grossly nonfocal. No meningismus. HEENT: Moist. His membranes. No scleral injection.  Cardiovascular:  Sinus rhythm with regular rate. No appreciable JVD.  Lungs:  Clear bilaterally to auscultation.  Normal work of breathing on nasal cannula oxygen. Abdomen: Soft. Normal bowel sounds. Nontender. Integument:  Warm & dry. No rash on exposed skin.  LABS:  BMET  Recent Labs Lab 06/03/15 0255 06/04/15 0420 06/05/15 0430  NA 142 144 144  K 4.1 3.6 3.8  CL 114* 114* 114*  CO2 23 24 25   BUN 34* 22* 16  CREATININE 1.56* 1.38* 1.36*  GLUCOSE 118* 92 98    Electrolytes  Recent Labs Lab 06/03/15 0255 06/04/15 0400 06/04/15 0420 06/05/15 0430  CALCIUM 7.5*  --  7.8* 7.7*  MG 1.9 1.8  --  2.1  PHOS 3.0  --  3.0 2.6    CBC  Recent Labs Lab 06/03/15 0255  06/03/15 2000 06/04/15 0400 06/05/15 0430  WBC 10.1  --   --  9.9 7.5  HGB 9.0*  < > 8.4* 8.1*  8.3* 7.9*  HCT 27.3*  < > 25.4* 24.3*  25.4* 24.6*  PLT 122*  --   --  124* 119*  < > = values in this interval not displayed.  Coag's  Recent Labs Lab 06/01/15 1125 06/03/15 1000  APTT 27 33  INR 1.07 1.27    Sepsis Markers  Recent Labs Lab 06/01/15 1529 06/01/15 2054 06/01/15 2100 06/02/15 0034  LATICACIDVEN 0.87 0.7  --  1.0  PROCALCITON  --   --  0.63  --     ABG  Recent Labs Lab 06/02/15 1710 06/03/15 0401  PHART 7.348* 7.332*  PCO2ART 38.2 40.0  PO2ART 396* 82.4    Liver Enzymes  Recent Labs Lab 06/01/15 1125 06/03/15 0255 06/04/15 0420 06/05/15 0430  AST 25  --   --   --   ALT 19  --   --   --   ALKPHOS 64  --   --   --   BILITOT 0.5  --   --   --   ALBUMIN 2.4* 1.9* 2.0* 1.9*    Cardiac Enzymes  Recent Labs Lab 06/02/15 1656 06/03/15 1800 06/04/15 0400  TROPONINI 3.27* 1.26* 1.27*    Glucose  Recent Labs Lab 06/03/15 2002 06/03/15 2345 06/04/15 0355 06/04/15 0728 06/04/15 1145 06/05/15 0813  GLUCAP 85 79 78 80 85 125*    Imaging No results found.  STUDIES:  X ray rib 4/2: No fractures Port CXR 4/2:  Questionable right basal hazy opacity. R IJ CVL in mid-SVC. Chronic elevation left hemidiaphragm.  R Knee 2View 4/2: No fracture or dislocation. Port  CXR 4/4:  Silhouetting of left hemidiaphragm unchanged. Endotracheal tube and central venous catheter in good position. No new focal opacity. TTE 4/4: LV normal in size with EF 45-50%. Akinesis of anterior septal myocardium. Grade 2 diastolic dysfunction. LA & RA normal in size. RV normal in size and function. PASP . Moderate aortic stenosis without regurgitation. Severe mitral vegetation without stenosis. Trivial pulmonic regurgitation without stenosis. Moderate tricuspid regurgitation.  CULTURES: Blood Ctx x2 4/2>>> Enterococcus 1/2 bottles Urine Ctx x2 4/2>>> Enterococcus 2/2 cultures MRSA PCR 4/2:  Negative   ANTIBIOTICS: Ampicillin 4/5>>> Vancomycin 4/3 - 4/5 Rocephin 4/3 - 4/5 Unasyn 4/2  SIGNIFICANT EVENTS: 4/2 - Admit 4/3 - Intubation & EGD 4/4 - Extubation  LINES/TUBES: R IJ CVL 4/2>> Foley 4/2>> PIV x1 OETT 4/3 - 4/4  ASSESSMENT / PLAN:  PULMONARY A: Possible RLL Pneumonia Acute Hypoxic Respiratory Failure - Likely secondary to anemia & IV blood/fluid resuscitation. Intubated for EGD 4/3 then extubated 4/4.  P:   Continuous pulse ox Wean for FiO2 >94%  CARDIOVASCULAR A:  Shock - Intermittent. Sepsis vs GIB. Moderate Aortic Stenosis / Severe Mitral Regurg - Seen on TTE 4/4. NSTEMI - Possible MI given wall motion abnormality on TTE 4/4. Atrial Fibrillation - Intermittent. H/O HTN H/O Hyperlipdemia H/O Bioprosthetic Valve - Porcine? Position?  P:  Weaning Neosynephrine to maintain MAP>65 No systemic anticoagulation given GIB Monitor on telemetry Vitals per unit protocol Holding Lisinopril & Lopressor Zocor qhs Plan for Cardiology Consult once patient stabilizes  RENAL A:   Acute Renal Failure - Improving. Hypomagnesemia - Replacing.  Hypokalemia - Replacing.  P:    Trending UOP with Foley Monitoring daily renal function & electrolytes Replacing electrolytes as indicated  GASTROINTESTINAL A:   Acute GIB - Secondary to Duodenal Ulcer on  EGD 4/3.  P:   IR to embolize GI Following Restarting Protonix gtt NPO  HEMATOLOGIC A:   Anemia - Secondary to GIB. S/P 7u PRBC.  Thrombocytopenia - Mild. Likely consumption.   P:  Transfuse 2u PRBC now Trending Hgb/Hct q4hr Transfuse for Hgb <8.0 or active bleeding Trending remaining cell counts daily w/ CBC SCDs  INFECTIOUS A:   Sepsis - Secondary to UTI & Bacteremia Enterococcus Bacteremia Enterococcus UTI  P:   ID Following & appreciate recommendations Repeat Cultures pending Ampicillin Day #5  Procalcitonin per algorithm  ENDOCRINE A:   Questionable Adrenal Insufficiency H/O Hypothyroidism  P:   Synthroid IV daily Accu-Checks q4hr SSI per algorithm Holding on steroid therapy at this time given duodenal ulcer  NEUROLOGIC A:   Pain in Right Knee - No fracture of X-ray. H/O Depression  P:   Goal RASS:  0 Holding Wellbutrin XL & Cymbalta Holding pain medication   FAMILY  - Updates: Husband updated by phone 4/6 by Dr. Jamison Neighbor - Inter-disciplinary family meet or Palliative Care meeting due by:  4/9  TODAY'S SUMMARY:  A 82 year old presenting with fall, ongoing hypotension, & GI Bleed. Found to have duodenal ulcer on EGD. Plan for embolization in interventional radiology today. Transfusing 2 units packed red blood cells. Switching back to Protonix drip. Patient will be nothing by mouth until procedure is complete.  I have spent a total of 39 minutes of critical care time today caring for the patient, discussing plan of care with IR, and reviewing the patient's electronic medical record.   Donna Christen Jamison Neighbor, M.D. [facility] Pulmonary & Critical Care Pager:  [phone] After 3pm or if no response, call [phone]  06/05/2015, 8:58 AM

## 2015-06-05 NOTE — Progress Notes (Signed)
Patient's hemoglobin post 2 units of packed red blood cells this morning 7.5. Transfusing 1 unit packed red blood cells with goal hemoglobin 8.0 or greater with recent MI. Spoke with nurse at bedside and made her aware of this pending order.  Donna ChristenJennings E. Jamison NeighborNestor, M.D. [facility] Pager:  [phone] After 3pm or if no response, call [phone] 7:07 PM 06/05/2015

## 2015-06-05 NOTE — Progress Notes (Signed)
[facility] Progress Note  Subjective: The patient is doing well this afternoon post arterial embolization for her bleeding duodenal ulcer.  Objective: Vital signs in last 24 hours: Temp:  [96.6 F (35.9 C)-98.8 F (37.1 C)] 97.3 F (36.3 C) (04/06 1600) Pulse Rate:  [50-80] 59 (04/06 1600) Resp:  [10-26] 12 (04/06 1600) BP: (61-154)/(29-98) 113/78 mmHg (04/06 1600) SpO2:  [85 %-100 %] 100 % (04/06 1600) Weight:  [98.8 kg (217 lb 13 oz)] 98.8 kg (217 lb 13 oz) (04/06 0438) Weight change: 0.9 kg (1 lb 15.7 oz)   PE:  Alert and oriented  No distress  Lab Results: Results for orders placed or performed during the hospital encounter of 06/01/15 (from the past 24 hour(s))  CBC with Differential/Platelet     Status: Abnormal   Collection Time: 06/05/15  4:30 AM  Result Value Ref Range   WBC 7.5 4.0 - 10.5 K/uL   RBC 2.79 (L) 3.87 - 5.11 MIL/uL   Hemoglobin 7.9 (L) 12.0 - 15.0 g/dL   HCT 16.1 (L) 09.6 - 04.5 %   MCV 88.2 78.0 - 100.0 fL   MCH 28.3 26.0 - 34.0 pg   MCHC 32.1 30.0 - 36.0 g/dL   RDW 40.9 (H) 81.1 - 91.4 %   Platelets 119 (L) 150 - 400 K/uL   Neutrophils Relative % 65 %   Lymphocytes Relative 20 %   Monocytes Relative 12 %   Eosinophils Relative 3 %   Basophils Relative 0 %   Neutro Abs 4.9 1.7 - 7.7 K/uL   Lymphs Abs 1.5 0.7 - 4.0 K/uL   Monocytes Absolute 0.9 0.1 - 1.0 K/uL   Eosinophils Absolute 0.2 0.0 - 0.7 K/uL   Basophils Absolute 0.0 0.0 - 0.1 K/uL   RBC Morphology POLYCHROMASIA PRESENT   Renal function panel     Status: Abnormal   Collection Time: 06/05/15  4:30 AM  Result Value Ref Range   Sodium 144 135 - 145 mmol/L   Potassium 3.8 3.5 - 5.1 mmol/L   Chloride 114 (H) 101 - 111 mmol/L   CO2 25 22 - 32 mmol/L   Glucose, Bld 98 65 - 99 mg/dL   BUN 16 6 - 20 mg/dL   Creatinine, Ser 7.82 (H) 0.44 - 1.00 mg/dL   Calcium 7.7 (L) 8.9 - 10.3 mg/dL   Phosphorus 2.6 2.5 - 4.6 mg/dL   Albumin 1.9 (L) 3.5 - 5.0 g/dL   GFR calc non Af Amer  35 (L) >60 mL/min   GFR calc Af Amer 41 (L) >60 mL/min   Anion gap 5 5 - 15  Magnesium     Status: None   Collection Time: 06/05/15  4:30 AM  Result Value Ref Range   Magnesium 2.1 1.7 - 2.4 mg/dL  Prepare RBC     Status: None   Collection Time: 06/05/15  8:00 AM  Result Value Ref Range   Order Confirmation ORDER PROCESSED BY BLOOD BANK   Type and screen [facility]     Status: None (Preliminary result)   Collection Time: 06/05/15  8:00 AM  Result Value Ref Range   ABO/RH(D) O POS    Antibody Screen NEG    Sample Expiration 06/08/2015    Unit Number [id number]    Blood Component Type RED CELLS,LR    Unit division 00    Status of Unit ISSUED    Transfusion Status OK TO TRANSFUSE    Crossmatch Result Compatible    Unit  Number [id number]    Blood Component Type RBC LR PHER1    Unit division 00    Status of Unit ISSUED    Transfusion Status OK TO TRANSFUSE    Crossmatch Result Compatible    Unit Number [id number]    Blood Component Type RBC LR PHER1    Unit division 00    Status of Unit ALLOCATED    Transfusion Status OK TO TRANSFUSE    Crossmatch Result Compatible    Unit Number [id number]    Blood Component Type RED CELLS,LR    Unit division 00    Status of Unit ALLOCATED    Transfusion Status OK TO TRANSFUSE    Crossmatch Result Compatible   Glucose, capillary     Status: Abnormal   Collection Time: 06/05/15  8:13 AM  Result Value Ref Range   Glucose-Capillary 125 (H) 65 - 99 mg/dL   Comment 1 Notify RN    Comment 2 Document in Chart   Hemoglobin and hematocrit, blood     Status: Abnormal   Collection Time: 06/05/15  1:41 PM  Result Value Ref Range   Hemoglobin 7.5 (L) 12.0 - 15.0 g/dL   HCT 24.422.1 (L) 01.036.0 - 27.246.0 %    Studies/Results: Ir Angiogram Visceral Selective  06/05/2015  INDICATION: 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 ANESTHESIA/SEDATION: 50 MCG FENTANYL ONLY Moderate Sedation Time: NONE. The patient's level of consciousness and vital signs were monitored continuously by radiology nursing throughout the procedure under my direct supervision. CONTRAST:  50mL ISOVUE-300 IOPAMIDOL (ISOVUE-300) INJECTION 61% FLUOROSCOPY TIME:  Fluoroscopy Time: 12 minutes 18 seconds (947 mGy). COMPLICATIONS: None immediate. PROCEDURE: Informed consent was obtained from the patient following explanation of the procedure, risks, benefits and alternatives. The patient understands, agrees and consents for the procedure. All questions were addressed. A time out was performed prior to the initiation of the procedure. Maximal barrier sterile technique utilized including caps, mask, sterile gowns, sterile gloves, large sterile drape, hand hygiene, and Betadine prep. Under sterile conditions and local anesthesia, ultrasound micropuncture access performed of the patent right common femoral artery. Five French sheath inserted over a guidewire. Five French C2 catheter advanced over a Bentson guidewire to the celiac artery. Selective celiac angiogram performed. Celiac: Celiac is widely patent. Left gastric, splenic and hepatic vasculature patent. GDA is patent. There is active extravasation from the gastroduodenal artery localized to the level of the duodenum compatible with a bleeding duodenal ulcer. Renegade STC micro catheter and GT micro Glidewire were advanced through the C2 catheter into the gastroduodenal artery. Selective gastroduodenal angiogram performed. Gastroduodenal artery: Gastroduodenal artery is patent. Active bleeding present from a small distal branch of the gastroduodenal artery compatible with an active bleeding duodenal ulcer. Embolization: Micro catheter was advanced over the catheter and guidewire to the level of the  gastroduodenal artery bleeding site. Repeat injection confirms micro catheter position at the site of active bleeding. Micro coil embolization performed of the gastroduodenal artery with insertion of 3 3 mm x 6 mm interlock coils, 1 4 mm x 8 mm soft coil, 1 4 mm x 12 mm soft coil, and 1 5 mm x 8 mm soft coil. Post embolization angiogram demonstrates occlusion of the gastroduodenal artery with no further active bleeding. Micro catheter was retracted into the common hepatic artery. Common hepatic artery  angiogram performed. Common hepatic: This confirms preserved patency of the common, proper, left and right hepatic arteries. Access removed. Hemostasis obtained with manual compression. No immediate complication. Patient tolerated the procedure well. 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 Successful micro coil embolization of the gastroduodenal artery to complete occlusion with no further active bleeding. Electronically Signed   By: Judie Petit.  Shick M.D.   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 ANESTHESIA/SEDATION: 50 MCG FENTANYL ONLY Moderate Sedation Time: NONE. The patient's level of consciousness and vital signs were monitored continuously by radiology nursing throughout the procedure under my direct supervision. CONTRAST:  50mL ISOVUE-300 IOPAMIDOL (ISOVUE-300) INJECTION 61% FLUOROSCOPY TIME:  Fluoroscopy Time: 12 minutes 18 seconds (947 mGy). COMPLICATIONS: None immediate. PROCEDURE: Informed consent was obtained from the patient following explanation of the procedure, risks, benefits and  alternatives. The patient understands, agrees and consents for the procedure. All questions were addressed. A time out was performed prior to the initiation of the procedure. Maximal barrier sterile technique utilized including caps, mask, sterile gowns, sterile gloves, large sterile drape, hand hygiene, and Betadine prep. Under sterile conditions and local anesthesia, ultrasound micropuncture access performed of the patent right common femoral artery. Five French sheath inserted over a guidewire. Five French C2 catheter advanced over a Bentson guidewire to the celiac artery. Selective celiac angiogram performed. Celiac: Celiac is widely patent. Left gastric, splenic and hepatic vasculature patent. GDA is patent. There is active extravasation from the gastroduodenal artery localized to the level of the duodenum compatible with a bleeding duodenal ulcer. Renegade STC micro catheter and GT micro Glidewire were advanced through the C2 catheter into the gastroduodenal artery. Selective gastroduodenal angiogram performed. Gastroduodenal artery: Gastroduodenal artery is patent. Active bleeding present from a small distal branch of the gastroduodenal artery compatible with an active bleeding duodenal ulcer. Embolization: Micro catheter was advanced over the catheter and guidewire to the level of the gastroduodenal artery bleeding site. Repeat injection confirms micro catheter position at the site of active bleeding. Micro coil embolization performed of the gastroduodenal artery with insertion of 3 3 mm x 6 mm interlock coils, 1 4 mm x 8 mm soft coil, 1 4 mm x 12 mm soft coil, and 1 5 mm x 8 mm soft coil. Post embolization angiogram demonstrates occlusion of the gastroduodenal artery with no further active bleeding. Micro catheter was retracted into the common hepatic artery. Common hepatic artery angiogram performed. Common hepatic: This confirms preserved patency of the common, proper, left and right hepatic arteries.  Access removed. Hemostasis obtained with manual compression. No immediate complication. Patient tolerated the procedure well. 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 Successful micro coil embolization of the gastroduodenal artery to complete occlusion with no further active bleeding. Electronically Signed   By: Judie Petit.  Shick M.D.   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 ANESTHESIA/SEDATION: 50 MCG FENTANYL ONLY Moderate Sedation Time: NONE. The patient's level of consciousness and vital signs were monitored continuously by radiology nursing throughout the procedure under my direct supervision. CONTRAST:  50mL ISOVUE-300 IOPAMIDOL (ISOVUE-300) INJECTION 61% FLUOROSCOPY TIME:  Fluoroscopy Time: 12 minutes 18 seconds (947 mGy). COMPLICATIONS: None immediate. PROCEDURE: Informed consent was obtained from the patient following explanation of the procedure, risks, benefits and alternatives. The patient understands, agrees and consents for the procedure. All questions were addressed. A time out was performed prior to the initiation of the procedure. Maximal barrier sterile technique utilized including caps, mask, sterile gowns, sterile gloves, large sterile drape, hand hygiene, and Betadine prep. Under sterile conditions and local anesthesia, ultrasound micropuncture access performed of the patent right common femoral artery. Five French sheath inserted over a guidewire. Five French C2 catheter advanced over a Bentson guidewire to the celiac artery. Selective celiac angiogram performed. Celiac: Celiac is  widely patent. Left gastric, splenic and hepatic vasculature patent. GDA is patent. There is active extravasation from the gastroduodenal artery localized to the level of the duodenum compatible with a bleeding duodenal ulcer. Renegade STC micro catheter and GT micro Glidewire were advanced through the C2 catheter into the gastroduodenal artery. Selective gastroduodenal angiogram performed. Gastroduodenal artery: Gastroduodenal artery is patent. Active bleeding present from a small distal branch of the gastroduodenal artery compatible with an active bleeding duodenal ulcer. Embolization: Micro catheter was advanced over the catheter and guidewire to the level of the gastroduodenal artery bleeding site. Repeat injection confirms micro catheter position at the site of active bleeding. Micro coil embolization performed of the gastroduodenal artery with insertion of 3 3 mm x 6 mm interlock coils, 1 4 mm x 8 mm soft coil, 1 4 mm x 12 mm soft coil, and 1 5 mm x 8 mm soft coil. Post embolization angiogram demonstrates occlusion of the gastroduodenal artery with no further active bleeding. Micro catheter was retracted into the common hepatic artery. Common hepatic artery angiogram performed. Common hepatic: This confirms preserved patency of the common, proper, left and right hepatic arteries. Access removed. Hemostasis obtained with manual compression. No immediate complication. Patient tolerated the procedure well. 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 Successful micro coil embolization of the gastroduodenal artery to complete occlusion with no further active bleeding. Electronically Signed   By: Judie Petit.  Shick M.D.   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 ANESTHESIA/SEDATION: 50 MCG FENTANYL ONLY Moderate Sedation Time: NONE. The patient's level of consciousness and vital signs were monitored continuously by radiology nursing throughout the procedure under my direct supervision. CONTRAST:  50mL ISOVUE-300 IOPAMIDOL (ISOVUE-300) INJECTION 61% FLUOROSCOPY TIME:  Fluoroscopy Time: 12 minutes 18 seconds (947 mGy). COMPLICATIONS: None immediate. PROCEDURE: Informed consent was obtained from the patient following explanation of the procedure, risks, benefits and alternatives. The patient understands, agrees and consents for the procedure. All questions were addressed. A time out was performed prior to the initiation of the procedure. Maximal barrier sterile technique utilized including caps, mask, sterile gowns, sterile gloves, large sterile drape, hand hygiene, and Betadine prep. Under sterile conditions and local anesthesia, ultrasound micropuncture  access performed of the patent right common femoral artery. Five French sheath inserted over a guidewire. Five French C2 catheter advanced over a Bentson guidewire to the celiac artery. Selective celiac angiogram performed. Celiac: Celiac is widely patent. Left gastric, splenic and hepatic vasculature patent. GDA is patent. There is active extravasation from the gastroduodenal artery localized to the level of the duodenum compatible with a bleeding duodenal ulcer. Renegade STC micro catheter and GT micro Glidewire were advanced through the C2 catheter into the gastroduodenal artery. Selective gastroduodenal angiogram performed. Gastroduodenal artery: Gastroduodenal artery is patent. Active bleeding present from a small distal branch of the gastroduodenal artery compatible with an active bleeding duodenal ulcer. Embolization: Micro catheter was advanced over the catheter and guidewire to  the level of the gastroduodenal artery bleeding site. Repeat injection confirms micro catheter position at the site of active bleeding. Micro coil embolization performed of the gastroduodenal artery with insertion of 3 3 mm x 6 mm interlock coils, 1 4 mm x 8 mm soft coil, 1 4 mm x 12 mm soft coil, and 1 5 mm x 8 mm soft coil. Post embolization angiogram demonstrates occlusion of the gastroduodenal artery with no further active bleeding. Micro catheter was retracted into the common hepatic artery. Common hepatic artery angiogram performed. Common hepatic: This confirms preserved patency of the common, proper, left and right hepatic arteries. Access removed. Hemostasis obtained with manual compression. No immediate complication. Patient tolerated the procedure well. 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 Successful micro coil embolization of the gastroduodenal artery to complete occlusion with no further active bleeding. Electronically Signed   By: Judie Petit.  Shick M.D.   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 ANESTHESIA/SEDATION: 50 MCG FENTANYL ONLY Moderate Sedation Time: NONE. The patient's level of consciousness and vital signs were monitored continuously by radiology nursing throughout the procedure under my direct supervision. CONTRAST:  50mL ISOVUE-300 IOPAMIDOL (ISOVUE-300) INJECTION 61% FLUOROSCOPY TIME:  Fluoroscopy Time: 12 minutes 18 seconds (947 mGy). COMPLICATIONS: None immediate. PROCEDURE: Informed consent was obtained from the patient following explanation of the  procedure, risks, benefits and alternatives. The patient understands, agrees and consents for the procedure. All questions were addressed. A time out was performed prior to the initiation of the procedure. Maximal barrier sterile technique utilized including caps, mask, sterile gowns, sterile gloves, large sterile drape, hand hygiene, and Betadine prep. Under sterile conditions and local anesthesia, ultrasound micropuncture access performed of the patent right common femoral artery. Five French sheath inserted over a guidewire. Five French C2 catheter advanced over a Bentson guidewire to the celiac artery. Selective celiac angiogram performed. Celiac: Celiac is widely patent. Left gastric, splenic and hepatic vasculature patent. GDA is patent. There is active extravasation from the gastroduodenal artery localized to the level of the duodenum compatible with a bleeding duodenal ulcer. Renegade STC micro catheter and GT micro Glidewire were advanced through the C2 catheter into the gastroduodenal artery. Selective gastroduodenal angiogram performed. Gastroduodenal artery: Gastroduodenal artery is patent. Active bleeding present from a small distal branch of the gastroduodenal artery compatible with an active bleeding duodenal ulcer. Embolization: Micro catheter was advanced  over the catheter and guidewire to the level of the gastroduodenal artery bleeding site. Repeat injection confirms micro catheter position at the site of active bleeding. Micro coil embolization performed of the gastroduodenal artery with insertion of 3 3 mm x 6 mm interlock coils, 1 4 mm x 8 mm soft coil, 1 4 mm x 12 mm soft coil, and 1 5 mm x 8 mm soft coil. Post embolization angiogram demonstrates occlusion of the gastroduodenal artery with no further active bleeding. Micro catheter was retracted into the common hepatic artery. Common hepatic artery angiogram performed. Common hepatic: This confirms preserved patency of the common, proper, left  and right hepatic arteries. Access removed. Hemostasis obtained with manual compression. No immediate complication. Patient tolerated the procedure well. 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 Successful micro coil embolization of the gastroduodenal artery to complete occlusion with no further active bleeding. Electronically Signed   By: Judie Petit.  Shick M.D.   On: 06/05/2015 13:27      Assessment: Huge duodenal ulcer with bleeding. Status post embolization by interventional radiology  Plan:   Continue observation and medical management    Gwenevere Abbot 06/05/2015, 4:37 PM  Pager: [phone] If no answer or after 5 PM call [phone] Lab Results  Component Value Date   HGB 7.5* 06/05/2015   HGB 7.9* 06/05/2015   HGB 8.1* 06/04/2015   HGB 8.3* 06/04/2015   HCT 22.1* 06/05/2015   HCT 24.6* 06/05/2015   HCT 24.3* 06/04/2015   HCT 25.4* 06/04/2015   ALKPHOS 64 06/01/2015   ALKPHOS 83 06/28/2011   AST 25 06/01/2015   AST 15 06/28/2011   ALT 19 06/01/2015   ALT 10 06/28/2011   AMYLASE 44 06/01/2015

## 2015-06-06 ENCOUNTER — Encounter (HOSPITAL_COMMUNITY): Payer: Self-pay | Admitting: Pulmonary Disease

## 2015-06-06 ENCOUNTER — Inpatient Hospital Stay (HOSPITAL_COMMUNITY): Payer: Medicare Other

## 2015-06-06 ENCOUNTER — Inpatient Hospital Stay (HOSPITAL_COMMUNITY): Payer: Medicare Other | Admitting: Anesthesiology

## 2015-06-06 ENCOUNTER — Encounter (HOSPITAL_COMMUNITY): Payer: Self-pay | Admitting: Anesthesiology

## 2015-06-06 ENCOUNTER — Encounter (HOSPITAL_COMMUNITY)
Admission: EM | Disposition: A | Discharge status: Skilled Nursing Facility | Payer: Self-pay | Source: Home / Self Care | Attending: Pulmonary Disease

## 2015-06-06 DIAGNOSIS — K264 Chronic or unspecified duodenal ulcer with hemorrhage: Secondary | ICD-10-CM

## 2015-06-06 DIAGNOSIS — R7881 Bacteremia: Secondary | ICD-10-CM

## 2015-06-06 DIAGNOSIS — R571 Hypovolemic shock: Secondary | ICD-10-CM | POA: Insufficient documentation

## 2015-06-06 DIAGNOSIS — I214 Non-ST elevation (NSTEMI) myocardial infarction: Secondary | ICD-10-CM | POA: Insufficient documentation

## 2015-06-06 HISTORY — PX: LAPAROTOMY: SHX154

## 2015-06-06 LAB — CBC WITH DIFFERENTIAL/PLATELET
BASOS PCT: 0 %
Basophils Absolute: 0 10*3/uL (ref 0.0–0.1)
Basophils Absolute: 0 10*3/uL (ref 0.0–0.1)
Basophils Relative: 0 %
EOS ABS: 0.2 10*3/uL (ref 0.0–0.7)
Eosinophils Absolute: 0.2 10*3/uL (ref 0.0–0.7)
Eosinophils Relative: 2 %
Eosinophils Relative: 2 %
HEMATOCRIT: 23.4 % — AB (ref 36.0–46.0)
HEMATOCRIT: 21.8 % — AB (ref 36.0–46.0)
HEMOGLOBIN: 7.9 g/dL — AB (ref 12.0–15.0)
Hemoglobin: 7.5 g/dL — ABNORMAL LOW (ref 12.0–15.0)
LYMPHS ABS: 2.1 10*3/uL (ref 0.7–4.0)
LYMPHS PCT: 15 %
Lymphocytes Relative: 20 %
Lymphs Abs: 1.3 10*3/uL (ref 0.7–4.0)
MCH: 29.3 pg (ref 26.0–34.0)
MCH: 30.2 pg (ref 26.0–34.0)
MCHC: 33.8 g/dL (ref 30.0–36.0)
MCHC: 34.4 g/dL (ref 30.0–36.0)
MCV: 86.7 fL (ref 78.0–100.0)
MCV: 87.9 fL (ref 78.0–100.0)
MONO ABS: 1.2 10*3/uL — AB (ref 0.1–1.0)
MONO ABS: 0.6 10*3/uL (ref 0.1–1.0)
MONOS PCT: 11 %
MONOS PCT: 7 %
NEUTROS ABS: 7.2 10*3/uL (ref 1.7–7.7)
NEUTROS ABS: 6.6 10*3/uL (ref 1.7–7.7)
NEUTROS PCT: 67 %
Neutrophils Relative %: 75 %
Platelets: 106 10*3/uL — ABNORMAL LOW (ref 150–400)
Platelets: 98 10*3/uL — ABNORMAL LOW (ref 150–400)
RBC: 2.7 MIL/uL — ABNORMAL LOW (ref 3.87–5.11)
RBC: 2.48 MIL/uL — ABNORMAL LOW (ref 3.87–5.11)
RDW: 17.7 % — AB (ref 11.5–15.5)
RDW: 15.8 % — AB (ref 11.5–15.5)
WBC: 10.8 10*3/uL — ABNORMAL HIGH (ref 4.0–10.5)
WBC: 8.7 10*3/uL (ref 4.0–10.5)

## 2015-06-06 LAB — COMPREHENSIVE METABOLIC PANEL
ALBUMIN: 1.7 g/dL — AB (ref 3.5–5.0)
ALK PHOS: 35 U/L — AB (ref 38–126)
ALT: 16 U/L (ref 14–54)
AST: 18 U/L (ref 15–41)
Anion gap: 6 (ref 5–15)
BILIRUBIN TOTAL: 0.5 mg/dL (ref 0.3–1.2)
BUN: 13 mg/dL (ref 6–20)
CALCIUM: 7 mg/dL — AB (ref 8.9–10.3)
CO2: 21 mmol/L — AB (ref 22–32)
CREATININE: 1.28 mg/dL — AB (ref 0.44–1.00)
Chloride: 119 mmol/L — ABNORMAL HIGH (ref 101–111)
GFR calc non Af Amer: 38 mL/min — ABNORMAL LOW (ref >60)
GFR, EST AFRICAN AMERICAN: 44 mL/min — AB (ref >60)
GLUCOSE: 132 mg/dL — AB (ref 65–99)
Potassium: 4 mmol/L (ref 3.5–5.1)
SODIUM: 146 mmol/L — AB (ref 135–145)
TOTAL PROTEIN: 3.4 g/dL — AB (ref 6.5–8.1)

## 2015-06-06 LAB — GLUCOSE, CAPILLARY
GLUCOSE-CAPILLARY: 86 mg/dL (ref 65–99)
GLUCOSE-CAPILLARY: 90 mg/dL (ref 65–99)
GLUCOSE-CAPILLARY: 123 mg/dL — AB (ref 65–99)
GLUCOSE-CAPILLARY: 103 mg/dL — AB (ref 65–99)
Glucose-Capillary: 89 mg/dL (ref 65–99)
Glucose-Capillary: 124 mg/dL — ABNORMAL HIGH (ref 65–99)

## 2015-06-06 LAB — DIC (DISSEMINATED INTRAVASCULAR COAGULATION) PANEL
APTT: 33 s (ref 24–37)
FIBRINOGEN: 175 mg/dL — AB (ref 204–475)
PLATELETS: 98 10*3/uL — AB (ref 150–400)

## 2015-06-06 LAB — BLOOD GAS, ARTERIAL
ACID-BASE DEFICIT: 4.2 mmol/L — AB (ref 0.0–2.0)
Bicarbonate: 20.8 mEq/L (ref 20.0–24.0)
Drawn by: 308601
FIO2: 0.4
LHR: 21 {breaths}/min
MECHVT: 450 mL
O2 Saturation: 97.8 %
PEEP/CPAP: 5 cmH2O
Patient temperature: 35
TCO2: 20.3 mmol/L (ref 0–100)
pCO2 arterial: 37 mmHg (ref 35.0–45.0)
pH, Arterial: 7.358 (ref 7.350–7.450)
pO2, Arterial: 89.2 mmHg (ref 80.0–100.0)

## 2015-06-06 LAB — RENAL FUNCTION PANEL
ANION GAP: 4 — AB (ref 5–15)
Albumin: 1.6 g/dL — ABNORMAL LOW (ref 3.5–5.0)
BUN: 14 mg/dL (ref 6–20)
CO2: 23 mmol/L (ref 22–32)
Calcium: 7.4 mg/dL — ABNORMAL LOW (ref 8.9–10.3)
Chloride: 119 mmol/L — ABNORMAL HIGH (ref 101–111)
Creatinine, Ser: 1.31 mg/dL — ABNORMAL HIGH (ref 0.44–1.00)
GFR calc Af Amer: 43 mL/min — ABNORMAL LOW (ref >60)
GFR calc non Af Amer: 37 mL/min — ABNORMAL LOW (ref >60)
GLUCOSE: 95 mg/dL (ref 65–99)
POTASSIUM: 3.8 mmol/L (ref 3.5–5.1)
Phosphorus: 3.1 mg/dL (ref 2.5–4.6)
Sodium: 146 mmol/L — ABNORMAL HIGH (ref 135–145)

## 2015-06-06 LAB — HEMOGLOBIN AND HEMATOCRIT, BLOOD
HCT: 22.1 % — ABNORMAL LOW (ref 36.0–46.0)
HEMATOCRIT: 23.9 % — AB (ref 36.0–46.0)
HEMOGLOBIN: 7.6 g/dL — AB (ref 12.0–15.0)
Hemoglobin: 8 g/dL — ABNORMAL LOW (ref 12.0–15.0)

## 2015-06-06 LAB — DIC (DISSEMINATED INTRAVASCULAR COAGULATION)PANEL
D-Dimer, Quant: 3.47 ug/mL-FEU — ABNORMAL HIGH (ref 0.00–0.50)
INR: 1.63 — ABNORMAL HIGH (ref 0.00–1.49)
Prothrombin Time: 18.8 seconds — ABNORMAL HIGH (ref 11.6–15.2)
Smear Review: NONE SEEN

## 2015-06-06 LAB — PROTIME-INR
INR: 1.51 — ABNORMAL HIGH (ref 0.00–1.49)
INR: 1.48 (ref 0.00–1.49)
PROTHROMBIN TIME: 17.7 s — AB (ref 11.6–15.2)
Prothrombin Time: 17.5 seconds — ABNORMAL HIGH (ref 11.6–15.2)

## 2015-06-06 LAB — CBC
HEMATOCRIT: 20.4 % — AB (ref 36.0–46.0)
HEMOGLOBIN: 7 g/dL — AB (ref 12.0–15.0)
MCH: 30.4 pg (ref 26.0–34.0)
MCHC: 34.3 g/dL (ref 30.0–36.0)
MCV: 88.7 fL (ref 78.0–100.0)
Platelets: 99 10*3/uL — ABNORMAL LOW (ref 150–400)
RBC: 2.3 MIL/uL — AB (ref 3.87–5.11)
RDW: 16.1 % — ABNORMAL HIGH (ref 11.5–15.5)
WBC: 7.8 10*3/uL (ref 4.0–10.5)

## 2015-06-06 LAB — APTT: aPTT: 33 seconds (ref 24–37)

## 2015-06-06 LAB — MAGNESIUM: Magnesium: 1.9 mg/dL (ref 1.7–2.4)

## 2015-06-06 SURGERY — LAPAROTOMY, EXPLORATORY
Anesthesia: General | Site: Abdomen

## 2015-06-06 MED ORDER — ROCURONIUM BROMIDE 100 MG/10ML IV SOLN
INTRAVENOUS | Status: AC
  Filled 2015-06-06: qty 1

## 2015-06-06 MED ORDER — IOPAMIDOL (ISOVUE-300) INJECTION 61%
100.0000 mL | Freq: Once | INTRAVENOUS | Status: AC | PRN
  Administered 2015-06-06: 100 mL via INTRAVENOUS

## 2015-06-06 MED ORDER — ETOMIDATE 2 MG/ML IV SOLN
INTRAVENOUS | Status: DC | PRN
  Administered 2015-06-06: 16 mg via INTRAVENOUS

## 2015-06-06 MED ORDER — CHLORHEXIDINE GLUCONATE 0.12% ORAL RINSE (MEDLINE KIT)
15.0000 mL | Freq: Two times a day (BID) | OROMUCOSAL | Status: DC
  Administered 2015-06-06 – 2015-06-20 (×21): 15 mL via OROMUCOSAL

## 2015-06-06 MED ORDER — PROPOFOL 10 MG/ML IV BOLUS
INTRAVENOUS | Status: AC
  Filled 2015-06-06: qty 20

## 2015-06-06 MED ORDER — ROCURONIUM BROMIDE 100 MG/10ML IV SOLN
INTRAVENOUS | Status: DC | PRN
  Administered 2015-06-06: 20 mg via INTRAVENOUS
  Administered 2015-06-06 (×2): 30 mg via INTRAVENOUS

## 2015-06-06 MED ORDER — PROPOFOL 1000 MG/100ML IV EMUL
5.0000 ug/kg/min | INTRAVENOUS | Status: DC
  Administered 2015-06-06: 5 ug/kg/min via INTRAVENOUS
  Administered 2015-06-07 (×2): 10 ug/kg/min via INTRAVENOUS
  Administered 2015-06-08: 6 ug/kg/min via INTRAVENOUS
  Filled 2015-06-06 (×4): qty 100

## 2015-06-06 MED ORDER — ANTISEPTIC ORAL RINSE SOLUTION (CORINZ)
7.0000 mL | Freq: Four times a day (QID) | OROMUCOSAL | Status: DC
  Administered 2015-06-07 – 2015-06-20 (×36): 7 mL via OROMUCOSAL

## 2015-06-06 MED ORDER — CEFAZOLIN SODIUM-DEXTROSE 2-3 GM-% IV SOLR
INTRAVENOUS | Status: DC | PRN
  Administered 2015-06-06: 2 g via INTRAVENOUS

## 2015-06-06 MED ORDER — SODIUM CHLORIDE 0.9 % IV SOLN
2.0000 g | Freq: Four times a day (QID) | INTRAVENOUS | Status: DC
  Administered 2015-06-06 – 2015-06-20 (×54): 2 g via INTRAVENOUS
  Filled 2015-06-06 (×61): qty 2000

## 2015-06-06 MED ORDER — CALCIUM CHLORIDE 10 % IV SOLN
INTRAVENOUS | Status: DC | PRN
  Administered 2015-06-06: 13.6 meq via INTRAVENOUS

## 2015-06-06 MED ORDER — ETOMIDATE 2 MG/ML IV SOLN
INTRAVENOUS | Status: AC
  Filled 2015-06-06: qty 10

## 2015-06-06 MED ORDER — CEFAZOLIN SODIUM-DEXTROSE 2-4 GM/100ML-% IV SOLN
INTRAVENOUS | Status: AC
  Filled 2015-06-06: qty 100

## 2015-06-06 MED ORDER — LIDOCAINE HCL (CARDIAC) 20 MG/ML IV SOLN
INTRAVENOUS | Status: DC | PRN
  Administered 2015-06-06: 100 mg via INTRAVENOUS

## 2015-06-06 MED ORDER — SUCCINYLCHOLINE CHLORIDE 20 MG/ML IJ SOLN
INTRAMUSCULAR | Status: DC | PRN
  Administered 2015-06-06: 100 mg via INTRAVENOUS

## 2015-06-06 MED ORDER — SODIUM CHLORIDE 0.9 % IV SOLN
Freq: Once | INTRAVENOUS | Status: AC
  Administered 2015-06-06: 13:00:00 via INTRAVENOUS

## 2015-06-06 MED ORDER — CALCIUM CHLORIDE 10 % IV SOLN
INTRAVENOUS | Status: AC
  Filled 2015-06-06: qty 10

## 2015-06-06 MED ORDER — SODIUM CHLORIDE 0.9 % IV SOLN
Freq: Once | INTRAVENOUS | Status: AC
  Administered 2015-06-06: 12:00:00 via INTRAVENOUS

## 2015-06-06 MED ORDER — LACTATED RINGERS IV SOLN
INTRAVENOUS | Status: DC | PRN
  Administered 2015-06-06: 15:00:00 via INTRAVENOUS

## 2015-06-06 MED ORDER — LIDOCAINE HCL 1 % IJ SOLN
INTRAMUSCULAR | Status: AC
  Filled 2015-06-06: qty 20

## 2015-06-06 MED ORDER — SUFENTANIL CITRATE 50 MCG/ML IV SOLN
INTRAVENOUS | Status: AC
  Filled 2015-06-06: qty 1

## 2015-06-06 MED ORDER — PHENYLEPHRINE HCL 10 MG/ML IJ SOLN
INTRAMUSCULAR | Status: DC | PRN
  Administered 2015-06-06 (×2): 80 ug via INTRAVENOUS

## 2015-06-06 MED ORDER — SODIUM CHLORIDE 0.9 % IV SOLN
Freq: Once | INTRAVENOUS | Status: AC
  Administered 2015-06-07: 17:00:00 via INTRAVENOUS

## 2015-06-06 MED ORDER — LIDOCAINE HCL (CARDIAC) 20 MG/ML IV SOLN
INTRAVENOUS | Status: AC
  Filled 2015-06-06: qty 5

## 2015-06-06 MED ORDER — SUFENTANIL CITRATE 50 MCG/ML IV SOLN
INTRAVENOUS | Status: DC | PRN
  Administered 2015-06-06: 10 ug via INTRAVENOUS

## 2015-06-06 SURGICAL SUPPLY — 48 items
APPLICATOR COTTON TIP 6IN STRL (MISCELLANEOUS) IMPLANT
BLADE EXTENDED COATED 6.5IN (ELECTRODE) ×2 IMPLANT
BLADE HEX COATED 2.75 (ELECTRODE) IMPLANT
CLIP TI LARGE 6 (CLIP) ×4 IMPLANT
CLIP TI MEDIUM 6 (CLIP) ×4 IMPLANT
COVER MAYO STAND STRL (DRAPES) ×2 IMPLANT
COVER SURGICAL LIGHT HANDLE (MISCELLANEOUS) IMPLANT
DRAIN CHANNEL 19F RND (DRAIN) ×2 IMPLANT
DRAPE LAPAROSCOPIC ABDOMINAL (DRAPES) IMPLANT
DRAPE UTILITY XL STRL (DRAPES) IMPLANT
DRAPE WARM FLUID 44X44 (DRAPE) ×2 IMPLANT
DRSG OPSITE POSTOP 4X10 (GAUZE/BANDAGES/DRESSINGS) ×2 IMPLANT
DRSG TEGADERM 4X4.75 (GAUZE/BANDAGES/DRESSINGS) ×2 IMPLANT
ELECT REM PT RETURN 9FT ADLT (ELECTROSURGICAL) ×2
ELECTRODE REM PT RTRN 9FT ADLT (ELECTROSURGICAL) ×1 IMPLANT
EVACUATOR DRAINAGE 10X20 100CC (DRAIN) IMPLANT
EVACUATOR SILICONE 100CC (DRAIN)
GAUZE SPONGE 4X4 12PLY STRL (GAUZE/BANDAGES/DRESSINGS) ×2 IMPLANT
GLOVE ECLIPSE 8.0 STRL XLNG CF (GLOVE) ×20 IMPLANT
GLOVE INDICATOR 8.0 STRL GRN (GLOVE) ×4 IMPLANT
GOWN STRL REUS W/TWL XL LVL3 (GOWN DISPOSABLE) ×12 IMPLANT
HANDLE SUCTION POOLE (INSTRUMENTS) ×1 IMPLANT
KIT BASIN OR (CUSTOM PROCEDURE TRAY) ×2 IMPLANT
NS IRRIG 1000ML POUR BTL (IV SOLUTION) ×4 IMPLANT
PACK GENERAL/GYN (CUSTOM PROCEDURE TRAY) ×2 IMPLANT
SPONGE DRAIN TRACH 4X4 STRL 2S (GAUZE/BANDAGES/DRESSINGS) ×2 IMPLANT
SPONGE LAP 18X18 X RAY DECT (DISPOSABLE) ×6 IMPLANT
STAPLER VISISTAT 35W (STAPLE) ×2 IMPLANT
SUCTION POOLE HANDLE (INSTRUMENTS) ×2
SUT ETHILON 2 0 PS N (SUTURE) ×2 IMPLANT
SUT PDS AB 1 CTX 36 (SUTURE) IMPLANT
SUT PDS AB 1 TP1 96 (SUTURE) ×4 IMPLANT
SUT SILK 2 0 (SUTURE) ×1
SUT SILK 2 0 SH CR/8 (SUTURE) IMPLANT
SUT SILK 2 0SH CR/8 30 (SUTURE) ×4 IMPLANT
SUT SILK 2-0 18XBRD TIE 12 (SUTURE) IMPLANT
SUT SILK 2-0 30XBRD TIE 12 (SUTURE) ×1 IMPLANT
SUT SILK 3 0 (SUTURE)
SUT SILK 3 0 SH CR/8 (SUTURE) ×8 IMPLANT
SUT SILK 3-0 18XBRD TIE 12 (SUTURE) IMPLANT
SUT VIC AB 3-0 SH 18 (SUTURE) IMPLANT
SUT VICRYL 2 0 18  UND BR (SUTURE)
SUT VICRYL 2 0 18 UND BR (SUTURE) IMPLANT
TOWEL OR 17X26 10 PK STRL BLUE (TOWEL DISPOSABLE) ×8 IMPLANT
TOWEL OR NON WOVEN STRL DISP B (DISPOSABLE) ×2 IMPLANT
TRAY FOLEY W/METER SILVER 14FR (SET/KITS/TRAYS/PACK) IMPLANT
TRAY FOLEY W/METER SILVER 16FR (SET/KITS/TRAYS/PACK) IMPLANT
YANKAUER SUCT BULB TIP 10FT TU (MISCELLANEOUS) ×2 IMPLANT

## 2015-06-06 NOTE — Transfer of Care (Signed)
Immediate Anesthesia Transfer of Care Note  Patient: Jasmine Newman  Procedure(s) Performed: Procedure(s): EXPLORATORY LAPAROTOMY OVERSEWING OF BLEEDING DUODENAL ULCER (N/A)  Patient Location: PACU and ICU  Anesthesia Type:General  Level of Consciousness: Patient remains intubated per anesthesia plan  Airway & Oxygen Therapy: Patient remains intubated per anesthesia plan  Post-op Assessment: Report given to RN and Post -op Vital signs reviewed and stable  Post vital signs: Reviewed and stable  Last Vitals:  Filed Vitals:   06/06/15 1430 06/06/15 1445  BP: 101/49 101/56  Pulse: 79 74  Temp: 36.5 C 36.4 C  Resp: 14 12    Complications: No apparent anesthesia complications

## 2015-06-06 NOTE — Procedures (Signed)
Angio and embo See report

## 2015-06-06 NOTE — Addendum Note (Signed)
Addendum  created 06/06/15 1835 by Elisabeth CaraLacey A Shantae Vantol, CRNA   Modules edited: Charges VN

## 2015-06-06 NOTE — Progress Notes (Signed)
eLink Physician-Brief Progress Note Patient Name: [name] DOB: 1931-07-03 MRN: [id number]   Date of Service  06/06/2015  HPI/Events of Note  Patient returns from OR intubated and ventilated. Portable CXR already ordered.   eICU Interventions  Will order: 1. ABG and CBC now.  2. Propofol IV infusion. Titrate to RASS = -1 to -2.  3. Monitor CVP. 4. Ventilator settings: 50%/PRVC 16/TV 450/P 5.     Intervention Category Major Interventions: Respiratory failure - evaluation and management  Sommer,Steven Eugene 06/06/2015, 6:01 PM

## 2015-06-06 NOTE — Anesthesia Postprocedure Evaluation (Signed)
Anesthesia Post Note  Patient: Timoteo ExposeBetty G Spainhower  Procedure(s) Performed: Procedure(s) (LRB): EXPLORATORY LAPAROTOMY OVERSEWING OF BLEEDING DUODENAL ULCER (N/A)  Patient location during evaluation: SICU Anesthesia Type: General Level of consciousness: patient remains intubated per anesthesia plan and sedated Pain management: pain level controlled Vital Signs Assessment: post-procedure vital signs reviewed and stable Respiratory status: patient on ventilator - see flowsheet for VS and patient remains intubated per anesthesia plan Cardiovascular status: stable Anesthetic complications: no    Last Vitals:  Filed Vitals:   06/06/15 1445 06/06/15 1745  BP: 101/56 119/84  Pulse: 74 119  Temp: 36.4 C   Resp: 12 16    Last Pain:  Filed Vitals:   06/06/15 1749  PainSc: 0-No pain                 Manus Weedman EDWARD

## 2015-06-06 NOTE — Op Note (Signed)
Operative Note  Jasmine PonsBetty G Newman female 82 y.o. 06/06/2015  PREOPERATIVE DX:  Bleeding giant duodenal ulcer  POSTOPERATIVE DX:  Same  PROCEDURE:   Emergency exploratory laparotomy with oversewing of bleeding giant duodenal ulcer         Surgeon: Adolph PollackOSENBOWER,Elvina Bosch J   Assistants: Wenda LowMatt Martin M.D., [place]egan Baird, [place]  Anesthesia: General endotracheal anesthesia  Indications:   This is an 82 year old female with bleeding giant duodenal ulcer. She had a myocardial infarction 3 days ago. She has continued to have bleeding and has failed endoscopic therapy as well as attempts at angioembolization.  She realizes this is a last effort to try to control the bleeding and she is very high risk because of her recent myocardial infarction. She is brought to the operating room for emergency exploratory laparotomy.    Procedure Detail:  She is brought to the operating room placed supine on the operating table and a general anesthetic was administered. The abdominal wall was sterilely prepped and draped. A timeout was performed.  An upper midline incision was made dividing the skin, subcutaneous tissue, fascia, and peritoneum. A self-retaining retractor was used for exposure. The pylorus of the stomach was identified. There is some hemorrhagic areas both superiorly and inferiorly around the duodenal bulb. A Kocher maneuver was performed.  An incision was made beginning at the first portion the duodenum and extending across the pylorus into the stomach. Fresh blood was noted in the duodenal bulb area. There is a large, 3 cm posterior and inferior ulceration noted was some oozing present. I had the anesthesiologist decreased the vasoconstrictor and more oozing was noted. I was able to oversew the ulcer using 3 stitches and control the bleeding in this fashion. I then observed the area for multiple minutes and did not notice any other evidence of bleeding from this large ulcer.  I then closed the defect in a  Heineke-Mikulicz pyloroplasty type fashion using interrupted 3-0 silk sutures. An omental patch was placed over the closure. A stab wound was made in the right lower quadrant. A #19 round Blake drain was then brought into the abdominal cavity and placed posterior to the area of the closure. It was anchored to the skin with nylon suture. The abdominal cavity was then copiously irrigated with saline. Needle sponge and instrument counts were reported to be correct.  An NG tube position was confirmed. The fascia was then closed with running looped #1 PDS suture. Subcutaneous tissue was irrigated and the skin was closed with staples. A sterile dressing was applied.  She tolerated the procedure without any apparent complications. She was taken to the intensive care unit in critical condition. She received multiple blood products during the operation.

## 2015-06-06 NOTE — Anesthesia Preprocedure Evaluation (Addendum)
Anesthesia Evaluation  Patient identified by MRN, date of birth, ID band Patient awake, Patient confused and Patient unresponsive    Reviewed: Allergy & Precautions, NPO status , Patient's Chart, lab work & pertinent test results, reviewed documented beta blocker date and time   Airway Mallampati: III  TM Distance: >3 FB Neck ROM: Full    Dental  (+) Dental Advisory Given   Pulmonary former smoker,    Pulmonary exam normal breath sounds clear to auscultation       Cardiovascular hypertension, Pt. on medications and Pt. on home beta blockers + Past MI and + Cardiac Stents   Rhythm:Regular Rate:Normal  Has not taken BP meds since 06/01/2015 Patient hypotensive Atrial Fibrillation; LBBB MI 3 days ago Echo 06/03/2015 LVEF 45-50% Akinesis of the anteroseptal wall with overall mildly reduced LV function; grade 2 diastolic dysfunction; elevated LV fillin pressure; s/p bioprosthetic aortic valve with mildly elevated gradient (mean 23 mmHg); severe MR; moderate TR; mildly elevated pulmonary pressure.   Neuro/Psych Depression negative neurological ROS     GI/Hepatic Neg liver ROS, PUD,   Endo/Other  Hyperlipidemia Obesity  Renal/GU Renal InsufficiencyRenal disease  negative genitourinary   Musculoskeletal  (+) Arthritis ,   Abdominal (+) + obese,   Peds  Hematology  (+) anemia , Coagulopathy   Anesthesia Other Findings   Reproductive/Obstetrics                        Anesthesia Physical Anesthesia Plan  ASA: IV and emergent  Anesthesia Plan: General   Post-op Pain Management:    Induction: Intravenous  Airway Management Planned: Oral ETT  Additional Equipment: Arterial line  Intra-op Plan:   Post-operative Plan: Post-operative intubation/ventilation  Informed Consent: I have reviewed the patients History and Physical, chart, labs and discussed the procedure including the risks, benefits and  alternatives for the proposed anesthesia with the patient or authorized representative who has indicated his/her understanding and acceptance.   Dental advisory given  Plan Discussed with: CRNA, Anesthesiologist and Surgeon  Anesthesia Plan Comments:         Anesthesia Quick Evaluation

## 2015-06-06 NOTE — Progress Notes (Signed)
Dr. Daphine DeutscherMartin paged regarding pts hemoglobin level. Pt came up with two units of blood from PACU and was told to transfuse per MD. After pt arrived on unit a CBC was drawn per PCCM. Hemoglobin came back at 7.4 with a CVP of 19-20. Pt has has neo of since arrival around 17:30. Per Dr. Daphine DeutscherMartin, do not transfuse 2 units of PRBC and monitor closely.

## 2015-06-06 NOTE — Progress Notes (Signed)
Pharmacy Antibiotic Note  Shawnie PonsBetty G Tullis is a 82 y.o. female with PMH of bioprosthetic valve admitted on 06/01/2015 with sepsis 2/2 Enterococcus bacteremia, UTI and bleeding GI and duodenal ulcer.  Pharmacy has been consulted to adjust antibiotics for renal function.   Plan: Ampicillin to 2g IV q6h for decreased renal function. Monitor renal function and adjust dose as needed. F/u ID note for further recommendations.  Height: 5\' 5"  (165.1 cm) Weight: 219 lb 2.2 oz (99.4 kg) IBW/kg (Calculated) : 57  Temp (24hrs), Avg:97.8 F (36.6 C), Min:97.2 F (36.2 C), Max:98.2 F (36.8 C)   Recent Labs Lab 06/01/15 1529  06/01/15 2054 06/02/15 0034 06/02/15 1656 06/03/15 0255 06/04/15 0400 06/04/15 0420 06/05/15 0430 06/06/15 0200  WBC  --   < >  --  13.6*  --  10.1 9.9  --  7.5 10.8*  CREATININE  --   --   --  2.04* 1.72* 1.56*  --  1.38* 1.36* 1.31*  LATICACIDVEN 0.87  --  0.7 1.0  --   --   --   --   --   --   < > = values in this interval not displayed.  Estimated Creatinine Clearance: 39.3 mL/min (by C-G formula based on Cr of 1.31).    No Known Allergies  Antimicrobials this admission: 4/2 >> Unasyn >> 4/3 4/3 >> Vancomycin >> 4/5 4/3 >> CTX >> 4/5 4/5 >> Ampicillin per MD >>  Dose adjustments this admission: 4/7: ampicillin adjusted from q4h to q6h for renal fxn  Microbiology results: 4/2 BCx: 1/2 Enterococcus (sens amp, vanc) 4/2 UCx (1137): >100k Enterococcus (sens pending) 4/2 UCx (1630): >100k Enterococcus (sens amp, levo, nitro, vanc) 4/2 MRSA PCR: neg 4/5 BCx: 1/2 Enterococcus  Thank you for allowing pharmacy to be a part of this patient's care.  Greer PickerelJigna Redell Bhandari, PharmD, BCPS Pager: [phone] 06/06/2015 3:22 PM

## 2015-06-06 NOTE — Progress Notes (Signed)
Called ABG results to eLink.  Lab notified to exit out of "Pt process" so results can be posted to EPIC.  PH 7.312 C02 39.7 02 115  HC03 19.5  Vent settings: Vt 450 RR 16 Peep 5.0 Fi02 .30

## 2015-06-06 NOTE — Consult Note (Signed)
[facility] Surgery Consult Note  Jasmine Newman 06/14/1931  [id number].    Requesting MD: Dr. Penelope Coop Chief Complaint/Reason for Consult: Large bleeding duodenal ulcer  HPI:  82 y/o white female with PMH of AFIB - no blood thinners on med list, hypothyroidism, hypertension, hyperlipidemia, depression. Admitted on 06/01/15 after she had a fall at home. She is watching the [facility] basketball game with her husband. When she stood up to use the bathroom her legs gave out and she fell on the right side.  She on the floor the whole night because her husband couldn't get her up. When her son arrived next morning she was found to be lying in a large pool of melanotic stool.  She underwent a EGD by Dr. Penelope Coop on Monday night 06/02/15.  She was found to have a large >3cm posterior wall duodenal ulcer which was had a large adherent clot.  He injected it with epinephrine.  She had been doing well until yesterday when she re-bled.  She went urgently to IR and had a successful angio embolization of her GDA.  She had been doing okay overnight, but this am became hypotensive and having continual straight bloody stools (at least 4 this am).  3 units of blood given.  Hgb this am is 8.0. She denies abdominal pain, N/V, CP/SOB.  No h/o GI bleed in the past.  No h/o ulcer disease.  Of note she has had an NSTEMI vs demand ischemia 2* anemia.  Her TEE on 06/03/15 showed moderate aortic stenosis, severe mitral regurg, and abnormal wall motion.  Mildly coagulopathic with INR 1.51, platelets 106.  No h/o abdominal surgeries per patient.  In IV protonix drip.  IR pending repeat angio, OR is on hold for possible exploratory laparotomy.  Husband at bedside.   ROS: All systems reviewed and otherwise negative except for as above  History reviewed. No pertinent family history.  Past Medical History  Diagnosis Date  . Hypertension     Past Surgical History  Procedure Laterality Date  . Esophagogastroduodenoscopy N/A 06/02/2015     Procedure: ESOPHAGOGASTRODUODENOSCOPY (EGD);  Surgeon: Wonda Horner, MD;  Location: [facility];  Service: Endoscopy;  Laterality: N/A;    Social History:  reports that she has quit smoking. She does not have any smokeless tobacco history on file. Her alcohol and drug histories are not on file.  Allergies: No Known Allergies  Medications Prior to Admission  Medication Sig Dispense Refill  . buPROPion (WELLBUTRIN XL) 150 MG 24 hr tablet Take 1 tablet (150 mg total) by mouth daily. (Patient not taking: Reported on 06/01/2015) 90 tablet 2  . DULoxetine (CYMBALTA) 60 MG capsule Take 1 capsule (60 mg total) by mouth daily. (Patient not taking: Reported on 06/01/2015) 90 capsule 2  . levothyroxine (SYNTHROID, LEVOTHROID) 50 MCG tablet Take 1 tablet (50 mcg total) by mouth daily. (Patient not taking: Reported on 06/01/2015) 90 tablet 2  . lisinopril (PRINIVIL,ZESTRIL) 5 MG tablet Take 1 tablet (5 mg total) by mouth daily. (Patient not taking: Reported on 06/01/2015) 90 tablet 2  . metoprolol (LOPRESSOR) 50 MG tablet Take 1 tablet (50 mg total) by mouth 2 (two) times daily. (Patient not taking: Reported on 06/01/2015) 180 tablet 2  . simvastatin (ZOCOR) 40 MG tablet Take 1 tablet (40 mg total) by mouth every evening. (Patient not taking: Reported on 06/01/2015) 90 tablet 2    Blood pressure 90/39, pulse 85, temperature 98.1 F (36.7 C), temperature source Core (Comment), resp. rate 28, height  5' 5"  (1.651 m), weight 99.4 kg (219 lb 2.2 oz), SpO2 95 %. Physical Exam: General: pleasant, WD/WN white female who is laying in bed in acute distress due to hypotension, tachypnic HEENT: head is normocephalic, atraumatic.  Sclera are noninjected.  PERRL.  Ears and nose without any masses or lesions.  Mouth is pink and moist Heart: regular rate.  Very soft heart sounds, no obvious murmurs, gallops, or rubs noted.  Difficult to palpate distal pulses. Lungs: CTAB, no wheezes, rhonchi, or rales noted.  Respiratory effort  nonlabored, good effort. Abd: soft, NT/ND, +BS, no masses, hernias, or organomegaly.  Large amounts of frank blood from rectum (draining continuously). MS: all 4 extremities with pitting edema Skin: warm and dry, scattered ecchysmosis Psych: A&Ox3 with an appropriate affect, scared/anxious   Results for orders placed or performed during the hospital encounter of 06/01/15 (from the past 48 hour(s))  CBC with Differential/Platelet     Status: Abnormal   Collection Time: 06/05/15  4:30 AM  Result Value Ref Range   WBC 7.5 4.0 - 10.5 K/uL   RBC 2.79 (L) 3.87 - 5.11 MIL/uL   Hemoglobin 7.9 (L) 12.0 - 15.0 g/dL   HCT 24.6 (L) 36.0 - 46.0 %   MCV 88.2 78.0 - 100.0 fL   MCH 28.3 26.0 - 34.0 pg   MCHC 32.1 30.0 - 36.0 g/dL   RDW 19.5 (H) 11.5 - 15.5 %   Platelets 119 (L) 150 - 400 K/uL    Comment: SPECIMEN CHECKED FOR CLOTS PLATELET COUNT CONFIRMED BY SMEAR    Neutrophils Relative % 65 %   Lymphocytes Relative 20 %   Monocytes Relative 12 %   Eosinophils Relative 3 %   Basophils Relative 0 %   Neutro Abs 4.9 1.7 - 7.7 K/uL   Lymphs Abs 1.5 0.7 - 4.0 K/uL   Monocytes Absolute 0.9 0.1 - 1.0 K/uL   Eosinophils Absolute 0.2 0.0 - 0.7 K/uL   Basophils Absolute 0.0 0.0 - 0.1 K/uL   RBC Morphology POLYCHROMASIA PRESENT   Renal function panel     Status: Abnormal   Collection Time: 06/05/15  4:30 AM  Result Value Ref Range   Sodium 144 135 - 145 mmol/L   Potassium 3.8 3.5 - 5.1 mmol/L   Chloride 114 (H) 101 - 111 mmol/L   CO2 25 22 - 32 mmol/L   Glucose, Bld 98 65 - 99 mg/dL   BUN 16 6 - 20 mg/dL   Creatinine, Ser 1.36 (H) 0.44 - 1.00 mg/dL   Calcium 7.7 (L) 8.9 - 10.3 mg/dL   Phosphorus 2.6 2.5 - 4.6 mg/dL   Albumin 1.9 (L) 3.5 - 5.0 g/dL   GFR calc non Af Amer 35 (L) >60 mL/min   GFR calc Af Amer 41 (L) >60 mL/min    Comment: (NOTE) The eGFR has been calculated using the CKD EPI equation. This calculation has not been validated in all clinical situations. eGFR's persistently <60  mL/min signify possible Chronic Kidney Disease.    Anion gap 5 5 - 15  Magnesium     Status: None   Collection Time: 06/05/15  4:30 AM  Result Value Ref Range   Magnesium 2.1 1.7 - 2.4 mg/dL  Prepare RBC     Status: None   Collection Time: 06/05/15  8:00 AM  Result Value Ref Range   Order Confirmation ORDER PROCESSED BY BLOOD BANK   Type and screen [place]     Status: None (Preliminary  result)   Collection Time: 06/05/15  8:00 AM  Result Value Ref Range   ABO/RH(D) O POS    Antibody Screen NEG    Sample Expiration 06/08/2015    Unit Number [id number]    Blood Component Type RED CELLS,LR    Unit division 00    Status of Unit ISSUED,FINAL    Transfusion Status OK TO TRANSFUSE    Crossmatch Result Compatible    Unit Number [id number]    Blood Component Type RBC LR PHER1    Unit division 00    Status of Unit ISSUED,FINAL    Transfusion Status OK TO TRANSFUSE    Crossmatch Result Compatible    Unit Number [id number]    Blood Component Type RBC LR PHER1    Unit division 00    Status of Unit ISSUED,FINAL    Transfusion Status OK TO TRANSFUSE    Crossmatch Result Compatible    Unit Number [id number]    Blood Component Type RED CELLS,LR    Unit division 00    Status of Unit ISSUED    Transfusion Status OK TO TRANSFUSE    Crossmatch Result Compatible    Unit Number [id number]    Blood Component Type RBC LR PHER2    Unit division 00    Status of Unit ALLOCATED    Transfusion Status OK TO TRANSFUSE    Crossmatch Result Compatible    Unit Number [id number]    Blood Component Type RED CELLS,LR    Unit division 00    Status of Unit ALLOCATED    Transfusion Status OK TO TRANSFUSE    Crossmatch Result Compatible    Unit Number [id number]    Blood Component Type RED CELLS,LR    Unit division 00    Status of Unit ALLOCATED    Transfusion Status OK TO TRANSFUSE    Crossmatch Result Compatible   Glucose, capillary     Status: Abnormal    Collection Time: 06/05/15  8:13 AM  Result Value Ref Range   Glucose-Capillary 125 (H) 65 - 99 mg/dL   Comment 1 Notify RN    Comment 2 Document in Chart   Glucose, capillary     Status: Abnormal   Collection Time: 06/05/15 11:05 AM  Result Value Ref Range   Glucose-Capillary 105 (H) 65 - 99 mg/dL   Comment 1 Notify RN    Comment 2 Document in Chart   Hemoglobin and hematocrit, blood     Status: Abnormal   Collection Time: 06/05/15  1:41 PM  Result Value Ref Range   Hemoglobin 7.5 (L) 12.0 - 15.0 g/dL   HCT 22.1 (L) 36.0 - 46.0 %  Glucose, capillary     Status: None   Collection Time: 06/05/15  4:28 PM  Result Value Ref Range   Glucose-Capillary 86 65 - 99 mg/dL   Comment 1 Notify RN    Comment 2 Document in Chart   Glucose, capillary     Status: None   Collection Time: 06/05/15  7:23 PM  Result Value Ref Range   Glucose-Capillary 92 65 - 99 mg/dL   Comment 1 Notify RN    Comment 2 Document in Chart   Prepare RBC     Status: None   Collection Time: 06/05/15  8:00 PM  Result Value Ref Range   Order Confirmation ORDER PROCESSED BY BLOOD BANK   Hemoglobin and hematocrit, blood     Status: Abnormal   Collection Time: 06/05/15  8:23 PM  Result Value  Ref Range   Hemoglobin 6.8 (LL) 12.0 - 15.0 g/dL    Comment: REPEATED TO VERIFY CRITICAL RESULT CALLED TO, READ BACK BY AND VERIFIED WITH: S.ODOM,RN AT 2048 ON 06/05/15 BY W.SHEA    HCT 20.2 (L) 36.0 - 46.0 %  Glucose, capillary     Status: None   Collection Time: 06/05/15 11:48 PM  Result Value Ref Range   Glucose-Capillary 89 65 - 99 mg/dL  Hemoglobin and hematocrit, blood     Status: Abnormal   Collection Time: 06/06/15 12:33 AM  Result Value Ref Range   Hemoglobin 7.6 (L) 12.0 - 15.0 g/dL   HCT 22.1 (L) 36.0 - 46.0 %  CBC with Differential/Platelet     Status: Abnormal   Collection Time: 06/06/15  2:00 AM  Result Value Ref Range   WBC 10.8 (H) 4.0 - 10.5 K/uL   RBC 2.70 (L) 3.87 - 5.11 MIL/uL   Hemoglobin 7.9 (L)  12.0 - 15.0 g/dL   HCT 23.4 (L) 36.0 - 46.0 %   MCV 86.7 78.0 - 100.0 fL   MCH 29.3 26.0 - 34.0 pg   MCHC 33.8 30.0 - 36.0 g/dL   RDW 17.7 (H) 11.5 - 15.5 %   Platelets 106 (L) 150 - 400 K/uL    Comment: CONSISTENT WITH PREVIOUS RESULT   Neutrophils Relative % 67 %   Neutro Abs 7.2 1.7 - 7.7 K/uL   Lymphocytes Relative 20 %   Lymphs Abs 2.1 0.7 - 4.0 K/uL   Monocytes Relative 11 %   Monocytes Absolute 1.2 (H) 0.1 - 1.0 K/uL   Eosinophils Relative 2 %   Eosinophils Absolute 0.2 0.0 - 0.7 K/uL   Basophils Relative 0 %   Basophils Absolute 0.0 0.0 - 0.1 K/uL  Renal function panel     Status: Abnormal   Collection Time: 06/06/15  2:00 AM  Result Value Ref Range   Sodium 146 (H) 135 - 145 mmol/L   Potassium 3.8 3.5 - 5.1 mmol/L   Chloride 119 (H) 101 - 111 mmol/L   CO2 23 22 - 32 mmol/L   Glucose, Bld 95 65 - 99 mg/dL   BUN 14 6 - 20 mg/dL   Creatinine, Ser 1.31 (H) 0.44 - 1.00 mg/dL   Calcium 7.4 (L) 8.9 - 10.3 mg/dL   Phosphorus 3.1 2.5 - 4.6 mg/dL   Albumin 1.6 (L) 3.5 - 5.0 g/dL   GFR calc non Af Amer 37 (L) >60 mL/min   GFR calc Af Amer 43 (L) >60 mL/min    Comment: (NOTE) The eGFR has been calculated using the CKD EPI equation. This calculation has not been validated in all clinical situations. eGFR's persistently <60 mL/min signify possible Chronic Kidney Disease.    Anion gap 4 (L) 5 - 15  Magnesium     Status: None   Collection Time: 06/06/15  2:00 AM  Result Value Ref Range   Magnesium 1.9 1.7 - 2.4 mg/dL  Protime-INR     Status: Abnormal   Collection Time: 06/06/15  3:30 AM  Result Value Ref Range   Prothrombin Time 17.7 (H) 11.6 - 15.2 seconds   INR 1.51 (H) 0.00 - 1.49  APTT     Status: None   Collection Time: 06/06/15  3:30 AM  Result Value Ref Range   aPTT 33 24 - 37 seconds  Glucose, capillary     Status: None   Collection Time: 06/06/15  3:54 AM  Result Value Ref Range   Glucose-Capillary 86  65 - 99 mg/dL  Glucose, capillary     Status: None    Collection Time: 06/06/15  8:09 AM  Result Value Ref Range   Glucose-Capillary 90 65 - 99 mg/dL   Comment 1 Notify RN    Comment 2 Document in Chart   Hemoglobin and hematocrit, blood     Status: Abnormal   Collection Time: 06/06/15  8:28 AM  Result Value Ref Range   Hemoglobin 8.0 (L) 12.0 - 15.0 g/dL   HCT 23.9 (L) 36.0 - 46.0 %   Ir Angiogram Visceral Selective  06/05/2015  INDICATION: 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 ANESTHESIA/SEDATION: 50 MCG FENTANYL ONLY Moderate Sedation Time: NONE. The patient's level of consciousness and vital signs were monitored continuously by radiology nursing throughout the procedure under my direct supervision. CONTRAST:  77m ISOVUE-300 IOPAMIDOL (ISOVUE-300) INJECTION 61% FLUOROSCOPY TIME:  Fluoroscopy Time: 12 minutes 18 seconds (947 mGy). COMPLICATIONS: None immediate. PROCEDURE: Informed consent was obtained from the patient following explanation of the procedure, risks, benefits and alternatives. The patient understands, agrees and consents for the procedure. All questions were addressed. A time out was performed prior to the initiation of the procedure. Maximal barrier sterile technique utilized including caps, mask, sterile gowns, sterile gloves, large sterile drape, hand hygiene, and Betadine prep. Under sterile conditions and local anesthesia, ultrasound micropuncture access performed of the patent right common femoral artery. Five French sheath inserted over a guidewire. Five French C2 catheter advanced over a Bentson guidewire to the celiac artery. Selective celiac angiogram performed. Celiac: Celiac is widely patent. Left gastric, splenic and hepatic vasculature patent. GDA is patent. There is active extravasation from the  gastroduodenal artery localized to the level of the duodenum compatible with a bleeding duodenal ulcer. Renegade STC micro catheter and GT micro Glidewire were advanced through the C2 catheter into the gastroduodenal artery. Selective gastroduodenal angiogram performed. Gastroduodenal artery: Gastroduodenal artery is patent. Active bleeding present from a small distal branch of the gastroduodenal artery compatible with an active bleeding duodenal ulcer. Embolization: Micro catheter was advanced over the catheter and guidewire to the level of the gastroduodenal artery bleeding site. Repeat injection confirms micro catheter position at the site of active bleeding. Micro coil embolization performed of the gastroduodenal artery with insertion of 3 3 mm x 6 mm interlock coils, 1 4 mm x 8 mm soft coil, 1 4 mm x 12 mm soft coil, and 1 5 mm x 8 mm soft coil. Post embolization angiogram demonstrates occlusion of the gastroduodenal artery with no further active bleeding. Micro catheter was retracted into the common hepatic artery. Common hepatic artery angiogram performed. Common hepatic: This confirms preserved patency of the common, proper, left and right hepatic arteries. Access removed. Hemostasis obtained with manual compression. No immediate complication. Patient tolerated the procedure well. 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 Successful micro coil embolization of the gastroduodenal artery to complete occlusion with no further active bleeding. Electronically Signed   By: MJerilynn Mages  Shick M.D.   On: 06/05/2015 13:27   Ir Angiogram Selective Each Additional Vessel  06/05/2015  INDICATION: Acute upper GI bleeding from a known giant duodenal ulcer by upper endoscopy EXAM: SELECTIVE VISCERAL ARTERIOGRAPHY; IR ULTRASOUND GUIDANCE VASC ACCESS RIGHT; IR EMBO ART  VEN HEMORR LYMPH EXTRAV INC GUIDE ROADMAPPING; ADDITIONAL  ARTERIOGRAPHY MEDICATIONS: None. ANESTHESIA/SEDATION: 50 MCG FENTANYL ONLY Moderate Sedation Time: NONE. The patient's level of consciousness and vital signs were monitored continuously by radiology nursing throughout the procedure under my direct supervision. CONTRAST:  53m ISOVUE-300 IOPAMIDOL (ISOVUE-300) INJECTION 61% FLUOROSCOPY TIME:  Fluoroscopy Time: 12 minutes 18 seconds (947 mGy). COMPLICATIONS: None immediate. PROCEDURE: Informed consent was obtained from the patient following explanation of the procedure, risks, benefits and alternatives. The patient understands, agrees and consents for the procedure. All questions were addressed. A time out was performed prior to the initiation of the procedure. Maximal barrier sterile technique utilized including caps, mask, sterile gowns, sterile gloves, large sterile drape, hand hygiene, and Betadine prep. Under sterile conditions and local anesthesia, ultrasound micropuncture access performed of the patent right common femoral artery. Five French sheath inserted over a guidewire. Five French C2 catheter advanced over a Bentson guidewire to the celiac artery. Selective celiac angiogram performed. Celiac: Celiac is widely patent. Left gastric, splenic and hepatic vasculature patent. GDA is patent. There is active extravasation from the gastroduodenal artery localized to the level of the duodenum compatible with a bleeding duodenal ulcer. Renegade STC micro catheter and GT micro Glidewire were advanced through the C2 catheter into the gastroduodenal artery. Selective gastroduodenal angiogram performed. Gastroduodenal artery: Gastroduodenal artery is patent. Active bleeding present from a small distal branch of the gastroduodenal artery compatible with an active bleeding duodenal ulcer. Embolization: Micro catheter was advanced over the catheter and guidewire to the level of the gastroduodenal artery bleeding site. Repeat injection confirms micro catheter position at  the site of active bleeding. Micro coil embolization performed of the gastroduodenal artery with insertion of 3 3 mm x 6 mm interlock coils, 1 4 mm x 8 mm soft coil, 1 4 mm x 12 mm soft coil, and 1 5 mm x 8 mm soft coil. Post embolization angiogram demonstrates occlusion of the gastroduodenal artery with no further active bleeding. Micro catheter was retracted into the common hepatic artery. Common hepatic artery angiogram performed. Common hepatic: This confirms preserved patency of the common, proper, left and right hepatic arteries. Access removed. Hemostasis obtained with manual compression. No immediate complication. Patient tolerated the procedure well. 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 Successful micro coil embolization of the gastroduodenal artery to complete occlusion with no further active bleeding. Electronically Signed   By: MJerilynn Mages  Shick M.D.   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 ANESTHESIA/SEDATION: 50 MCG FENTANYL ONLY Moderate Sedation Time: NONE. The patient's level of consciousness and vital signs were monitored continuously by radiology nursing throughout the procedure under my direct supervision. CONTRAST:  536mISOVUE-300 IOPAMIDOL (ISOVUE-300) INJECTION 61% FLUOROSCOPY TIME:  Fluoroscopy Time: 12 minutes 18 seconds (947 mGy). COMPLICATIONS: None immediate. PROCEDURE: Informed consent was obtained from the patient following explanation of the procedure, risks, benefits and alternatives. The patient understands, agrees and consents for the procedure. All questions were  addressed. A time out was performed prior to the initiation of the procedure. Maximal barrier sterile technique utilized including caps, mask, sterile gowns, sterile gloves, large sterile drape, hand hygiene, and Betadine prep. Under  sterile conditions and local anesthesia, ultrasound micropuncture access performed of the patent right common femoral artery. Five French sheath inserted over a guidewire. Five French C2 catheter advanced over a Bentson guidewire to the celiac artery. Selective celiac angiogram performed. Celiac: Celiac is widely patent. Left gastric, splenic and hepatic vasculature patent. GDA is patent. There is active extravasation from the gastroduodenal artery localized to the level of the duodenum compatible with a bleeding duodenal ulcer. Renegade STC micro catheter and GT micro Glidewire were advanced through the C2 catheter into the gastroduodenal artery. Selective gastroduodenal angiogram performed. Gastroduodenal artery: Gastroduodenal artery is patent. Active bleeding present from a small distal branch of the gastroduodenal artery compatible with an active bleeding duodenal ulcer. Embolization: Micro catheter was advanced over the catheter and guidewire to the level of the gastroduodenal artery bleeding site. Repeat injection confirms micro catheter position at the site of active bleeding. Micro coil embolization performed of the gastroduodenal artery with insertion of 3 3 mm x 6 mm interlock coils, 1 4 mm x 8 mm soft coil, 1 4 mm x 12 mm soft coil, and 1 5 mm x 8 mm soft coil. Post embolization angiogram demonstrates occlusion of the gastroduodenal artery with no further active bleeding. Micro catheter was retracted into the common hepatic artery. Common hepatic artery angiogram performed. Common hepatic: This confirms preserved patency of the common, proper, left and right hepatic arteries. Access removed. Hemostasis obtained with manual compression. No immediate complication. Patient  tolerated the procedure well. 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 Successful micro coil embolization of the gastroduodenal artery to complete occlusion with no further active bleeding. Electronically Signed   By: Jerilynn Mages.  Shick M.D.   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 ANESTHESIA/SEDATION: 50 MCG FENTANYL ONLY Moderate Sedation Time: NONE. The patient's level of consciousness and vital signs were monitored continuously by radiology nursing throughout the procedure under my direct supervision. CONTRAST:  14m ISOVUE-300 IOPAMIDOL (ISOVUE-300) INJECTION 61% FLUOROSCOPY TIME:  Fluoroscopy Time: 12 minutes 18 seconds (947 mGy). COMPLICATIONS: None immediate. PROCEDURE: Informed consent was obtained from the patient following explanation of the procedure, risks, benefits and alternatives. The patient understands, agrees and consents for the procedure. All questions were addressed. A time out was performed prior to the initiation of the procedure. Maximal barrier sterile technique utilized including caps, mask, sterile gowns, sterile gloves, large sterile drape, hand hygiene, and Betadine prep. Under sterile conditions and local anesthesia, ultrasound micropuncture access performed of the patent right common femoral artery. Five French sheath inserted over a guidewire. Five French C2 catheter advanced over a Bentson guidewire to the celiac artery. Selective celiac angiogram performed. Celiac: Celiac is widely patent. Left gastric, splenic and hepatic vasculature patent. GDA is patent. There is active extravasation  from the gastroduodenal artery localized to the level of the duodenum compatible with a bleeding duodenal ulcer. Renegade STC micro catheter and GT micro Glidewire were advanced through the C2 catheter into the gastroduodenal artery. Selective gastroduodenal angiogram performed. Gastroduodenal artery: Gastroduodenal artery is patent. Active bleeding present from a small distal branch of the gastroduodenal artery compatible with an active bleeding duodenal ulcer. Embolization: Micro catheter was  advanced over the catheter and guidewire to the level of the gastroduodenal artery bleeding site. Repeat injection confirms micro catheter position at the site of active bleeding. Micro coil embolization performed of the gastroduodenal artery with insertion of 3 3 mm x 6 mm interlock coils, 1 4 mm x 8 mm soft coil, 1 4 mm x 12 mm soft coil, and 1 5 mm x 8 mm soft coil. Post embolization angiogram demonstrates occlusion of the gastroduodenal artery with no further active bleeding. Micro catheter was retracted into the common hepatic artery. Common hepatic artery angiogram performed. Common hepatic: This confirms preserved patency of the common, proper, left and right hepatic arteries. Access removed. Hemostasis obtained with manual compression. No immediate complication. Patient tolerated the procedure well. 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 Successful micro coil embolization of the gastroduodenal artery to complete occlusion with no further active bleeding. Electronically Signed   By: Jerilynn Mages.  Shick M.D.   On: 06/05/2015 13:27   [place] [facility] [employer]  06/05/2015  INDICATION: Acute upper GI bleeding from a known giant duodenal ulcer by upper endoscopy EXAM: SELECTIVE VISCERAL ARTERIOGRAPHY; IR ULTRASOUND GUIDANCE VASC ACCESS RIGHT; IR EMBO ART VEN HEMORR LYMPH EXTRAV INC GUIDE  ROADMAPPING; ADDITIONAL ARTERIOGRAPHY MEDICATIONS: None. ANESTHESIA/SEDATION: 50 MCG FENTANYL ONLY Moderate Sedation Time: NONE. The patient's level of consciousness and vital signs were monitored continuously by radiology nursing throughout the procedure under my direct supervision. CONTRAST:  38m ISOVUE-300 IOPAMIDOL (ISOVUE-300) INJECTION 61% FLUOROSCOPY TIME:  Fluoroscopy Time: 12 minutes 18 seconds (947 mGy). COMPLICATIONS: None immediate. PROCEDURE: Informed consent was obtained from the patient following explanation of the procedure, risks, benefits and alternatives. The patient understands, agrees and consents for the procedure. All questions were addressed. A time out was performed prior to the initiation of the procedure. Maximal barrier sterile technique utilized including caps, mask, sterile gowns, sterile gloves, large sterile drape, hand hygiene, and Betadine prep. Under sterile conditions and local anesthesia, ultrasound micropuncture access performed of the patent right common femoral artery. Five French sheath inserted over a guidewire. Five French C2 catheter advanced over a Bentson guidewire to the celiac artery. Selective celiac angiogram performed. Celiac: Celiac is widely patent. Left gastric, splenic and hepatic vasculature patent. GDA is patent. There is active extravasation from the gastroduodenal artery localized to the level of the duodenum compatible with a bleeding duodenal ulcer. Renegade STC micro catheter and GT micro Glidewire were advanced through the C2 catheter into the gastroduodenal artery. Selective gastroduodenal angiogram performed. Gastroduodenal artery: Gastroduodenal artery is patent. Active bleeding present from a small distal branch of the gastroduodenal artery compatible with an active bleeding duodenal ulcer. Embolization: Micro catheter was advanced over the catheter and guidewire to the level of the gastroduodenal artery bleeding site. Repeat injection confirms  micro catheter position at the site of active bleeding. Micro coil embolization performed of the gastroduodenal artery with insertion of 3 3 mm x 6 mm interlock coils, 1 4 mm x 8 mm soft coil, 1 4 mm x 12 mm soft coil, and 1 5 mm x 8 mm soft coil. Post embolization angiogram demonstrates occlusion of the gastroduodenal artery with no further active bleeding. Micro catheter was retracted into the common hepatic artery. Common hepatic artery angiogram performed. Common hepatic: This confirms preserved patency of the common, proper, left and right hepatic arteries. Access removed. Hemostasis obtained with manual compression.  No immediate complication. Patient tolerated the procedure well. 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 Successful micro coil embolization of the gastroduodenal artery to complete occlusion with no further active bleeding. Electronically Signed   By: Jerilynn Mages.  Shick M.D.   On: 06/05/2015 13:27   STUDIES:  X ray rib 4/2: No fractures Port CXR 4/2: Questionable right basal hazy opacity. R IJ CVL in mid-SVC. Chronic elevation left hemidiaphragm.  R Knee 2View 4/2: No fracture or dislocation. Port CXR 4/4: Silhouetting of left hemidiaphragm unchanged. Endotracheal tube and central venous catheter in good position. No new focal opacity. TTE 4/4: LV normal in size with EF 45-50%. Akinesis of anterior septal myocardium. Grade 2 diastolic dysfunction. LA & RA normal in size. RV normal in size and function. PASP 47mHg. Moderate aortic stenosis without regurgitation. Severe mitral vegetation without stenosis. Trivial pulmonic regurgitation without stenosis. Moderate tricuspid regurgitation.  CULTURES: Blood Ctx x2 4/2>>> Enterococcus 1/2 bottles Urine Ctx x2 4/2>>> Enterococcus 2/2 cultures MRSA PCR 4/2: Negative   ANTIBIOTICS: Ampicillin 4/5>>> Vancomycin 4/3 - 4/5 Rocephin 4/3 -  4/5 Unasyn 4/2  SIGNIFICANT EVENTS: 4/2 - Admit 4/3 - Intubation & EGD 4/4 - Extubation 4/6- Successful GDA microcoil embo per IR   Assessment/Plan Acute life threatening GI Bleed ABL Anemia Large bleeding duodenal ulcer - posterior wall >3cm Hypotension, coagulopathic -S/p EGD 4/3 - Dr. GPenelope Coop-S/p GDA embolization -S/p 3 units PRBC, FFP, Resuscitation in process, reversing coagulopathy -Emergent IR angio-embolization pending, if unsuccessful will need emergent exploratory laparotomy.  OR on hold -NPO, await IR re-attempt by Dr. HBarbie Banner-We discussed with the patient and her husband about the gravity of this situation.  They understand that she is at very high risk given her cardiac status, current state, and co-morbidities.  She may not survive an operation or may have complications during or post-operatively.  Elevated Troponin's, Abnormal wall motion, Valvular disease H/o HTN, HLD, AFIB, hypothyroidism Sepsis/Enterococcus bacteremia/UTI - in blood and urine Recent fall - knee pain    [facility] 06/06/2015, 12:34 PM Pager: [phone](7am - 4:30pm M-F; 7am - 11:30am Sa/Su)

## 2015-06-06 NOTE — Addendum Note (Signed)
Addendum  created 06/06/15 1753 by Elisabeth CaraLacey A Deontrey Massi, CRNA   Modules edited: Charges VN

## 2015-06-06 NOTE — Progress Notes (Signed)
I have seen and examined Mrs. Lavine. I have reviewed her chart. She has a persistently bleeding GI and duodenal ulcer. She had embolization of the gastroduodenal artery yesterday and the bleeding was quiescent until earlier this morning. She now has fairly continuous bleeding. She is hypotensive and pale. She had a myocardial infarction 3 days ago and has significant wall akinesis as well as significant mitral valve regurgitation and an abnormal aortic valve.  Her risk of operative mortality is very high. Also, we may not be able to stop the bleeding if the inflammatory tissue around the ulcer won't hold any stitches. I spoke with Dr. Bonnielee HaffHoss in interventional radiology, and he is willing to try a repeat arteriogram to see if there is another vessel he can embolize as I believe this would be her best chance to survive this problem. If not, I spoke with her and her husband about emergency exploratory laparotomy and trying to oversew the ulcer. We went over the procedure and risks in detail including the very high risk of death either on the operating table her after the surgery. Other risks include but aren't limited to bleeding, infection, wound healing problems, anesthesia, damage abdominal organs, need for other surgeries. They both seem to understand the gravity of the situation and would like to proceed with the attempted angioembolization followed by the surgery if the angioembolization is not able to be done. Current INR is 1.5. She is getting fresh frozen plasma for this. She is hypotensive. She is currently also receiving a blood transfusion.

## 2015-06-06 NOTE — Progress Notes (Signed)
PULMONARY / CRITICAL CARE MEDICINE   Name: Jasmine Newman MRN: [id number] DOB: 02/04/1932    ADMISSION DATE:  06/01/2015 CONSULTATION DATE:  06/01/15  REFERRING MD:  ED  CHIEF COMPLAINT:  GI bleed, hypotension  HISTORY OF PRESENT ILLNESS:   82 year old with past mental history of hypothyroidism, hypertension, hyperlipidemia, depression. Admitted today to the ED after she had a fall at home. She is watching the [facility] basketball game yesterday with her husband. When she stood up to use the bathroom her legs gave out and she fell on the right side. She hurt her leg and chest. She did not hit her head and there was no loss of consciousness. She on the floor the whole night. When her son arrived next morning she was found to be lying in a large pool of melanotic stool. In the ED she was found to be hypertensive with hemoglobin of 7.4 (baseline unknown). She continued to be hypotensive in spite of 2 L of fluid. GI evaluated the patient. PCCM called for admission.  SUBJECTIVE: Patient states only 1 bowel movement this morning. Mild nausea and no emesis today. Minimal abdominal pain. No chest pain or pressure. States she is hungry.  REVIEW OF SYSTEMS:  No subjective fever, chills, or sweats. No dyspnea or cough.  VITAL SIGNS: BP 115/79 mmHg  Pulse 70  Temp(Src) 97.9 F (36.6 C) (Core (Comment))  Resp 15  Ht  (1.651 m)  Wt 219 lb 2.2 oz (99.4 kg)  BMI 36.47 kg/m2  SpO2 100%  LMP  (LMP Unknown)  HEMODYNAMICS:    VENTILATOR SETTINGS:    INTAKE / OUTPUT: I/O last 3 completed shifts: In: 5112 [I.V.:3872; Blood:890; IV Piggyback:350] Out: 3085 [Urine:2635; Stool:450]  PHYSICAL EXAMINATION: General:  Elderly female. Awake. Alert.Pale  Neuro: Following commands. Grossly nonfocal. No meningismus. HEENT: Moist. His membranes. No scleral injection.  Cardiovascular:  Sinus rhythm with regular rate. No appreciable JVD.  Lungs:  Clear bilaterally to auscultation. Normal work of  breathing on nasal cannula oxygen. Abdomen: Soft. Normal bowel sounds. Nontender. Integument:  Warm & dry. No rash on exposed skin/ boot to left foot.  LABS:  BMET  Recent Labs Lab 06/04/15 0420 06/05/15 0430 06/06/15 0200  NA 144 144 146*  K 3.6 3.8 3.8  CL 114* 114* 119*  CO2 BUN 22* 16 14  CREATININE 1.38* 1.36* 1.31*  GLUCOSE 92 98 95    Electrolytes  Recent Labs Lab 06/04/15 0400 06/04/15 0420 06/05/15 0430 06/06/15 0200  CALCIUM  --  7.8* 7.7* 7.4*  MG 1.8  --  2.1 1.9  PHOS  --  3.0 2.6 3.1    CBC  Recent Labs Lab 06/04/15 0400 06/05/15 0430  06/06/15 0033 06/06/15 0200 06/06/15 0828  WBC 9.9 7.5  --   --  10.8*  --   HGB 8.1*  8.3* 7.9*  < > 7.6* 7.9* 8.0*  HCT 24.3*  25.4* 24.6*  < > 22.1* 23.4* 23.9*  PLT 124* 119*  --   --  106*  --   < > = values in this interval not displayed.  Coag's  Recent Labs Lab 06/01/15 1125 06/03/15 1000 06/06/15 0330  APTT 27 33 33  INR 1.07 1.27 1.51*    Sepsis Markers  Recent Labs Lab 06/01/15 1529 06/01/15 2054 06/01/15 2100 06/02/15 0034  LATICACIDVEN 0.87 0.7  --  1.0  PROCALCITON  --   --  0.63  --     ABG  Recent  Labs Lab 06/02/15 1710 06/03/15 0401  PHART 7.348* 7.332*  PCO2ART 38.2 40.0  PO2ART 396* 82.4    Liver Enzymes  Recent Labs Lab 06/01/15 1125  06/04/15 0420 06/05/15 0430 06/06/15 0200  AST 25  --   --   --   --   ALT 19  --   --   --   --   ALKPHOS 64  --   --   --   --   BILITOT 0.5  --   --   --   --   ALBUMIN 2.4*  < > 2.0* 1.9* 1.6*  < > = values in this interval not displayed.  Cardiac Enzymes  Recent Labs Lab 06/02/15 1656 06/03/15 1800 06/04/15 0400  TROPONINI 3.27* 1.26* 1.27*    Glucose  Recent Labs Lab 06/04/15 1145 06/05/15 0813 06/05/15 1105 06/05/15 1628 06/05/15 1923 06/06/15 0354  GLUCAP 85 125* 105* 86 92 86    Imaging Ir Angiogram Visceral Selective  06/05/2015  INDICATION: Acute upper GI bleeding from a known  giant duodenal ulcer by upper endoscopy EXAM: SELECTIVE VISCERAL ARTERIOGRAPHY; IR ULTRASOUND GUIDANCE VASC ACCESS RIGHT; IR EMBO ART VEN HEMORR LYMPH EXTRAV INC GUIDE ROADMAPPING; ADDITIONAL ARTERIOGRAPHY MEDICATIONS: None. ANESTHESIA/SEDATION: 50 MCG FENTANYL ONLY Moderate Sedation Time: NONE. The patient's level of consciousness and vital signs were monitored continuously by radiology nursing throughout the procedure under my direct supervision. CONTRAST:  50mL ISOVUE-300 IOPAMIDOL (ISOVUE-300) INJECTION 61% FLUOROSCOPY TIME:  Fluoroscopy Time: 12 minutes 18 seconds (947 mGy). COMPLICATIONS: None immediate. PROCEDURE: Informed consent was obtained from the patient following explanation of the procedure, risks, benefits and alternatives. The patient understands, agrees and consents for the procedure. All questions were addressed. A time out was performed prior to the initiation of the procedure. Maximal barrier sterile technique utilized including caps, mask, sterile gowns, sterile gloves, large sterile drape, hand hygiene, and Betadine prep. Under sterile conditions and local anesthesia, ultrasound micropuncture access performed of the patent right common femoral artery. Five French sheath inserted over a guidewire. Five French C2 catheter advanced over a Bentson guidewire to the celiac artery. Selective celiac angiogram performed. Celiac: Celiac is widely patent. Left gastric, splenic and hepatic vasculature patent. GDA is patent. There is active extravasation from the gastroduodenal artery localized to the level of the duodenum compatible with a bleeding duodenal ulcer. Renegade STC micro catheter and GT micro Glidewire were advanced through the C2 catheter into the gastroduodenal artery. Selective gastroduodenal angiogram performed. Gastroduodenal artery: Gastroduodenal artery is patent. Active bleeding present from a small distal branch of the gastroduodenal artery compatible with an active bleeding duodenal  ulcer. Embolization: Micro catheter was advanced over the catheter and guidewire to the level of the gastroduodenal artery bleeding site. Repeat injection confirms micro catheter position at the site of active bleeding. Micro coil embolization performed of the gastroduodenal artery with insertion of 3 3 mm x 6 mm interlock coils, 1 4 mm x 8 mm soft coil, 1 4 mm x 12 mm soft coil, and 1 5 mm x 8 mm soft coil. Post embolization angiogram demonstrates occlusion of the gastroduodenal artery with no further active bleeding. Micro catheter was retracted into the common hepatic artery. Common hepatic artery angiogram performed. Common hepatic: This confirms preserved patency of the common, proper, left and right hepatic arteries. Access removed. Hemostasis obtained with manual compression. No immediate complication. Patient tolerated the procedure well. IMPRESSION: Active bleeding from a small branch peripherally of the gastroduodenal artery compatible with bleeding from a  known giant duodenal ulcer. Successful micro coil embolization of the gastroduodenal artery to complete occlusion with no further active bleeding. Electronically Signed   By: Judie PetitM.  Shick M.D.   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 ANESTHESIA/SEDATION: 50 MCG FENTANYL ONLY Moderate Sedation Time: NONE. The patient's level of consciousness and vital signs were monitored continuously by radiology nursing throughout the procedure under my direct supervision. CONTRAST:  50mL ISOVUE-300 IOPAMIDOL (ISOVUE-300) INJECTION 61% FLUOROSCOPY TIME:  Fluoroscopy Time: 12 minutes 18 seconds (947 mGy). COMPLICATIONS: None immediate. PROCEDURE: Informed  consent was obtained from the patient following explanation of the procedure, risks, benefits and alternatives. The patient understands, agrees and consents for the procedure. All questions were addressed. A time out was performed prior to the initiation of the procedure. Maximal barrier sterile technique utilized including caps, mask, sterile gowns, sterile gloves, large sterile drape, hand hygiene, and Betadine prep. Under sterile conditions and local anesthesia, ultrasound micropuncture access performed of the patent right common femoral artery. Five French sheath inserted over a guidewire. Five French C2 catheter advanced over a Bentson guidewire to the celiac artery. Selective celiac angiogram performed. Celiac: Celiac is widely patent. Left gastric, splenic and hepatic vasculature patent. GDA is patent. There is active extravasation from the gastroduodenal artery localized to the level of the duodenum compatible with a bleeding duodenal ulcer. Renegade STC micro catheter and GT micro Glidewire were advanced through the C2 catheter into the gastroduodenal artery. Selective gastroduodenal angiogram performed. Gastroduodenal artery: Gastroduodenal artery is patent. Active bleeding present from a small distal branch of the gastroduodenal artery compatible with an active bleeding duodenal ulcer. Embolization: Micro catheter was advanced over the catheter and guidewire to the level of the gastroduodenal artery bleeding site. Repeat injection confirms micro catheter position at the site of active bleeding. Micro coil embolization performed of the gastroduodenal artery with insertion of 3 3 mm x 6 mm interlock coils, 1 4 mm x 8 mm soft coil, 1 4 mm x 12 mm soft coil, and 1 5 mm x 8 mm soft coil. Post embolization angiogram demonstrates occlusion of the gastroduodenal artery with no further active bleeding. Micro catheter was retracted into the common hepatic artery. Common hepatic artery angiogram performed. Common  hepatic: This confirms preserved patency of the common, proper, left and right hepatic arteries. Access removed. Hemostasis obtained with manual compression. No immediate complication. Patient tolerated the procedure well. 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 Successful micro coil embolization of the gastroduodenal artery to complete occlusion with no further active bleeding. Electronically Signed   By: Judie PetitM.  Shick M.D.   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 ANESTHESIA/SEDATION: 50 MCG FENTANYL ONLY Moderate Sedation Time: NONE. The patient's level of consciousness and vital signs were monitored continuously by radiology nursing throughout the procedure under my direct supervision. CONTRAST:  50mL ISOVUE-300 IOPAMIDOL (ISOVUE-300) INJECTION 61% FLUOROSCOPY TIME:  Fluoroscopy Time: 12  minutes 18 seconds (947 mGy). COMPLICATIONS: None immediate. PROCEDURE: Informed consent was obtained from the patient following explanation of the procedure, risks, benefits and alternatives. The patient understands, agrees and consents for the procedure. All questions were addressed. A time out was performed prior to the initiation of the procedure. Maximal barrier sterile technique utilized including caps, mask, sterile gowns, sterile gloves, large sterile drape, hand hygiene, and Betadine prep. Under sterile conditions and local anesthesia, ultrasound micropuncture access performed of the patent right common femoral artery. Five French sheath inserted over a guidewire. Five French C2 catheter advanced over a  Bentson guidewire to the celiac artery. Selective celiac angiogram performed. Celiac: Celiac is widely patent. Left gastric, splenic and hepatic vasculature patent. GDA is patent. There is active extravasation from the gastroduodenal artery localized to the level of the duodenum compatible with a bleeding duodenal ulcer. Renegade STC micro catheter and GT micro Glidewire were advanced through the C2 catheter into the gastroduodenal artery. Selective gastroduodenal angiogram performed. Gastroduodenal artery: Gastroduodenal artery is patent. Active bleeding present from a small distal branch of the gastroduodenal artery compatible with an active bleeding duodenal ulcer. Embolization: Micro catheter was advanced over the catheter and guidewire to the level of the gastroduodenal artery bleeding site. Repeat injection confirms micro catheter position at the site of active bleeding. Micro coil embolization performed of the gastroduodenal artery with insertion of 3 3 mm x 6 mm interlock coils, 1 4 mm x 8 mm soft coil, 1 4 mm x 12 mm soft coil, and 1 5 mm x 8 mm soft coil. Post embolization angiogram demonstrates occlusion of the gastroduodenal artery with no further active bleeding. Micro catheter was retracted into the common hepatic artery. Common hepatic artery angiogram performed. Common hepatic: This confirms preserved patency of the common, proper, left and right hepatic arteries. Access removed. Hemostasis obtained with manual compression. No immediate complication. Patient tolerated the procedure well. 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 Successful micro coil embolization of the gastroduodenal artery to complete occlusion with no further active bleeding. Electronically Signed   By: Judie Petit.  Shick M.D.   On: 06/05/2015 13:27   Ir US Guide Vasc Access Right  06/05/2015  INDICATION: Acute upper GI bleeding  from a known giant duodenal ulcer by upper endoscopy EXAM: SELECTIVE VISCERAL ARTERIOGRAPHY; IR ULTRASOUND GUIDANCE VASC ACCESS RIGHT; IR EMBO ART VEN HEMORR LYMPH EXTRAV INC GUIDE ROADMAPPING; ADDITIONAL ARTERIOGRAPHY MEDICATIONS: None. ANESTHESIA/SEDATION: 50 MCG FENTANYL ONLY Moderate Sedation Time: NONE. The patient's level of consciousness and vital signs were monitored continuously by radiology nursing throughout the procedure under my direct supervision. CONTRAST:  50mL ISOVUE-300 IOPAMIDOL (ISOVUE-300) INJECTION 61% FLUOROSCOPY TIME:  Fluoroscopy Time: 12 minutes 18 seconds (947 mGy). COMPLICATIONS: None immediate. PROCEDURE: Informed consent was obtained from the patient following explanation of the procedure, risks, benefits and alternatives. The patient understands, agrees and consents for the procedure. All questions were addressed. A time out was performed prior to the initiation of the procedure. Maximal barrier sterile technique utilized including caps, mask, sterile gowns, sterile gloves, large sterile drape, hand hygiene, and Betadine prep. Under sterile conditions and local anesthesia, ultrasound micropuncture access performed of the patent right common femoral artery. Five French sheath inserted over a guidewire. Five French C2 catheter advanced over a Bentson guidewire to the celiac artery. Selective celiac angiogram performed. Celiac: Celiac is widely patent. Left gastric, splenic and hepatic vasculature patent. GDA is patent. There  is active extravasation from the gastroduodenal artery localized to the level of the duodenum compatible with a bleeding duodenal ulcer. Renegade STC micro catheter and GT micro Glidewire were advanced through the C2 catheter into the gastroduodenal artery. Selective gastroduodenal angiogram performed. Gastroduodenal artery: Gastroduodenal artery is patent. Active bleeding present from a small distal branch of the gastroduodenal artery compatible with an active  bleeding duodenal ulcer. Embolization: Micro catheter was advanced over the catheter and guidewire to the level of the gastroduodenal artery bleeding site. Repeat injection confirms micro catheter position at the site of active bleeding. Micro coil embolization performed of the gastroduodenal artery with insertion of 3 3 mm x 6 mm interlock coils, 1 4 mm x 8 mm soft coil, 1 4 mm x 12 mm soft coil, and 1 5 mm x 8 mm soft coil. Post embolization angiogram demonstrates occlusion of the gastroduodenal artery with no further active bleeding. Micro catheter was retracted into the common hepatic artery. Common hepatic artery angiogram performed. Common hepatic: This confirms preserved patency of the common, proper, left and right hepatic arteries. Access removed. Hemostasis obtained with manual compression. No immediate complication. Patient tolerated the procedure well. 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 Successful micro coil embolization of the gastroduodenal artery to complete occlusion with no further active bleeding. Electronically Signed   By: Judie Petit.  Shick M.D.   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 ANESTHESIA/SEDATION: 50 MCG FENTANYL ONLY Moderate Sedation Time: NONE. The patient's level of consciousness and vital signs were monitored continuously by radiology nursing throughout the procedure under my direct supervision. CONTRAST:  50mL ISOVUE-300 IOPAMIDOL (ISOVUE-300) INJECTION 61% FLUOROSCOPY TIME:  Fluoroscopy Time: 12 minutes 18 seconds (947 mGy). COMPLICATIONS: None  immediate. PROCEDURE: Informed consent was obtained from the patient following explanation of the procedure, risks, benefits and alternatives. The patient understands, agrees and consents for the procedure. All questions were addressed. A time out was performed prior to the initiation of the procedure. Maximal barrier sterile technique utilized including caps, mask, sterile gowns, sterile gloves, large sterile drape, hand hygiene, and Betadine prep. Under sterile conditions and local anesthesia, ultrasound micropuncture access performed of the patent right common femoral artery. Five French sheath inserted over a guidewire. Five French C2 catheter advanced over a Bentson guidewire to the celiac artery. Selective celiac angiogram performed. Celiac: Celiac is widely patent. Left gastric, splenic and hepatic vasculature patent. GDA is patent. There is active extravasation from the gastroduodenal artery localized to the level of the duodenum compatible with a bleeding duodenal ulcer. Renegade STC micro catheter and GT micro Glidewire were advanced through the C2 catheter into the gastroduodenal artery. Selective gastroduodenal angiogram performed. Gastroduodenal artery: Gastroduodenal artery is patent. Active bleeding present from a small distal branch of the gastroduodenal artery compatible with an active bleeding duodenal ulcer. Embolization: Micro catheter was advanced over the catheter and guidewire to the level of the gastroduodenal artery bleeding site. Repeat injection confirms micro catheter position at the site of active bleeding. Micro coil embolization performed of the gastroduodenal artery with insertion of 3 3 mm x 6 mm interlock coils, 1 4 mm x  8 mm soft coil, 1 4 mm x 12 mm soft coil, and 1 5 mm x 8 mm soft coil. Post embolization angiogram demonstrates occlusion of the gastroduodenal artery with no further active bleeding. Micro catheter was retracted into the common hepatic artery. Common hepatic artery  angiogram performed. Common hepatic: This confirms preserved patency of the common, proper, left and right hepatic arteries. Access removed. Hemostasis obtained with manual compression. No immediate complication. Patient tolerated the procedure well. 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 Successful micro coil embolization of the gastroduodenal artery to complete occlusion with no further active bleeding. Electronically Signed   By: Judie Petit.  Shick M.D.   On: 06/05/2015 13:27    STUDIES:  X ray rib 4/2: No fractures Port CXR 4/2:  Questionable right basal hazy opacity. R IJ CVL in mid-SVC. Chronic elevation left hemidiaphragm.  R Knee 2View 4/2: No fracture or dislocation. Port CXR 4/4:  Silhouetting of left hemidiaphragm unchanged. Endotracheal tube and central venous catheter in good position. No new focal opacity. TTE 4/4: LV normal in size with EF 45-50%. Akinesis of anterior septal myocardium. Grade 2 diastolic dysfunction. LA & RA normal in size. RV normal in size and function. PASP . Moderate aortic stenosis without regurgitation. Severe mitral vegetation without stenosis. Trivial pulmonic regurgitation without stenosis. Moderate tricuspid regurgitation.  CULTURES: Blood Ctx x2 4/2>>> Enterococcus 1/2 bottles Urine Ctx x2 4/2>>> Enterococcus 2/2 cultures MRSA PCR 4/2:  Negative   ANTIBIOTICS: Ampicillin 4/5>>> Vancomycin 4/3 - 4/5 Rocephin 4/3 - 4/5 Unasyn 4/2  SIGNIFICANT EVENTS: 4/2 - Admit 4/3 - Intubation & EGD 4/4 - Extubation 4/6- Successful GDA microcoil embo per IR  LINES/TUBES: R IJ CVL 4/2>> Foley 4/2>> PIV x1 OETT 4/3 - 4/4  ASSESSMENT / PLAN:  PULMONARY A: Possible RLL Pneumonia Acute Hypoxic Respiratory Failure - Likely secondary to anemia & IV blood/fluid resuscitation. Intubated for EGD 4/3 then extubated 4/4.  P:   Continuous pulse ox Wean for  FiO2 >94% CXR 06/07/15  CARDIOVASCULAR A:  Shock - Intermittent. Sepsis vs GIB. Moderate Aortic Stenosis / Severe Mitral Regurg - Seen on TTE 4/4. Demand Ischemia 2/2 anemia NSTEMI - Possible MI given wall motion abnormality on TTE 4/4. Atrial Fibrillation - Intermittent. H/O HTN H/O Hyperlipdemia H/O Bioprosthetic Valve - Porcine? Position?  P:  Continue Weaning Neosynephrine to maintain MAP>65 No systemic anticoagulation given GIB Monitor on telemetry Vitals per unit protocol Holding Lisinopril & Lopressor Zocor qhs Plan for Cardiology Consult once patient stabilizes EKG 4/8 RENAL A:   Acute Renal Failure - Improving. Hypomagnesemia - Replacing/ Resolved.  Hypokalemia - Replacing/ Resolved Hypernatremia / ? 2/2 transfusions P:    Trending UOP with Foley Monitoring daily renal function & electrolytes Replacing electrolytes as indicated   GASTROINTESTINAL A:   Acute GIB - Secondary to Duodenal Ulcer on EGD 4/3.  P:   Embolized by IR 06/05/15 GI Following/ Rescope ? Continue Protonix gtt NPO Initiate diet per GI  HEMATOLOGIC A:   Anemia - Secondary to GIB. S/P 7u PRBC.  Thrombocytopenia - Mild. Likely consumption.  Increasing PT/INR P:  Transfused 3u PRBC total Consider tranfusing FFP Trending Hgb/Hct q 6  Trend Platelets daily Transfuse for Hgb <8.0 or active bleeding( +MI) Trending remaining cell counts daily w/ CBC SCDs/ no anticoagulation  INFECTIOUS A:   Sepsis - Secondary to UTI & Bacteremia Enterococcus Bacteremia Enterococcus UTI  P:   ID Following &  appreciate recommendations Repeat Cultures pending Ampicillin Day #6  Procalcitonin per algorithm Consider culturing L heel ulcer if drainage.  ENDOCRINE A:   Questionable Adrenal Insufficiency H/O Hypothyroidism  P:   Synthroid IV daily Accu-Checks q4hr SSI per algorithm Holding on steroid therapy at this time given duodenal ulcer  NEUROLOGIC A:   Pain in Right Knee - No fracture  of X-ray. H/O Depression  P:   Goal RASS:  0 Holding Wellbutrin XL & Cymbalta Holding pain medication   FAMILY  - No family at bedside - Inter-disciplinary family meet or Palliative Care meeting due by:  4/9  TODAY'S SUMMARY:  An 82 year old presenting with fall, ongoing hypotension ( pressor dependent), with continued bright red stools post GDA microcoil/ embo on 4/6 for duodenal ulcer on EGD.  Transfused 3 total units packed red blood cells. Switching back to Protonix drip. NPO 2/2 continued bleeding.Bevelyn Ngo, AGACNP-BC [facility] Pulmonary/Critical Care Medicine Pager # [phone]  06/06/2015

## 2015-06-06 NOTE — Progress Notes (Signed)
RT attempted to collect ABG sample from A-line and was unsuccessful.  Pt Art line wasn't functioning properly and was not  displaying a waveform.  RT removed Art line due to being out of place.  RN  aware

## 2015-06-06 NOTE — Progress Notes (Signed)
Patient ID: Jasmine Newman, female   DOB: [birth year], 82 y.o.   MRN: [id number]    Referring Physician(s): Rosenbower,Todd/Hayes,John  Supervising Physician: Jolaine Click  Chief Complaint:  UGI bleed, duodenal bulb ulcer  Subjective: Pt continues to have several large bloody BM's today per nursing ; s/p coil embolization of  GDA yesterday; still appears weak, pale with occ nausea   Allergies: Review of patient's allergies indicates no known allergies.  Medications: Prior to Admission medications   Medication Sig Start Date End Date Taking? Authorizing Provider  buPROPion (WELLBUTRIN XL) 150 MG 24 hr tablet Take 1 tablet (150 mg total) by mouth daily. Patient not taking: Reported on 06/01/2015 06/28/11   Dois Davenport, MD  DULoxetine (CYMBALTA) 60 MG capsule Take 1 capsule (60 mg total) by mouth daily. Patient not taking: Reported on 06/01/2015 06/28/11   Dois Davenport, MD  levothyroxine (SYNTHROID, LEVOTHROID) 50 MCG tablet Take 1 tablet (50 mcg total) by mouth daily. Patient not taking: Reported on 06/01/2015 06/28/11   Dois Davenport, MD  lisinopril (PRINIVIL,ZESTRIL) 5 MG tablet Take 1 tablet (5 mg total) by mouth daily. Patient not taking: Reported on 06/01/2015 06/28/11   Dois Davenport, MD  metoprolol (LOPRESSOR) 50 MG tablet Take 1 tablet (50 mg total) by mouth 2 (two) times daily. Patient not taking: Reported on 06/01/2015 06/28/11   Dois Davenport, MD  simvastatin (ZOCOR) 40 MG tablet Take 1 tablet (40 mg total) by mouth every evening. 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 ANESTHESIA/SEDATION: 50 MCG FENTANYL ONLY Moderate Sedation Time: NONE. The patient's level of consciousness and vital signs were monitored continuously by radiology nursing throughout the procedure under my direct supervision. CONTRAST:  50mL ISOVUE-300 IOPAMIDOL (ISOVUE-300) INJECTION 61% FLUOROSCOPY TIME:  Fluoroscopy Time: 12 minutes 18 seconds (947 mGy). COMPLICATIONS: None immediate. PROCEDURE: Informed consent was obtained from the patient following explanation of the procedure, risks, benefits and alternatives. The patient understands, agrees and consents for the procedure. All questions were addressed. A time out was performed prior to the initiation of the procedure. Maximal barrier sterile technique utilized including caps, mask, sterile gowns, sterile gloves, large sterile drape, hand hygiene, and Betadine prep. Under sterile conditions and local anesthesia, ultrasound micropuncture access performed of the patent right common femoral artery. Five French sheath inserted over a guidewire. Five French C2 catheter advanced over a Bentson guidewire to the celiac artery. Selective celiac angiogram performed. Celiac: Celiac is widely patent. Left gastric, splenic and hepatic vasculature patent. GDA is patent. There is active extravasation from the gastroduodenal artery localized to the level of the duodenum compatible with a bleeding duodenal ulcer. Renegade STC micro catheter and GT micro Glidewire were advanced through the  C2 catheter into the gastroduodenal artery. Selective gastroduodenal angiogram performed. Gastroduodenal artery: Gastroduodenal artery is  patent. Active bleeding present from a small distal branch of the gastroduodenal artery compatible with an active bleeding duodenal ulcer. Embolization: Micro catheter was advanced over the catheter and guidewire to the level of the gastroduodenal artery bleeding site. Repeat injection confirms micro catheter position at the site of active bleeding. Micro coil embolization performed of the gastroduodenal artery with insertion of 3 3 mm x 6 mm interlock coils, 1 4 mm x 8 mm soft coil, 1 4 mm x 12 mm soft coil, and 1 5 mm x 8 mm soft coil. Post embolization angiogram demonstrates occlusion of the gastroduodenal artery with no further active bleeding. Micro catheter was retracted into the common hepatic artery. Common hepatic artery angiogram performed. Common hepatic: This confirms preserved patency of the common, proper, left and right hepatic arteries. Access removed. Hemostasis obtained with manual compression. No immediate complication. Patient tolerated the procedure well. 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 Successful micro coil embolization of the gastroduodenal artery to complete occlusion with no further active bleeding. Electronically Signed   By: Judie Petit.  Shick M.D.   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 ANESTHESIA/SEDATION: 50 MCG FENTANYL ONLY Moderate Sedation Time: NONE. The patient's level of consciousness and vital signs were monitored continuously by radiology nursing throughout the procedure under my direct supervision. CONTRAST:  50mL ISOVUE-300 IOPAMIDOL (ISOVUE-300)  INJECTION 61% FLUOROSCOPY TIME:  Fluoroscopy Time: 12 minutes 18 seconds (947 mGy). COMPLICATIONS: None immediate. PROCEDURE: Informed consent was obtained from the patient following explanation of the procedure, risks, benefits and alternatives. The patient understands, agrees and consents for the procedure. All questions were addressed. A time out was performed prior to the initiation of the procedure. Maximal barrier sterile technique utilized including caps, mask, sterile gowns, sterile gloves, large sterile drape, hand hygiene, and Betadine prep. Under sterile conditions and local anesthesia, ultrasound micropuncture access performed of the patent right common femoral artery. Five French sheath inserted over a guidewire. Five French C2 catheter advanced over a Bentson guidewire to the celiac artery. Selective celiac angiogram performed. Celiac: Celiac is widely patent. Left gastric, splenic and hepatic vasculature patent. GDA is patent. There is active extravasation from the gastroduodenal artery localized to the level of the duodenum compatible with a bleeding duodenal ulcer. Renegade STC micro catheter and GT micro Glidewire were advanced through the C2 catheter into the gastroduodenal artery. Selective gastroduodenal angiogram performed. Gastroduodenal artery: Gastroduodenal artery is patent. Active bleeding present from a small distal branch of the gastroduodenal artery compatible with an active bleeding duodenal ulcer. Embolization: Micro catheter was advanced over the catheter and guidewire to the level of the gastroduodenal artery bleeding site. Repeat injection confirms micro catheter position at the site of active bleeding. Micro coil embolization performed of the gastroduodenal artery with insertion of 3 3 mm x 6 mm interlock coils, 1 4 mm x 8 mm soft coil, 1 4 mm x 12 mm soft coil, and 1 5 mm x 8 mm soft coil. Post embolization angiogram demonstrates occlusion of the gastroduodenal artery with no  further active bleeding. Micro catheter  was retracted into the common hepatic artery. Common hepatic artery angiogram performed. Common hepatic: This confirms preserved patency of the common, proper, left and right hepatic arteries. Access removed. Hemostasis obtained with manual compression. No immediate complication. Patient tolerated the procedure well. 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 Successful micro coil embolization of the gastroduodenal artery to complete occlusion with no further active bleeding. Electronically Signed   By: Judie PetitM.  Shick M.D.   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 ANESTHESIA/SEDATION: 50 MCG FENTANYL ONLY Moderate Sedation Time: NONE. The patient's level of consciousness and vital signs were monitored continuously by radiology nursing throughout the procedure under my direct supervision. CONTRAST:  50mL ISOVUE-300 IOPAMIDOL (ISOVUE-300) INJECTION 61% FLUOROSCOPY TIME:  Fluoroscopy Time: 12 minutes 18 seconds (947 mGy). COMPLICATIONS: None immediate. PROCEDURE: Informed consent was obtained from the patient following explanation of the procedure, risks, benefits and alternatives. The patient understands, agrees and consents for the procedure. All questions were addressed. A time out was performed prior to the initiation of the procedure. Maximal barrier sterile technique utilized including caps, mask, sterile gowns, sterile gloves, large sterile drape, hand hygiene, and Betadine prep. Under sterile conditions and local anesthesia, ultrasound micropuncture access  performed of the patent right common femoral artery. Five French sheath inserted over a guidewire. Five French C2 catheter advanced over a Bentson guidewire to the celiac artery. Selective celiac angiogram performed. Celiac: Celiac is widely patent. Left gastric, splenic and hepatic vasculature patent. GDA is patent. There is active extravasation from the gastroduodenal artery localized to the level of the duodenum compatible with a bleeding duodenal ulcer. Renegade STC micro catheter and GT micro Glidewire were advanced through the C2 catheter into the gastroduodenal artery. Selective gastroduodenal angiogram performed. Gastroduodenal artery: Gastroduodenal artery is patent. Active bleeding present from a small distal branch of the gastroduodenal artery compatible with an active bleeding duodenal ulcer. Embolization: Micro catheter was advanced over the catheter and guidewire to the level of the gastroduodenal artery bleeding site. Repeat injection confirms micro catheter position at the site of active bleeding. Micro coil embolization performed of the gastroduodenal artery with insertion of 3 3 mm x 6 mm interlock coils, 1 4 mm x 8 mm soft coil, 1 4 mm x 12 mm soft coil, and 1 5 mm x 8 mm soft coil. Post embolization angiogram demonstrates occlusion of the gastroduodenal artery with no further active bleeding. Micro catheter was retracted into the common hepatic artery. Common hepatic artery angiogram performed. Common hepatic: This confirms preserved patency of the common, proper, left and right hepatic arteries. Access removed. Hemostasis obtained with manual compression. No immediate complication. Patient tolerated the procedure well. 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 Successful micro coil embolization of the gastroduodenal artery to complete occlusion with no further active bleeding.  Electronically Signed   By: Judie PetitM.  Shick M.D.   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 ANESTHESIA/SEDATION: 50 MCG FENTANYL ONLY Moderate Sedation Time: NONE. The patient's level of consciousness and vital signs were monitored continuously by radiology nursing throughout the procedure under my direct supervision. CONTRAST:  50mL ISOVUE-300 IOPAMIDOL (ISOVUE-300) INJECTION 61% FLUOROSCOPY TIME:  Fluoroscopy Time: 12 minutes 18 seconds (947 mGy). COMPLICATIONS: None immediate. PROCEDURE: Informed consent was obtained from the patient following explanation of the procedure, risks, benefits and alternatives. The patient understands, agrees and consents for the procedure. All questions were addressed. A time out was performed prior to the initiation of the procedure. Maximal barrier sterile technique utilized including caps, mask, sterile gowns, sterile gloves, large sterile drape, hand hygiene, and Betadine prep. Under sterile conditions and local anesthesia, ultrasound micropuncture access performed of the patent right common femoral artery. Five French sheath inserted over a guidewire. Five French C2 catheter advanced over a Bentson guidewire to the celiac artery. Selective celiac angiogram performed. Celiac: Celiac is widely patent. Left gastric, splenic and hepatic vasculature patent. GDA is patent. There is active extravasation from the gastroduodenal artery localized to the level of the duodenum compatible with a bleeding duodenal ulcer. Renegade STC micro catheter and GT micro Glidewire were advanced through the C2 catheter into the gastroduodenal artery. Selective gastroduodenal angiogram performed. Gastroduodenal  artery: Gastroduodenal artery is patent. Active bleeding present from a small distal branch of the gastroduodenal artery compatible with an active bleeding duodenal ulcer. Embolization: Micro catheter was advanced over the catheter and guidewire to the level of the gastroduodenal artery bleeding site. Repeat injection confirms micro catheter position at the site of active bleeding. Micro coil embolization performed of the gastroduodenal artery with insertion of 3 3 mm x 6 mm interlock coils, 1 4 mm x 8 mm soft coil, 1 4 mm x 12 mm soft coil, and 1 5 mm x 8 mm soft coil. Post embolization angiogram demonstrates occlusion of the gastroduodenal artery with no further active bleeding. Micro catheter was retracted into the common hepatic artery. Common hepatic artery angiogram performed. Common hepatic: This confirms preserved patency of the common, proper, left and right hepatic arteries. Access removed. Hemostasis obtained with manual compression. No immediate complication. Patient tolerated the procedure well. 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 Successful micro coil embolization of the gastroduodenal artery to complete occlusion with no further active bleeding. Electronically Signed   By: Judie Petit.  Shick M.D.   On: 06/05/2015 13:27   Dg Chest Port 1 View  06/03/2015  CLINICAL DATA:  Intubation. EXAM: EXAM PORTABLE CHEST 1 VIEW COMPARISON:  06/02/2015. FINDINGS: Endotracheal tube is been partially withdrawn, its tip is 3.4 cm above the carina. Right IJ line stable position. Mediastinum and hilar structures normal. Prior median sternotomy. Heart size normal. Low lung volumes with mild bibasilar atelectasis and/or infiltrates. Tiny left pleural effusion cannot be excluded. No pneumothorax . IMPRESSION: 1. Endotracheal tube is been withdrawn in good anatomic position. Its tip is 3.4 cm above carina. Right IJ line  stable position. 2. Stable mild bibasilar atelectasis and/or infiltrates. Tiny left pleural effusion cannot be excluded. Electronically Signed   By: Maisie Fus  Register   On: 06/03/2015 07:16   Dg Chest Port 1 View  06/02/2015  CLINICAL DATA:  Respiratory failure and endotracheal tube placement. EXAM: PORTABLE CHEST 1 VIEW COMPARISON:  06/01/2015 FINDINGS: The heart size and mediastinal contours are within normal limits.  An endotracheal tube is been placed. The tip lies in the right mainstem bronchus and the tube should be pulled back at least 3-4 cm. Lungs show bibasilar atelectasis and potentially a component of bibasilar infiltrates. No edema, pneumothorax or significant pleural fluid identified. Stable appearance of right jugular central line with catheter tip in the SVC. The visualized skeletal structures are unremarkable. IMPRESSION: Endotracheal tube tip lies in the right mainstem bronchus and the tube should be pull-back roughly 3-4 cm. These results will be called to the ordering clinician or representative by the Radiologist Assistant, and communication documented in the PACS or zVision Dashboard. Electronically Signed   By: Irish Lack M.D.   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 ANESTHESIA/SEDATION: 50 MCG FENTANYL ONLY Moderate Sedation Time: NONE. The patient's level of consciousness and vital signs were monitored continuously by radiology nursing throughout the procedure under my direct supervision. CONTRAST:  50mL ISOVUE-300 IOPAMIDOL (ISOVUE-300) INJECTION 61% FLUOROSCOPY TIME:  Fluoroscopy Time: 12 minutes 18 seconds (947 mGy). COMPLICATIONS: None immediate.  PROCEDURE: Informed consent was obtained from the patient following explanation of the procedure, risks, benefits and alternatives. The patient understands, agrees and consents for the procedure. All questions were addressed. A time out was performed prior to the initiation of the procedure. Maximal barrier sterile technique utilized including caps, mask, sterile gowns, sterile gloves, large sterile drape, hand hygiene, and Betadine prep. Under sterile conditions and local anesthesia, ultrasound micropuncture access performed of the patent right common femoral artery. Five French sheath inserted over a guidewire. Five French C2 catheter advanced over a Bentson guidewire to the celiac artery. Selective celiac angiogram performed. Celiac: Celiac is widely patent. Left gastric, splenic and hepatic vasculature patent. GDA is patent. There is active extravasation from the gastroduodenal artery localized to the level of the duodenum compatible with a bleeding duodenal ulcer. Renegade STC micro catheter and GT micro Glidewire were advanced through the C2 catheter into the gastroduodenal artery. Selective gastroduodenal angiogram performed. Gastroduodenal artery: Gastroduodenal artery is patent. Active bleeding present from a small distal branch of the gastroduodenal artery compatible with an active bleeding duodenal ulcer. Embolization: Micro catheter was advanced over the catheter and guidewire to the level of the gastroduodenal artery bleeding site. Repeat injection confirms micro catheter position at the site of active bleeding. Micro coil embolization performed of the gastroduodenal artery with insertion of 3 3 mm x 6 mm interlock coils, 1 4 mm x 8 mm soft coil, 1 4 mm x 12 mm soft coil, and 1 5 mm x 8 mm soft coil. Post embolization angiogram demonstrates occlusion of the gastroduodenal artery with no further active bleeding. Micro catheter was retracted into the common hepatic artery. Common hepatic artery angiogram  performed. Common hepatic: This confirms preserved patency of the common, proper, left and right hepatic arteries. Access removed. Hemostasis obtained with manual compression. No immediate complication. Patient tolerated the procedure well. 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 Successful micro coil embolization of the gastroduodenal artery to complete occlusion with no further active bleeding. Electronically Signed   By: Judie Petit.  Shick M.D.   On: 06/05/2015 13:27  Labs:  CBC:  Recent Labs  06/03/15 0255  06/04/15 0400 06/05/15 0430  06/05/15 2023 06/06/15 0033 06/06/15 0200 06/06/15 0828  WBC 10.1  --  9.9 7.5  --   --   --  10.8*  --   HGB 9.0*  < > 8.1*  8.3* 7.9*  < > 6.8* 7.6* 7.9* 8.0*  HCT 27.3*  < > 24.3*  25.4* 24.6*  < > 20.2* 22.1* 23.4* 23.9*  PLT 122*  --  124* 119*  --   --   --  106*  --   < > = values in this interval not displayed.  COAGS:  Recent Labs  06/01/15 1125 06/03/15 1000 06/06/15 0330  INR 1.07 1.27 1.51*  APTT 27 33 33    BMP:  Recent Labs  06/03/15 0255 06/04/15 0420 06/05/15 0430 06/06/15 0200  NA 142 144 144 146*  K 4.1 3.6 3.8 3.8  CL 114* 114* 114* 119*  CO2 23 24 25 23   GLUCOSE 118* 92 98 95  BUN 34* 22* 16 14  CALCIUM 7.5* 7.8* 7.7* 7.4*  CREATININE 1.56* 1.38* 1.36* 1.31*  GFRNONAA 30* 35* 35* 37*  GFRAA 35* 40* 41* 43*    LIVER FUNCTION TESTS:  Recent Labs  06/01/15 1125 06/03/15 0255 06/04/15 0420 06/05/15 0430 06/06/15 0200  BILITOT 0.5  --   --   --   --   AST 25  --   --   --   --   ALT 19  --   --   --   --   ALKPHOS 64  --   --   --   --   PROT 6.1*  --   --   --   --   ALBUMIN 2.4* 1.9* 2.0* 1.9* 1.6*    Assessment and Plan: Pt with UGI bleed secondary to large duodenal bulb ulcer, s/p coil embolization of GDA 4/6;bleeding persists today; receiving transfusions; hgb 8.0, PT 17.7/ INR 1.51,  creat 1.31, plts 106k. Request received for repeat mesenteric/visceral angio with possible embolization today. Risks and benefits discussed with the patient /husband including, but not limited to bleeding, infection, vascular injury or contrast induced renal failure and death. All of the patient's questions were answered, patient is agreeable to proceed.Consent signed and in chart. Case reviewed by Dr. Bonnielee Haff and d/w Dr. Abbey Chatters. Procedure to be done ASAP.     Electronically Signed: D. Jeananne Rama 06/06/2015, 12:46 PM   I spent a total of 20 minutes at the the patient's bedside AND on the patient's hospital floor or unit, greater than 50% of which was counseling/coordinating care for mesenteric/visceral arteriogram with possible embolization

## 2015-06-06 NOTE — Anesthesia Procedure Notes (Addendum)
Procedure Name: Intubation Date/Time: 06/06/2015 3:26 PM Performed by: Jarvis NewcomerARMISTEAD, Rylinn Linzy A Pre-anesthesia Checklist: Patient identified, Emergency Drugs available, Suction available, Patient being monitored and Timeout performed Patient Re-evaluated:Patient Re-evaluated prior to inductionOxygen Delivery Method: Circle system utilized Preoxygenation: Pre-oxygenation with 100% oxygen Intubation Type: IV induction, Cricoid Pressure applied and Rapid sequence Laryngoscope Size: Mac and 4 Grade View: Grade I Tube type: Subglottic suction tube Tube size: 7.5 mm Number of attempts: 1 Airway Equipment and Method: Stylet Placement Confirmation: ETT inserted through vocal cords under direct vision,  positive ETCO2 and breath sounds checked- equal and bilateral Secured at: 21 cm Tube secured with: Tape Dental Injury: Teeth and Oropharynx as per pre-operative assessment  Comments: RSI with cricoid pressure by Dr. Malen GauzeFoster   Performed by: Jarvis NewcomerARMISTEAD, Yannet Rincon A

## 2015-06-06 NOTE — Progress Notes (Signed)
[facility] Progress Note  Subjective: Feels much better this AM but had one large bloody BM just now, 1st since IR procedure  Objective: Vital signs in last 24 hours: Temp:  [96.6 F (35.9 C)-98.1 F (36.7 C)] 97.5 F (36.4 C) (04/07 0830) Pulse Rate:  [48-78] 67 (04/07 0830) Resp:  [10-26] 16 (04/07 0830) BP: (70-138)/(29-90) 118/57 mmHg (04/07 0830) SpO2:  [94 %-100 %] 100 % (04/07 0830) Weight:  [99.4 kg (219 lb 2.2 oz)] 99.4 kg (219 lb 2.2 oz) (04/07 0352) Weight change: 0.6 kg (1 lb 5.2 oz)   ZH:YQMVH, oriented, VSS  Lab Results: Results for orders placed or performed during the hospital encounter of 06/01/15 (from the past 24 hour(s))  Glucose, capillary     Status: Abnormal   Collection Time: 06/05/15 11:05 AM  Result Value Ref Range   Glucose-Capillary 105 (H) 65 - 99 mg/dL   Comment 1 Notify RN    Comment 2 Document in Chart   Hemoglobin and hematocrit, blood     Status: Abnormal   Collection Time: 06/05/15  1:41 PM  Result Value Ref Range   Hemoglobin 7.5 (L) 12.0 - 15.0 g/dL   HCT 84.6 (L) 96.2 - 95.2 %  Glucose, capillary     Status: None   Collection Time: 06/05/15  4:28 PM  Result Value Ref Range   Glucose-Capillary 86 65 - 99 mg/dL   Comment 1 Notify RN    Comment 2 Document in Chart   Glucose, capillary     Status: None   Collection Time: 06/05/15  7:23 PM  Result Value Ref Range   Glucose-Capillary 92 65 - 99 mg/dL   Comment 1 Notify RN    Comment 2 Document in Chart   Prepare RBC     Status: None   Collection Time: 06/05/15  8:00 PM  Result Value Ref Range   Order Confirmation ORDER PROCESSED BY BLOOD BANK   Hemoglobin and hematocrit, blood     Status: Abnormal   Collection Time: 06/05/15  8:23 PM  Result Value Ref Range   Hemoglobin 6.8 (LL) 12.0 - 15.0 g/dL   HCT 84.1 (L) 32.4 - 40.1 %  Hemoglobin and hematocrit, blood     Status: Abnormal   Collection Time: 06/06/15 12:33 AM  Result Value Ref Range   Hemoglobin 7.6 (L) 12.0 -  15.0 g/dL   HCT 02.7 (L) 25.3 - 66.4 %  CBC with Differential/Platelet     Status: Abnormal   Collection Time: 06/06/15  2:00 AM  Result Value Ref Range   WBC 10.8 (H) 4.0 - 10.5 K/uL   RBC 2.70 (L) 3.87 - 5.11 MIL/uL   Hemoglobin 7.9 (L) 12.0 - 15.0 g/dL   HCT 40.3 (L) 47.4 - 25.9 %   MCV 86.7 78.0 - 100.0 fL   MCH 29.3 26.0 - 34.0 pg   MCHC 33.8 30.0 - 36.0 g/dL   RDW 56.3 (H) 87.5 - 64.3 %   Platelets 106 (L) 150 - 400 K/uL   Neutrophils Relative % 67 %   Neutro Abs 7.2 1.7 - 7.7 K/uL   Lymphocytes Relative 20 %   Lymphs Abs 2.1 0.7 - 4.0 K/uL   Monocytes Relative 11 %   Monocytes Absolute 1.2 (H) 0.1 - 1.0 K/uL   Eosinophils Relative 2 %   Eosinophils Absolute 0.2 0.0 - 0.7 K/uL   Basophils Relative 0 %   Basophils Absolute 0.0 0.0 - 0.1 K/uL  Renal function panel  Status: Abnormal   Collection Time: 06/06/15  2:00 AM  Result Value Ref Range   Sodium 146 (H) 135 - 145 mmol/L   Potassium 3.8 3.5 - 5.1 mmol/L   Chloride 119 (H) 101 - 111 mmol/L   CO2 23 22 - 32 mmol/L   Glucose, Bld 95 65 - 99 mg/dL   BUN 14 6 - 20 mg/dL   Creatinine, Ser 1.611.31 (H) 0.44 - 1.00 mg/dL   Calcium 7.4 (L) 8.9 - 10.3 mg/dL   Phosphorus 3.1 2.5 - 4.6 mg/dL   Albumin 1.6 (L) 3.5 - 5.0 g/dL   GFR calc non Af Amer 37 (L) >60 mL/min   GFR calc Af Amer 43 (L) >60 mL/min   Anion gap 4 (L) 5 - 15  Magnesium     Status: None   Collection Time: 06/06/15  2:00 AM  Result Value Ref Range   Magnesium 1.9 1.7 - 2.4 mg/dL  Protime-INR     Status: Abnormal   Collection Time: 06/06/15  3:30 AM  Result Value Ref Range   Prothrombin Time 17.7 (H) 11.6 - 15.2 seconds   INR 1.51 (H) 0.00 - 1.49  APTT     Status: None   Collection Time: 06/06/15  3:30 AM  Result Value Ref Range   aPTT 33 24 - 37 seconds  Glucose, capillary     Status: None   Collection Time: 06/06/15  3:54 AM  Result Value Ref Range   Glucose-Capillary 86 65 - 99 mg/dL  Hemoglobin and hematocrit, blood     Status: Abnormal    Collection Time: 06/06/15  8:28 AM  Result Value Ref Range   Hemoglobin 8.0 (L) 12.0 - 15.0 g/dL   HCT 09.623.9 (L) 04.536.0 - 40.946.0 %    Studies/Results: Ir Angiogram Visceral Selective  06/05/2015  INDICATION: 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 ANESTHESIA/SEDATION: 50 MCG FENTANYL ONLY Moderate Sedation Time: NONE. The patient's level of consciousness and vital signs were monitored continuously by radiology nursing throughout the procedure under my direct supervision. CONTRAST:  50mL ISOVUE-300 IOPAMIDOL (ISOVUE-300) INJECTION 61% FLUOROSCOPY TIME:  Fluoroscopy Time: 12 minutes 18 seconds (947 mGy). COMPLICATIONS: None immediate. PROCEDURE: Informed consent was obtained from the patient following explanation of the procedure, risks, benefits and alternatives. The patient understands, agrees and consents for the procedure. All questions were addressed. A time out was performed prior to the initiation of the procedure. Maximal barrier sterile technique utilized including caps, mask, sterile gowns, sterile gloves, large sterile drape, hand hygiene, and Betadine prep. Under sterile conditions and local anesthesia, ultrasound micropuncture access performed of the patent right common femoral artery. Five French sheath inserted over a guidewire. Five French C2 catheter advanced over a Bentson guidewire to the celiac artery. Selective celiac angiogram performed. Celiac: Celiac is widely patent. Left gastric, splenic and hepatic vasculature patent. GDA is patent. There is active extravasation from the gastroduodenal artery localized to the level of the duodenum compatible with a bleeding duodenal ulcer. Renegade STC micro catheter and GT micro Glidewire were advanced through the C2 catheter into the  gastroduodenal artery. Selective gastroduodenal angiogram performed. Gastroduodenal artery: Gastroduodenal artery is patent. Active bleeding present from a small distal branch of the gastroduodenal artery compatible with an active bleeding duodenal ulcer. Embolization: Micro catheter was advanced over the catheter and guidewire  to the level of the gastroduodenal artery bleeding site. Repeat injection confirms micro catheter position at the site of active bleeding. Micro coil embolization performed of the gastroduodenal artery with insertion of 3 3 mm x 6 mm interlock coils, 1 4 mm x 8 mm soft coil, 1 4 mm x 12 mm soft coil, and 1 5 mm x 8 mm soft coil. Post embolization angiogram demonstrates occlusion of the gastroduodenal artery with no further active bleeding. Micro catheter was retracted into the common hepatic artery. Common hepatic artery angiogram performed. Common hepatic: This confirms preserved patency of the common, proper, left and right hepatic arteries. Access removed. Hemostasis obtained with manual compression. No immediate complication. Patient tolerated the procedure well. 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 Successful micro coil embolization of the gastroduodenal artery to complete occlusion with no further active bleeding. Electronically Signed   By: Judie Petit.  Shick M.D.   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 ANESTHESIA/SEDATION: 50 MCG FENTANYL ONLY Moderate Sedation Time: NONE. The patient's level of consciousness and vital signs were monitored continuously by radiology  nursing throughout the procedure under my direct supervision. CONTRAST:  50mL ISOVUE-300 IOPAMIDOL (ISOVUE-300) INJECTION 61% FLUOROSCOPY TIME:  Fluoroscopy Time: 12 minutes 18 seconds (947 mGy). COMPLICATIONS: None immediate. PROCEDURE: Informed consent was obtained from the patient following explanation of the procedure, risks, benefits and alternatives. The patient understands, agrees and consents for the procedure. All questions were addressed. A time out was performed prior to the initiation of the procedure. Maximal barrier sterile technique utilized including caps, mask, sterile gowns, sterile gloves, large sterile drape, hand hygiene, and Betadine prep. Under sterile conditions and local anesthesia, ultrasound micropuncture access performed of the patent right common femoral artery. Five French sheath inserted over a guidewire. Five French C2 catheter advanced over a Bentson guidewire to the celiac artery. Selective celiac angiogram performed. Celiac: Celiac is widely patent. Left gastric, splenic and hepatic vasculature patent. GDA is patent. There is active extravasation from the gastroduodenal artery localized to the level of the duodenum compatible with a bleeding duodenal ulcer. Renegade STC micro catheter and GT micro Glidewire were advanced through the C2 catheter into the gastroduodenal artery. Selective gastroduodenal angiogram performed. Gastroduodenal artery: Gastroduodenal artery is patent. Active bleeding present from a small distal branch of the gastroduodenal artery compatible with an active bleeding duodenal ulcer. Embolization: Micro catheter was advanced over the catheter and guidewire to the level of the gastroduodenal artery bleeding site. Repeat injection confirms micro catheter position at the site of active bleeding. Micro coil embolization performed of the gastroduodenal artery with insertion of 3 3 mm x 6 mm interlock coils, 1 4 mm x 8 mm soft coil, 1 4 mm x 12 mm soft coil, and 1 5  mm x 8 mm soft coil. Post embolization angiogram demonstrates occlusion of the gastroduodenal artery with no further active bleeding. Micro catheter was retracted into the common hepatic artery. Common hepatic artery angiogram performed. Common hepatic: This confirms preserved patency of the common, proper, left and right hepatic arteries. Access removed. Hemostasis obtained with manual compression. No immediate complication. Patient tolerated the procedure well. 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 Successful micro coil embolization of the gastroduodenal artery to complete occlusion with no further active bleeding. Electronically Signed   By: Judie Petit.  Shick M.D.   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 ANESTHESIA/SEDATION: 50 MCG FENTANYL ONLY Moderate Sedation Time: NONE. The patient's level of consciousness and vital signs were monitored continuously by radiology nursing throughout the procedure under my direct supervision. CONTRAST:  50mL ISOVUE-300 IOPAMIDOL (ISOVUE-300) INJECTION 61% FLUOROSCOPY TIME:  Fluoroscopy Time: 12 minutes 18 seconds (947 mGy). COMPLICATIONS: None immediate. PROCEDURE: Informed consent was obtained from the patient following explanation of the procedure, risks, benefits and alternatives. The patient understands, agrees and consents for the procedure. All questions were addressed. A time out was performed prior to the initiation of the procedure. Maximal barrier sterile technique utilized including caps, mask, sterile gowns, sterile gloves, large sterile drape, hand  hygiene, and Betadine prep. Under sterile conditions and local anesthesia, ultrasound micropuncture access performed of the patent right common femoral artery. Five French sheath inserted over a guidewire. Five French C2 catheter advanced over a Bentson guidewire to the celiac artery. Selective celiac angiogram performed. Celiac: Celiac is widely patent. Left gastric, splenic and hepatic vasculature patent. GDA is patent. There is active extravasation from the gastroduodenal artery localized to the level of the duodenum compatible with a bleeding duodenal ulcer. Renegade STC micro catheter and GT micro Glidewire were advanced through the C2 catheter into the gastroduodenal artery. Selective gastroduodenal angiogram performed. Gastroduodenal artery: Gastroduodenal artery is patent. Active bleeding present from a small distal branch of the gastroduodenal artery compatible with an active bleeding duodenal ulcer. Embolization: Micro catheter was advanced over the catheter and guidewire to the level of the gastroduodenal artery bleeding site. Repeat injection confirms micro catheter position at the site of active bleeding. Micro coil embolization performed of the gastroduodenal artery with insertion of 3 3 mm x 6 mm interlock coils, 1 4 mm x 8 mm soft coil, 1 4 mm x 12 mm soft coil, and 1 5 mm x 8 mm soft coil. Post embolization angiogram demonstrates occlusion of the gastroduodenal artery with no further active bleeding. Micro catheter was retracted into the common hepatic artery. Common hepatic artery angiogram performed. Common hepatic: This confirms preserved patency of the common, proper, left and right hepatic arteries. Access removed. Hemostasis obtained with manual compression. No immediate complication. Patient tolerated the procedure well. 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 Successful micro coil  embolization of the gastroduodenal artery to complete occlusion with no further active bleeding. Electronically Signed   By: Judie Petit.  Shick M.D.   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 ANESTHESIA/SEDATION: 50 MCG FENTANYL ONLY Moderate Sedation Time: NONE. The patient's level of consciousness and vital signs were monitored continuously by radiology nursing throughout the procedure under my direct supervision. CONTRAST:  50mL ISOVUE-300 IOPAMIDOL (ISOVUE-300) INJECTION 61% FLUOROSCOPY TIME:  Fluoroscopy Time: 12 minutes 18 seconds (947 mGy). COMPLICATIONS: None immediate. PROCEDURE: Informed consent was obtained from the patient following explanation of the procedure, risks, benefits and alternatives. The patient understands, agrees and consents for the procedure. All questions were addressed. A time out was performed prior to the initiation of the procedure. Maximal barrier sterile technique utilized including caps, mask, sterile gowns, sterile gloves, large sterile drape, hand hygiene, and Betadine prep. Under sterile conditions and local anesthesia, ultrasound micropuncture access performed of the patent right common femoral artery. Five French sheath inserted over a guidewire. Five French C2 catheter advanced over a Bentson guidewire to the celiac artery. Selective celiac angiogram performed. Celiac: Celiac is widely patent. Left gastric, splenic and hepatic vasculature patent. GDA is patent. There is active extravasation from the gastroduodenal artery localized to the level of the duodenum compatible with a bleeding duodenal ulcer. Renegade STC micro catheter and GT micro Glidewire were advanced through the C2 catheter  into the gastroduodenal artery. Selective gastroduodenal angiogram performed. Gastroduodenal artery: Gastroduodenal artery is patent. Active bleeding present from a small distal branch of the gastroduodenal artery compatible with an active bleeding duodenal ulcer. Embolization: Micro catheter was advanced over the catheter and guidewire to the level of the gastroduodenal artery bleeding site. Repeat injection confirms micro catheter position at the site of active bleeding. Micro coil embolization performed of the gastroduodenal artery with insertion of 3 3 mm x 6 mm interlock coils, 1 4 mm x 8 mm soft coil, 1 4 mm x 12 mm soft coil, and 1 5 mm x 8 mm soft coil. Post embolization angiogram demonstrates occlusion of the gastroduodenal artery with no further active bleeding. Micro catheter was retracted into the common hepatic artery. Common hepatic artery angiogram performed. Common hepatic: This confirms preserved patency of the common, proper, left and right hepatic arteries. Access removed. Hemostasis obtained with manual compression. No immediate complication. Patient tolerated the procedure well. 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 Successful micro coil embolization of the gastroduodenal artery to complete occlusion with no further active bleeding. Electronically Signed   By: Judie Petit.  Shick M.D.   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 ANESTHESIA/SEDATION: 50 MCG FENTANYL ONLY Moderate Sedation Time: NONE. The patient's level of consciousness and vital signs were monitored  continuously by radiology nursing throughout the procedure under my direct supervision. CONTRAST:  50mL ISOVUE-300 IOPAMIDOL (ISOVUE-300) INJECTION 61% FLUOROSCOPY TIME:  Fluoroscopy Time: 12 minutes 18 seconds (947 mGy). COMPLICATIONS: None immediate. PROCEDURE: Informed consent was obtained from the patient following explanation of the procedure, risks, benefits and alternatives. The patient understands, agrees and consents for the procedure. All questions were addressed. A time out was performed prior to the initiation of the procedure. Maximal barrier sterile technique utilized including caps, mask, sterile gowns, sterile gloves, large sterile drape, hand hygiene, and Betadine prep. Under sterile conditions and local anesthesia, ultrasound micropuncture access performed of the patent right common femoral artery. Five French sheath inserted over a guidewire. Five French C2 catheter advanced over a Bentson guidewire to the celiac artery. Selective celiac  angiogram performed. Celiac: Celiac is widely patent. Left gastric, splenic and hepatic vasculature patent. GDA is patent. There is active extravasation from the gastroduodenal artery localized to the level of the duodenum compatible with a bleeding duodenal ulcer. Renegade STC micro catheter and GT micro Glidewire were advanced through the C2 catheter into the gastroduodenal artery. Selective gastroduodenal angiogram performed. Gastroduodenal artery: Gastroduodenal artery is patent. Active bleeding present from a small distal branch of the gastroduodenal artery compatible with an active bleeding duodenal ulcer. Embolization: Micro catheter was advanced over the catheter and guidewire to the level of the gastroduodenal artery bleeding site. Repeat injection confirms micro catheter position at the site of active bleeding. Micro coil embolization performed of the gastroduodenal artery with insertion of 3 3 mm x 6 mm interlock coils, 1 4 mm x 8 mm soft coil, 1 4 mm x  12 mm soft coil, and 1 5 mm x 8 mm soft coil. Post embolization angiogram demonstrates occlusion of the gastroduodenal artery with no further active bleeding. Micro catheter was retracted into the common hepatic artery. Common hepatic artery angiogram performed. Common hepatic: This confirms preserved patency of the common, proper, left and right hepatic arteries. Access removed. Hemostasis obtained with manual compression. No immediate complication. Patient tolerated the procedure well. 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 Successful micro coil embolization of the gastroduodenal artery to complete occlusion with no further active bleeding. Electronically Signed   By: Judie Petit.  Shick M.D.   On: 06/05/2015 13:27      Assessment: GI bleed from DU, sp IR embolization, H pylori neg  Plan: Cont PPI, monitor stools and Hb. If rebleeds, may need surgery    Dario Yono C 06/06/2015, 8:57 AM  Pager [phone] If no answer or after 5 PM call [phone]

## 2015-06-06 NOTE — Progress Notes (Signed)
    [facility]   Reason for visit: Follow up on Enterococcal bacteremia  Interval History: now in OR due to bleed.  Events noted.    Physical Exam: Constitutional:  Filed Vitals:   06/06/15 1430 06/06/15 1445  BP: 101/49 101/56  Pulse: 79 74  Temp: 97.7 F (36.5 C) 97.5 F (36.4 C)  Resp: 14 12  Not available  Impression: Enterococcus, likely still GI source with ongoing bleed.  In OR.    Plan: 1.  Continue ampicillin.   Will follow remotely over the weekend

## 2015-06-06 NOTE — Progress Notes (Signed)
Angioembolization was attempted but there is still a vigorous bleeding vessel not amenable to the treatment.  Will proceed directly to OR for emergency exploratory laparotomy.  Discussed with her and her husband.

## 2015-06-06 NOTE — Progress Notes (Signed)
   06/06/15 1500  Clinical Encounter Type  Visited With Family  Visit Type Initial;Psychological support;Spiritual support;Critical Care  Referral From Nurse  Consult/Referral To Chaplain  Spiritual Encounters  Spiritual Needs Emotional;Other (Comment) (Pastoral Conversation/Support)  Stress Factors  Patient Stress Factors Not reviewed  Family Stress Factors Health changes;Major life changes;Other (Comment)   I visited with the patient's husband per referral by the nurse and unit secretary. I was briefed on the patient's condition and was told that the patient's husband was having a hard time with his wife's prognosis.  When I entered the room, the patient was out having a test done and the patient's husband was sitting in a chair with his head in his hands.  He was visibly tearful and quiet. He stated that they weren't particularly spiritual when asked if they come from a particular faith background.  The patient's husband stated that he didn't know what would happen with his wife and that he was having a hard time; but did not go into any detail.  Patient's husband stated that he was fine for now and good support.   Please contact Spiritual Care for further assistance.   Chaplain Clint BolderBrittany Darlen Gledhill M.Div.

## 2015-06-06 NOTE — Progress Notes (Signed)
eLink Physician-Brief Progress Note Patient Name: Jasmine PonsBetty G Newman DOB: 07/09/1931 MRN: [id number]   Date of Service  06/06/2015  HPI/Events of Note  ABG on 50%/PRVC 16/TV 450/P 5 = 7.312/39.7/115/19.5  eICU Interventions  Will order: 1. Increase PRVC rate to 21. 2. ABG at 8 PM.     Intervention Category Major Interventions: Respiratory failure - evaluation and management;Acid-Base disturbance - evaluation and management  Valrie Jia Eugene 06/06/2015, 6:56 PM

## 2015-06-07 ENCOUNTER — Inpatient Hospital Stay (HOSPITAL_COMMUNITY): Payer: Medicare Other

## 2015-06-07 LAB — RENAL FUNCTION PANEL
ANION GAP: 7 (ref 5–15)
Albumin: 2.1 g/dL — ABNORMAL LOW (ref 3.5–5.0)
BUN: 12 mg/dL (ref 6–20)
CHLORIDE: 118 mmol/L — AB (ref 101–111)
CO2: 24 mmol/L (ref 22–32)
CREATININE: 1.27 mg/dL — AB (ref 0.44–1.00)
Calcium: 7.8 mg/dL — ABNORMAL LOW (ref 8.9–10.3)
GFR calc non Af Amer: 38 mL/min — ABNORMAL LOW (ref >60)
GFR, EST AFRICAN AMERICAN: 45 mL/min — AB (ref >60)
Glucose, Bld: 108 mg/dL — ABNORMAL HIGH (ref 65–99)
Phosphorus: 3.2 mg/dL (ref 2.5–4.6)
Potassium: 3.6 mmol/L (ref 3.5–5.1)
Sodium: 149 mmol/L — ABNORMAL HIGH (ref 135–145)

## 2015-06-07 LAB — GLUCOSE, CAPILLARY
GLUCOSE-CAPILLARY: 92 mg/dL (ref 65–99)
GLUCOSE-CAPILLARY: 88 mg/dL (ref 65–99)
GLUCOSE-CAPILLARY: 73 mg/dL (ref 65–99)
Glucose-Capillary: 103 mg/dL — ABNORMAL HIGH (ref 65–99)
Glucose-Capillary: 65 mg/dL (ref 65–99)

## 2015-06-07 LAB — BASIC METABOLIC PANEL
Anion gap: 6 (ref 5–15)
BUN: 13 mg/dL (ref 6–20)
CHLORIDE: 118 mmol/L — AB (ref 101–111)
CO2: 24 mmol/L (ref 22–32)
CREATININE: 1.32 mg/dL — AB (ref 0.44–1.00)
Calcium: 7.7 mg/dL — ABNORMAL LOW (ref 8.9–10.3)
GFR, EST AFRICAN AMERICAN: 43 mL/min — AB (ref >60)
GFR, EST NON AFRICAN AMERICAN: 37 mL/min — AB (ref >60)
Glucose, Bld: 107 mg/dL — ABNORMAL HIGH (ref 65–99)
POTASSIUM: 3.5 mmol/L (ref 3.5–5.1)
SODIUM: 148 mmol/L — AB (ref 135–145)

## 2015-06-07 LAB — CBC WITH DIFFERENTIAL/PLATELET
BASOS ABS: 0 10*3/uL (ref 0.0–0.1)
BASOS PCT: 0 %
Basophils Absolute: 0 10*3/uL (ref 0.0–0.1)
Basophils Relative: 0 %
EOS ABS: 0.2 10*3/uL (ref 0.0–0.7)
EOS PCT: 2 %
Eosinophils Absolute: 0.3 10*3/uL (ref 0.0–0.7)
Eosinophils Relative: 3 %
HCT: 18.1 % — ABNORMAL LOW (ref 36.0–46.0)
HEMATOCRIT: 20.6 % — AB (ref 36.0–46.0)
HEMOGLOBIN: 6.4 g/dL — AB (ref 12.0–15.0)
HEMOGLOBIN: 7.2 g/dL — AB (ref 12.0–15.0)
LYMPHS PCT: 15 %
Lymphocytes Relative: 10 %
Lymphs Abs: 1 10*3/uL (ref 0.7–4.0)
Lymphs Abs: 1.5 10*3/uL (ref 0.7–4.0)
MCH: 31.1 pg (ref 26.0–34.0)
MCH: 30.1 pg (ref 26.0–34.0)
MCHC: 35.4 g/dL (ref 30.0–36.0)
MCHC: 35 g/dL (ref 30.0–36.0)
MCV: 87.9 fL (ref 78.0–100.0)
MCV: 86.2 fL (ref 78.0–100.0)
Monocytes Absolute: 0.7 10*3/uL (ref 0.1–1.0)
Monocytes Absolute: 0.8 10*3/uL (ref 0.1–1.0)
Monocytes Relative: 7 %
Monocytes Relative: 8 %
NEUTROS ABS: 7.4 10*3/uL (ref 1.7–7.7)
NEUTROS PCT: 81 %
NEUTROS PCT: 74 %
Neutro Abs: 7.7 10*3/uL (ref 1.7–7.7)
Platelets: 97 10*3/uL — ABNORMAL LOW (ref 150–400)
Platelets: 122 10*3/uL — ABNORMAL LOW (ref 150–400)
RBC: 2.06 MIL/uL — ABNORMAL LOW (ref 3.87–5.11)
RBC: 2.39 MIL/uL — AB (ref 3.87–5.11)
RDW: 15.9 % — ABNORMAL HIGH (ref 11.5–15.5)
RDW: 16.2 % — ABNORMAL HIGH (ref 11.5–15.5)
WBC: 9.6 10*3/uL (ref 4.0–10.5)
WBC: 10 10*3/uL (ref 4.0–10.5)

## 2015-06-07 LAB — CBC
HCT: 21.7 % — ABNORMAL LOW (ref 36.0–46.0)
HEMOGLOBIN: 7.5 g/dL — AB (ref 12.0–15.0)
MCH: 29.8 pg (ref 26.0–34.0)
MCHC: 34.6 g/dL (ref 30.0–36.0)
MCV: 86.1 fL (ref 78.0–100.0)
Platelets: 95 10*3/uL — ABNORMAL LOW (ref 150–400)
RBC: 2.52 MIL/uL — AB (ref 3.87–5.11)
RDW: 15.7 % — ABNORMAL HIGH (ref 11.5–15.5)
WBC: 10.2 10*3/uL (ref 4.0–10.5)

## 2015-06-07 LAB — CULTURE, BLOOD (ROUTINE X 2): CULTURE: NO GROWTH

## 2015-06-07 LAB — POCT I-STAT 7, (LYTES, BLD GAS, ICA,H+H)
ACID-BASE DEFICIT: 4 mmol/L — AB (ref 0.0–2.0)
Bicarbonate: 21.4 mEq/L (ref 20.0–24.0)
CALCIUM ION: 1.02 mmol/L — AB (ref 1.13–1.30)
HEMATOCRIT: 18 % — AB (ref 36.0–46.0)
Hemoglobin: 6.1 g/dL — CL (ref 12.0–15.0)
O2 SAT: 100 %
PH ART: 7.36 (ref 7.350–7.450)
PO2 ART: 247 mmHg — AB (ref 80.0–100.0)
Potassium: 3.9 mmol/L (ref 3.5–5.1)
SODIUM: 145 mmol/L (ref 135–145)
TCO2: 23 mmol/L (ref 0–100)
pCO2 arterial: 37 mmHg (ref 35.0–45.0)

## 2015-06-07 LAB — PROTIME-INR
INR: 1.48 (ref 0.00–1.49)
INR: 1.31 (ref 0.00–1.49)
INR: 1.27 (ref 0.00–1.49)
PROTHROMBIN TIME: 17.5 s — AB (ref 11.6–15.2)
PROTHROMBIN TIME: 16 s — AB (ref 11.6–15.2)
Prothrombin Time: 15.6 seconds — ABNORMAL HIGH (ref 11.6–15.2)

## 2015-06-07 LAB — MAGNESIUM: MAGNESIUM: 1.7 mg/dL (ref 1.7–2.4)

## 2015-06-07 MED ORDER — DEXTROSE 50 % IV SOLN
INTRAVENOUS | Status: AC
  Administered 2015-06-07: 25 mL
  Filled 2015-06-07: qty 50

## 2015-06-07 MED ORDER — SODIUM CHLORIDE 0.9 % IV SOLN
Freq: Once | INTRAVENOUS | Status: AC
  Administered 2015-06-07: 09:00:00 via INTRAVENOUS

## 2015-06-07 MED ORDER — SODIUM CHLORIDE 0.9 % IV BOLUS (SEPSIS)
500.0000 mL | Freq: Once | INTRAVENOUS | Status: AC
  Administered 2015-06-07: 500 mL via INTRAVENOUS

## 2015-06-07 MED ORDER — SODIUM CHLORIDE 0.9 % IV SOLN
Freq: Once | INTRAVENOUS | Status: AC
  Administered 2015-06-07: 10:00:00 via INTRAVENOUS

## 2015-06-07 MED ORDER — FENTANYL CITRATE (PF) 100 MCG/2ML IJ SOLN
25.0000 ug | INTRAMUSCULAR | Status: DC | PRN
  Administered 2015-06-07 (×3): 50 ug via INTRAVENOUS
  Administered 2015-06-08: 25 ug via INTRAVENOUS
  Administered 2015-06-08: 50 ug via INTRAVENOUS
  Filled 2015-06-07 (×5): qty 2

## 2015-06-07 MED ORDER — SODIUM CHLORIDE 0.45 % IV SOLN
INTRAVENOUS | Status: DC
  Administered 2015-06-07: 08:00:00 via INTRAVENOUS

## 2015-06-07 NOTE — Progress Notes (Signed)
[facility] PROGRESS NOTE Subjective patient to OR yesterday for over so of bleeding duodenal ulcer. Could not be controlled with endoscopic measures or embolization.  Objective: Vital signs in last 24 hours: Temp:  [94.1 F (34.5 C)-98.2 F (36.8 C)] 97.3 F (36.3 C) (04/08 0700) Pulse Rate:  [51-119] 74 (04/08 0700) Resp:  [9-28] 17 (04/08 0700) BP: (69-138)/(33-89) 112/47 mmHg (04/08 0700) SpO2:  [84 %-100 %] 100 % (04/08 0700) Arterial Line BP: (105-119)/(68-79) 107/68 mmHg (04/07 2000) FiO2 (%):  [40 %-50 %] 40 % (04/08 0430) Weight:  [103.9 kg (229 lb 0.9 oz)] 103.9 kg (229 lb 0.9 oz) (04/08 0500) Last BM Date: 06/06/15  Intake/Output from previous day: 04/07 0701 - 04/08 0700 In: 6043.3 [I.V.:3357.3; Blood:1916; IV Piggyback:650] Out: 2060 [Urine:1770; Drains:190; Blood:100] Intake/Output this shift:      Lab Results:  Recent Labs  06/05/15 0430  06/06/15 0200 06/06/15 0828 06/06/15 1623 06/06/15 1647 06/06/15 1819 06/07/15 0200  WBC 7.5  --  10.8*  --  7.8  --  8.7 9.6  HGB 7.9*  < > 7.9* 8.0* 7.0* 6.1* 7.5* 6.4*  HCT 24.6*  < > 23.4* 23.9* 20.4* 18.0* 21.8* 18.1*  PLT 119*  --  106*  --  98*  99*  --  98* 97*  < > = values in this interval not displayed. BMET  Recent Labs  06/05/15 0430 06/06/15 0200 06/06/15 1623 06/06/15 1647 06/07/15 0200  NA 144 146* 146* 145 149*  148*  K 3.8 3.8 4.0 3.9 3.6  3.5  CL 114* 119* 119*  --  118*  118*  CO2 25 23 21*  --  24  24  CREATININE 1.36* 1.31* 1.28*  --  1.27*  1.32*   LFT  Recent Labs  06/06/15 1623  PROT 3.4*  AST 18  ALT 16  ALKPHOS 35*  BILITOT 0.5   PT/INR  Recent Labs  06/06/15 1623 06/06/15 1942 06/07/15 0200  LABPROT 18.8* 17.5* 17.5*  INR 1.63* 1.48 1.48   PANCREAS No results for input(s): LIPASE in the last 72 hours.       Studies/Results: Ir Angiogram Visceral Selective  06/06/2015  INDICATION: Gastrointestinal bleeding EXAM: SELECTIVE VISCERAL  ARTERIOGRAPHY; ADDITIONAL ARTERIOGRAPHY; IR ULTRASOUND GUIDANCE VASC ACCESS RIGHT MEDICATIONS: . The antibiotic was administered within 1 hour of the procedure ANESTHESIA/SEDATION: Moderate (conscious) sedation was employed during this procedure. A total of Versed 0 mg and Fentanyl 0 mcg was administered intravenously. Moderate Sedation Time: minutes. The patient's level of consciousness and vital signs were monitored continuously by radiology nursing throughout the procedure under my direct supervision. CONTRAST:  100 cc Omnipaque 300 FLUOROSCOPY TIME:  Fluoroscopy Time: 31 minutes 12 seconds (1469 mGy). COMPLICATIONS: None immediate. PROCEDURE: Informed consent was obtained from the patient following explanation of the procedure, risks, benefits and alternatives. The patient understands, agrees and consents for the procedure. All questions were addressed. A time out was performed prior to the initiation of the procedure. Maximal barrier sterile technique utilized including caps, mask, sterile gowns, sterile gloves, large sterile drape, hand hygiene, and Betadine prep. Right groin was prepped and draped in a sterile fashion. Under sonographic guidance, a micropuncture needle was inserted into the right common femoral artery and removed over a 018 wire which was up sized to a Bentson. A 5 French sheath was inserted. The SMA was selected. Angiography was performed. This showed abrupt extravasation of contrast from the gastroduodenal artery at the inferior extent of the coils previously placed. A micro  catheter was then advanced into the inferior pancreaticoduodenal artery. And angiography was performed. There are 2 branches leading to the area of extravasation. The micro catheter was successfully placed into the inferior branch and interlock coils were deployed. A 2 mm by 2 cm coil was deployed. Subsequently a 2 mm x 4 cm coil was deployed. A third 2 mm by 2 cm coil was deployed. Cannulation of the second branch was  unsuccessful due to the tortuous course. Final angiogram through the SMA demonstrates persistent contrast extravasation. The Cobra catheter was then exchanged for a sauce Omni catheter. The celiac axis was selected and angiography was performed. Injection of the celiac demonstrated no evidence of contrast extravasation. A micro catheter was then advanced through the celiac axis and into the common hepatic artery. Repeat angiography demonstrates no extravasation of contrast. The sauce Omni catheter was retracted to the right external iliac artery and femoral angiography was performed. The sheath was removed and hemostasis was achieved with an Exoseal device. FINDINGS: Angiography of the SMA demonstrates abrupt contrast extravasation from the gastroduodenal artery at the inferior extent of the coils. There are 2 branches leading to the gastroduodenal artery via the pancreaticoduodenal branches. One of these was successfully embolized with coils and repeat angiogram confirms occlusion. The other branch could not be reached or embolized and final angiogram confirms patency of this vessel with persistent bleeding. IMPRESSION: The diagnostic angiogram confirms persistent extravasation and active bleeding into the duodenum via the gastroduodenal artery at the inferior extent of the coils. Two branches of the Fahrenheit deep pancreaticoduodenal artery were identified as feeding vessels. One of these was successfully embolized. The other could not be embolized and persistent bleeding at the end of the procedure was noted. Injection of the celiac axis demonstrates no evidence of recurrent gastrointestinal bleeding. Electronically Signed   By: Jolaine Click M.D.   On: 06/06/2015 17:21   Ir Angiogram Visceral Selective  06/06/2015  INDICATION: Gastrointestinal bleeding EXAM: SELECTIVE VISCERAL ARTERIOGRAPHY; ADDITIONAL ARTERIOGRAPHY; IR ULTRASOUND GUIDANCE VASC ACCESS RIGHT MEDICATIONS: . The antibiotic was administered within  1 hour of the procedure ANESTHESIA/SEDATION: Moderate (conscious) sedation was employed during this procedure. A total of Versed 0 mg and Fentanyl 0 mcg was administered intravenously. Moderate Sedation Time: minutes. The patient's level of consciousness and vital signs were monitored continuously by radiology nursing throughout the procedure under my direct supervision. CONTRAST:  100 cc Omnipaque 300 FLUOROSCOPY TIME:  Fluoroscopy Time: 31 minutes 12 seconds (1469 mGy). COMPLICATIONS: None immediate. PROCEDURE: Informed consent was obtained from the patient following explanation of the procedure, risks, benefits and alternatives. The patient understands, agrees and consents for the procedure. All questions were addressed. A time out was performed prior to the initiation of the procedure. Maximal barrier sterile technique utilized including caps, mask, sterile gowns, sterile gloves, large sterile drape, hand hygiene, and Betadine prep. Right groin was prepped and draped in a sterile fashion. Under sonographic guidance, a micropuncture needle was inserted into the right common femoral artery and removed over a 018 wire which was up sized to a Bentson. A 5 French sheath was inserted. The SMA was selected. Angiography was performed. This showed abrupt extravasation of contrast from the gastroduodenal artery at the inferior extent of the coils previously placed. A micro catheter was then advanced into the inferior pancreaticoduodenal artery. And angiography was performed. There are 2 branches leading to the area of extravasation. The micro catheter was successfully placed into the inferior branch and interlock coils were deployed. A 2  mm by 2 cm coil was deployed. Subsequently a 2 mm x 4 cm coil was deployed. A third 2 mm by 2 cm coil was deployed. Cannulation of the second branch was unsuccessful due to the tortuous course. Final angiogram through the SMA demonstrates persistent contrast extravasation. The Cobra  catheter was then exchanged for a sauce Omni catheter. The celiac axis was selected and angiography was performed. Injection of the celiac demonstrated no evidence of contrast extravasation. A micro catheter was then advanced through the celiac axis and into the common hepatic artery. Repeat angiography demonstrates no extravasation of contrast. The sauce Omni catheter was retracted to the right external iliac artery and femoral angiography was performed. The sheath was removed and hemostasis was achieved with an Exoseal device. FINDINGS: Angiography of the SMA demonstrates abrupt contrast extravasation from the gastroduodenal artery at the inferior extent of the coils. There are 2 branches leading to the gastroduodenal artery via the pancreaticoduodenal branches. One of these was successfully embolized with coils and repeat angiogram confirms occlusion. The other branch could not be reached or embolized and final angiogram confirms patency of this vessel with persistent bleeding. IMPRESSION: The diagnostic angiogram confirms persistent extravasation and active bleeding into the duodenum via the gastroduodenal artery at the inferior extent of the coils. Two branches of the Fahrenheit deep pancreaticoduodenal artery were identified as feeding vessels. One of these was successfully embolized. The other could not be embolized and persistent bleeding at the end of the procedure was noted. Injection of the celiac axis demonstrates no evidence of recurrent gastrointestinal bleeding. Electronically Signed   By: Jolaine Click M.D.   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 ANESTHESIA/SEDATION: 50 MCG FENTANYL ONLY Moderate  Sedation Time: NONE. The patient's level of consciousness and vital signs were monitored continuously by radiology nursing throughout the procedure under my direct supervision. CONTRAST:  50mL ISOVUE-300 IOPAMIDOL (ISOVUE-300) INJECTION 61% FLUOROSCOPY TIME:  Fluoroscopy Time: 12 minutes 18 seconds (947 mGy). COMPLICATIONS: None immediate. PROCEDURE: Informed consent was obtained from the patient following explanation of the procedure, risks, benefits and alternatives. The patient understands, agrees and consents for the procedure. All questions were addressed. A time out was performed prior to the initiation of the procedure. Maximal barrier sterile technique utilized including caps, mask, sterile gowns, sterile gloves, large sterile drape, hand hygiene, and Betadine prep. Under sterile conditions and local anesthesia, ultrasound micropuncture access performed of the patent right common femoral artery. Five French sheath inserted over a guidewire. Five French C2 catheter advanced over a Bentson guidewire to the celiac artery. Selective celiac angiogram performed. Celiac: Celiac is widely patent. Left gastric, splenic and hepatic vasculature patent. GDA is patent. There is active extravasation from the gastroduodenal artery localized to the level of the duodenum compatible with a bleeding duodenal ulcer. Renegade STC micro catheter and GT micro Glidewire were advanced through the C2 catheter into the gastroduodenal artery. Selective gastroduodenal angiogram performed. Gastroduodenal artery: Gastroduodenal artery is patent. Active bleeding present from a small distal branch of the gastroduodenal artery compatible with an active bleeding duodenal ulcer. Embolization: Micro catheter was advanced over the catheter and guidewire to the level of the gastroduodenal artery bleeding site. Repeat injection confirms micro catheter position at the site of active bleeding.  Micro coil embolization performed of the gastroduodenal  artery with insertion of 3 3 mm x 6 mm interlock coils, 1 4 mm x 8 mm soft coil, 1 4 mm x 12 mm soft coil, and 1 5 mm x 8 mm soft coil. Post embolization angiogram demonstrates occlusion of the gastroduodenal artery with no further active bleeding. Micro catheter was retracted into the common hepatic artery. Common hepatic artery angiogram performed. Common hepatic: This confirms preserved patency of the common, proper, left and right hepatic arteries. Access removed. Hemostasis obtained with manual compression. No immediate complication. Patient tolerated the procedure well. 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 Successful micro coil embolization of the gastroduodenal artery to complete occlusion with no further active bleeding. Electronically Signed   By: Judie Petit.  Shick M.D.   On: 06/05/2015 13:27   Ir Angiogram Selective Each Additional Vessel  06/06/2015  INDICATION: Gastrointestinal bleeding EXAM: SELECTIVE VISCERAL ARTERIOGRAPHY; ADDITIONAL ARTERIOGRAPHY; IR ULTRASOUND GUIDANCE VASC ACCESS RIGHT MEDICATIONS: . The antibiotic was administered within 1 hour of the procedure ANESTHESIA/SEDATION: Moderate (conscious) sedation was employed during this procedure. A total of Versed 0 mg and Fentanyl 0 mcg was administered intravenously. Moderate Sedation Time: minutes. The patient's level of consciousness and vital signs were monitored continuously by radiology nursing throughout the procedure under my direct supervision. CONTRAST:  100 cc Omnipaque 300 FLUOROSCOPY TIME:  Fluoroscopy Time: 31 minutes 12 seconds (1469 mGy). COMPLICATIONS: None immediate. PROCEDURE: Informed consent was obtained from the patient following explanation of the procedure, risks, benefits and alternatives. The patient understands, agrees and consents for the procedure. All questions were addressed. A time out was performed prior to  the initiation of the procedure. Maximal barrier sterile technique utilized including caps, mask, sterile gowns, sterile gloves, large sterile drape, hand hygiene, and Betadine prep. Right groin was prepped and draped in a sterile fashion. Under sonographic guidance, a micropuncture needle was inserted into the right common femoral artery and removed over a 018 wire which was up sized to a Bentson. A 5 French sheath was inserted. The SMA was selected. Angiography was performed. This showed abrupt extravasation of contrast from the gastroduodenal artery at the inferior extent of the coils previously placed. A micro catheter was then advanced into the inferior pancreaticoduodenal artery. And angiography was performed. There are 2 branches leading to the area of extravasation. The micro catheter was successfully placed into the inferior branch and interlock coils were deployed. A 2 mm by 2 cm coil was deployed. Subsequently a 2 mm x 4 cm coil was deployed. A third 2 mm by 2 cm coil was deployed. Cannulation of the second branch was unsuccessful due to the tortuous course. Final angiogram through the SMA demonstrates persistent contrast extravasation. The Cobra catheter was then exchanged for a sauce Omni catheter. The celiac axis was selected and angiography was performed. Injection of the celiac demonstrated no evidence of contrast extravasation. A micro catheter was then advanced through the celiac axis and into the common hepatic artery. Repeat angiography demonstrates no extravasation of contrast. The sauce Omni catheter was retracted to the right external iliac artery and femoral angiography was performed. The sheath was removed and hemostasis was achieved with an Exoseal device. FINDINGS: Angiography of the SMA demonstrates abrupt contrast extravasation from the gastroduodenal artery at the inferior extent of the coils. There are 2 branches leading to the gastroduodenal artery via the pancreaticoduodenal  branches. One of these  was successfully embolized with coils and repeat angiogram confirms occlusion. The other branch could not be reached or embolized and final angiogram confirms patency of this vessel with persistent bleeding. IMPRESSION: The diagnostic angiogram confirms persistent extravasation and active bleeding into the duodenum via the gastroduodenal artery at the inferior extent of the coils. Two branches of the Fahrenheit deep pancreaticoduodenal artery were identified as feeding vessels. One of these was successfully embolized. The other could not be embolized and persistent bleeding at the end of the procedure was noted. Injection of the celiac axis demonstrates no evidence of recurrent gastrointestinal bleeding. Electronically Signed   By: Jolaine Click M.D.   On: 06/06/2015 17:21   Ir Angiogram Selective Each Additional Vessel  06/06/2015  INDICATION: Gastrointestinal bleeding EXAM: SELECTIVE VISCERAL ARTERIOGRAPHY; ADDITIONAL ARTERIOGRAPHY; IR ULTRASOUND GUIDANCE VASC ACCESS RIGHT MEDICATIONS: . The antibiotic was administered within 1 hour of the procedure ANESTHESIA/SEDATION: Moderate (conscious) sedation was employed during this procedure. A total of Versed 0 mg and Fentanyl 0 mcg was administered intravenously. Moderate Sedation Time: minutes. The patient's level of consciousness and vital signs were monitored continuously by radiology nursing throughout the procedure under my direct supervision. CONTRAST:  100 cc Omnipaque 300 FLUOROSCOPY TIME:  Fluoroscopy Time: 31 minutes 12 seconds (1469 mGy). COMPLICATIONS: None immediate. PROCEDURE: Informed consent was obtained from the patient following explanation of the procedure, risks, benefits and alternatives. The patient understands, agrees and consents for the procedure. All questions were addressed. A time out was performed prior to the initiation of the procedure. Maximal barrier sterile technique utilized including caps, mask, sterile gowns,  sterile gloves, large sterile drape, hand hygiene, and Betadine prep. Right groin was prepped and draped in a sterile fashion. Under sonographic guidance, a micropuncture needle was inserted into the right common femoral artery and removed over a 018 wire which was up sized to a Bentson. A 5 French sheath was inserted. The SMA was selected. Angiography was performed. This showed abrupt extravasation of contrast from the gastroduodenal artery at the inferior extent of the coils previously placed. A micro catheter was then advanced into the inferior pancreaticoduodenal artery. And angiography was performed. There are 2 branches leading to the area of extravasation. The micro catheter was successfully placed into the inferior branch and interlock coils were deployed. A 2 mm by 2 cm coil was deployed. Subsequently a 2 mm x 4 cm coil was deployed. A third 2 mm by 2 cm coil was deployed. Cannulation of the second branch was unsuccessful due to the tortuous course. Final angiogram through the SMA demonstrates persistent contrast extravasation. The Cobra catheter was then exchanged for a sauce Omni catheter. The celiac axis was selected and angiography was performed. Injection of the celiac demonstrated no evidence of contrast extravasation. A micro catheter was then advanced through the celiac axis and into the common hepatic artery. Repeat angiography demonstrates no extravasation of contrast. The sauce Omni catheter was retracted to the right external iliac artery and femoral angiography was performed. The sheath was removed and hemostasis was achieved with an Exoseal device. FINDINGS: Angiography of the SMA demonstrates abrupt contrast extravasation from the gastroduodenal artery at the inferior extent of the coils. There are 2 branches leading to the gastroduodenal artery via the pancreaticoduodenal branches. One of these was successfully embolized with coils and repeat angiogram confirms occlusion. The other branch  could not be reached or embolized and final angiogram confirms patency of this vessel with persistent bleeding. IMPRESSION: The diagnostic angiogram confirms persistent extravasation and  active bleeding into the duodenum via the gastroduodenal artery at the inferior extent of the coils. Two branches of the Fahrenheit deep pancreaticoduodenal artery were identified as feeding vessels. One of these was successfully embolized. The other could not be embolized and persistent bleeding at the end of the procedure was noted. Injection of the celiac axis demonstrates no evidence of recurrent gastrointestinal bleeding. Electronically Signed   By: Jolaine ClickArthur  Hoss M.D.   On: 06/06/2015 17:21   Ir Angiogram Selective Each Additional Vessel  06/06/2015  INDICATION: Gastrointestinal bleeding EXAM: SELECTIVE VISCERAL ARTERIOGRAPHY; ADDITIONAL ARTERIOGRAPHY; IR ULTRASOUND GUIDANCE VASC ACCESS RIGHT MEDICATIONS: . The antibiotic was administered within 1 hour of the procedure ANESTHESIA/SEDATION: Moderate (conscious) sedation was employed during this procedure. A total of Versed 0 mg and Fentanyl 0 mcg was administered intravenously. Moderate Sedation Time: minutes. The patient's level of consciousness and vital signs were monitored continuously by radiology nursing throughout the procedure under my direct supervision. CONTRAST:  100 cc Omnipaque 300 FLUOROSCOPY TIME:  Fluoroscopy Time: 31 minutes 12 seconds (1469 mGy). COMPLICATIONS: None immediate. PROCEDURE: Informed consent was obtained from the patient following explanation of the procedure, risks, benefits and alternatives. The patient understands, agrees and consents for the procedure. All questions were addressed. A time out was performed prior to the initiation of the procedure. Maximal barrier sterile technique utilized including caps, mask, sterile gowns, sterile gloves, large sterile drape, hand hygiene, and Betadine prep. Right groin was prepped and draped in a sterile  fashion. Under sonographic guidance, a micropuncture needle was inserted into the right common femoral artery and removed over a 018 wire which was up sized to a Bentson. A 5 French sheath was inserted. The SMA was selected. Angiography was performed. This showed abrupt extravasation of contrast from the gastroduodenal artery at the inferior extent of the coils previously placed. A micro catheter was then advanced into the inferior pancreaticoduodenal artery. And angiography was performed. There are 2 branches leading to the area of extravasation. The micro catheter was successfully placed into the inferior branch and interlock coils were deployed. A 2 mm by 2 cm coil was deployed. Subsequently a 2 mm x 4 cm coil was deployed. A third 2 mm by 2 cm coil was deployed. Cannulation of the second branch was unsuccessful due to the tortuous course. Final angiogram through the SMA demonstrates persistent contrast extravasation. The Cobra catheter was then exchanged for a sauce Omni catheter. The celiac axis was selected and angiography was performed. Injection of the celiac demonstrated no evidence of contrast extravasation. A micro catheter was then advanced through the celiac axis and into the common hepatic artery. Repeat angiography demonstrates no extravasation of contrast. The sauce Omni catheter was retracted to the right external iliac artery and femoral angiography was performed. The sheath was removed and hemostasis was achieved with an Exoseal device. FINDINGS: Angiography of the SMA demonstrates abrupt contrast extravasation from the gastroduodenal artery at the inferior extent of the coils. There are 2 branches leading to the gastroduodenal artery via the pancreaticoduodenal branches. One of these was successfully embolized with coils and repeat angiogram confirms occlusion. The other branch could not be reached or embolized and final angiogram confirms patency of this vessel with persistent bleeding.  IMPRESSION: The diagnostic angiogram confirms persistent extravasation and active bleeding into the duodenum via the gastroduodenal artery at the inferior extent of the coils. Two branches of the Fahrenheit deep pancreaticoduodenal artery were identified as feeding vessels. One of these was successfully embolized. The other could  not be embolized and persistent bleeding at the end of the procedure was noted. Injection of the celiac axis demonstrates no evidence of recurrent gastrointestinal bleeding. Electronically Signed   By: Jolaine Click M.D.   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 ANESTHESIA/SEDATION: 50 MCG FENTANYL ONLY Moderate Sedation Time: NONE. The patient's level of consciousness and vital signs were monitored continuously by radiology nursing throughout the procedure under my direct supervision. CONTRAST:  50mL ISOVUE-300 IOPAMIDOL (ISOVUE-300) INJECTION 61% FLUOROSCOPY TIME:  Fluoroscopy Time: 12 minutes 18 seconds (947 mGy). COMPLICATIONS: None immediate. PROCEDURE: Informed consent was obtained from the patient following explanation of the procedure, risks, benefits and alternatives. The patient understands, agrees and consents for the procedure. All questions were addressed. A time out was performed prior to the initiation of the procedure. Maximal barrier sterile technique utilized including caps, mask, sterile gowns, sterile gloves, large sterile drape, hand hygiene, and Betadine prep. Under sterile conditions and local anesthesia, ultrasound micropuncture access performed of the patent right common femoral artery. Five French sheath inserted over a guidewire. Five French C2 catheter  advanced over a Bentson guidewire to the celiac artery. Selective celiac angiogram performed. Celiac: Celiac is widely patent. Left gastric, splenic and hepatic vasculature patent. GDA is patent. There is active extravasation from the gastroduodenal artery localized to the level of the duodenum compatible with a bleeding duodenal ulcer. Renegade STC micro catheter and GT micro Glidewire were advanced through the C2 catheter into the gastroduodenal artery. Selective gastroduodenal angiogram performed. Gastroduodenal artery: Gastroduodenal artery is patent. Active bleeding present from a small distal branch of the gastroduodenal artery compatible with an active bleeding duodenal ulcer. Embolization: Micro catheter was advanced over the catheter and guidewire to the level of the gastroduodenal artery bleeding site. Repeat injection confirms micro catheter position at the site of active bleeding. Micro coil embolization performed of the gastroduodenal artery with insertion of 3 3 mm x 6 mm interlock coils, 1 4 mm x 8 mm soft coil, 1 4 mm x 12 mm soft coil, and 1 5 mm x 8 mm soft coil. Post embolization angiogram demonstrates occlusion of the gastroduodenal artery with no further active bleeding. Micro catheter was retracted into the common hepatic artery. Common hepatic artery angiogram performed. Common hepatic: This confirms preserved patency of the common, proper, left and right hepatic arteries. Access removed. Hemostasis obtained with manual compression. No immediate complication. Patient tolerated the procedure well. 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 Successful micro coil embolization of the gastroduodenal artery to complete occlusion with no further active bleeding. Electronically Signed   By: Judie Petit.  Shick M.D.   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 ANESTHESIA/SEDATION: 50 MCG FENTANYL ONLY Moderate Sedation Time: NONE. The patient's level of consciousness and vital signs were monitored continuously by radiology nursing throughout the procedure under my direct supervision. CONTRAST:  50mL ISOVUE-300 IOPAMIDOL (ISOVUE-300) INJECTION 61%  FLUOROSCOPY TIME:  Fluoroscopy Time: 12 minutes 18 seconds (947 mGy). COMPLICATIONS: None immediate. PROCEDURE: Informed consent was obtained from the patient following explanation of the procedure, risks, benefits and alternatives. The patient understands, agrees and consents for the procedure. All questions were addressed. A time out was performed prior to the initiation of the procedure. Maximal barrier sterile technique utilized including caps, mask, sterile gowns, sterile gloves, large sterile drape, hand hygiene, and Betadine prep. Under sterile conditions and local anesthesia, ultrasound micropuncture access performed of the patent right common femoral artery. Five French sheath inserted over a guidewire. Five French C2 catheter advanced over a Bentson guidewire to the celiac artery. Selective celiac angiogram performed. Celiac: Celiac is widely patent. Left gastric, splenic and hepatic vasculature patent. GDA is patent. There is active extravasation from the gastroduodenal artery localized to the level of the duodenum compatible with a bleeding duodenal ulcer. Renegade STC micro catheter and GT micro Glidewire were advanced through the C2 catheter into the gastroduodenal artery. Selective gastroduodenal angiogram performed. Gastroduodenal artery: Gastroduodenal artery is patent. Active bleeding present from a small distal branch of the gastroduodenal  artery compatible with an active bleeding duodenal ulcer. Embolization: Micro catheter was advanced over the catheter and guidewire to the level of the gastroduodenal artery bleeding site. Repeat injection confirms micro catheter position at the site of active bleeding. Micro coil embolization performed of the gastroduodenal artery with insertion of 3 3 mm x 6 mm interlock coils, 1 4 mm x 8 mm soft coil, 1 4 mm x 12 mm soft coil, and 1 5 mm x 8 mm soft coil. Post embolization angiogram demonstrates occlusion of the gastroduodenal artery with no further active bleeding. Micro catheter was retracted into the common hepatic artery. Common hepatic artery angiogram performed. Common hepatic: This confirms preserved patency of the common, proper, left and right hepatic arteries. Access removed. Hemostasis obtained with manual compression. No immediate complication. Patient tolerated the procedure well. 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 Successful micro coil embolization of the gastroduodenal artery to complete occlusion with no further active bleeding. Electronically Signed   By: Judie Petit.  Shick M.D.   On: 06/05/2015 13:27   Ir US Guide Vasc Access Right  06/06/2015  INDICATION: Gastrointestinal bleeding EXAM: SELECTIVE VISCERAL ARTERIOGRAPHY; ADDITIONAL ARTERIOGRAPHY; IR ULTRASOUND GUIDANCE VASC ACCESS RIGHT MEDICATIONS: . The antibiotic was administered within 1 hour of the procedure ANESTHESIA/SEDATION: Moderate (conscious) sedation was employed during this procedure. A total of Versed 0 mg and Fentanyl 0 mcg was administered intravenously. Moderate Sedation Time: minutes. The patient's level of consciousness and vital signs were monitored continuously by radiology nursing throughout the procedure under my direct supervision. CONTRAST:  100 cc Omnipaque 300 FLUOROSCOPY TIME:  Fluoroscopy Time: 31 minutes 12  seconds (1469 mGy). COMPLICATIONS: None immediate. PROCEDURE: Informed consent was obtained from the patient following explanation of the procedure, risks, benefits and alternatives. The patient understands, agrees and consents for the procedure. All questions were addressed. A time out was performed prior to the initiation of the procedure. Maximal barrier sterile technique utilized including caps, mask, sterile gowns, sterile gloves, large sterile drape, hand hygiene, and Betadine prep. Right groin was prepped and draped in a sterile fashion. Under sonographic guidance, a micropuncture needle was inserted into the right common femoral artery and removed over a 018 wire which was up sized to a Bentson. A 5 French sheath was inserted. The SMA was selected.  Angiography was performed. This showed abrupt extravasation of contrast from the gastroduodenal artery at the inferior extent of the coils previously placed. A micro catheter was then advanced into the inferior pancreaticoduodenal artery. And angiography was performed. There are 2 branches leading to the area of extravasation. The micro catheter was successfully placed into the inferior branch and interlock coils were deployed. A 2 mm by 2 cm coil was deployed. Subsequently a 2 mm x 4 cm coil was deployed. A third 2 mm by 2 cm coil was deployed. Cannulation of the second branch was unsuccessful due to the tortuous course. Final angiogram through the SMA demonstrates persistent contrast extravasation. The Cobra catheter was then exchanged for a sauce Omni catheter. The celiac axis was selected and angiography was performed. Injection of the celiac demonstrated no evidence of contrast extravasation. A micro catheter was then advanced through the celiac axis and into the common hepatic artery. Repeat angiography demonstrates no extravasation of contrast. The sauce Omni catheter was retracted to the right external iliac artery and femoral angiography was performed.  The sheath was removed and hemostasis was achieved with an Exoseal device. FINDINGS: Angiography of the SMA demonstrates abrupt contrast extravasation from the gastroduodenal artery at the inferior extent of the coils. There are 2 branches leading to the gastroduodenal artery via the pancreaticoduodenal branches. One of these was successfully embolized with coils and repeat angiogram confirms occlusion. The other branch could not be reached or embolized and final angiogram confirms patency of this vessel with persistent bleeding. IMPRESSION: The diagnostic angiogram confirms persistent extravasation and active bleeding into the duodenum via the gastroduodenal artery at the inferior extent of the coils. Two branches of the Fahrenheit deep pancreaticoduodenal artery were identified as feeding vessels. One of these was successfully embolized. The other could not be embolized and persistent bleeding at the end of the procedure was noted. Injection of the celiac axis demonstrates no evidence of recurrent gastrointestinal bleeding. Electronically Signed   By: Jolaine Click M.D.   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 ANESTHESIA/SEDATION: 50 MCG FENTANYL ONLY Moderate Sedation Time: NONE. The patient's level of consciousness and vital signs were monitored continuously by radiology nursing throughout the procedure under my direct supervision. CONTRAST:  50mL ISOVUE-300 IOPAMIDOL (ISOVUE-300) INJECTION 61% FLUOROSCOPY TIME:  Fluoroscopy Time: 12 minutes 18 seconds (947 mGy). COMPLICATIONS: None immediate. PROCEDURE: Informed consent was obtained from the patient following explanation of the procedure, risks, benefits and  alternatives. The patient understands, agrees and consents for the procedure. All questions were addressed. A time out was performed prior to the initiation of the procedure. Maximal barrier sterile technique utilized including caps, mask, sterile gowns, sterile gloves, large sterile drape, hand hygiene, and Betadine prep. Under sterile conditions and local anesthesia, ultrasound micropuncture access performed of the patent right common femoral artery. Five French sheath inserted over a guidewire. Five French C2 catheter advanced over a Bentson guidewire to the celiac artery. Selective celiac angiogram performed. Celiac: Celiac is widely patent. Left gastric, splenic and hepatic vasculature patent. GDA is patent. There is active extravasation from the gastroduodenal artery localized to the level of the duodenum compatible with a bleeding duodenal ulcer. Renegade STC micro catheter and GT micro Glidewire were advanced through the  C2 catheter into the gastroduodenal artery. Selective gastroduodenal angiogram performed. Gastroduodenal artery: Gastroduodenal artery is patent. Active bleeding present from a small distal branch of the gastroduodenal artery compatible with an active bleeding duodenal ulcer. Embolization: Micro catheter was advanced over the catheter and guidewire to the level of the gastroduodenal artery bleeding site. Repeat injection confirms micro catheter position at the site of active bleeding. Micro coil embolization performed of the gastroduodenal artery with insertion of 3 3 mm x 6 mm interlock coils, 1 4 mm x 8 mm soft coil, 1 4 mm x 12 mm soft coil, and 1 5 mm x 8 mm soft coil. Post embolization angiogram demonstrates occlusion of the gastroduodenal artery with no further active bleeding. Micro catheter was retracted into the common hepatic artery. Common hepatic artery angiogram performed. Common hepatic: This confirms preserved patency of the common, proper, left and right hepatic arteries.  Access removed. Hemostasis obtained with manual compression. No immediate complication. Patient tolerated the procedure well. 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 Successful micro coil embolization of the gastroduodenal artery to complete occlusion with no further active bleeding. Electronically Signed   By: Judie Petit.  Shick M.D.   On: 06/05/2015 13:27   Dg Chest Port 1 View  06/07/2015  CLINICAL DATA:  82 year old female with a history of questionable finding on prior study. Endotracheal tube. Central line. EXAM: PORTABLE CHEST 1 VIEW COMPARISON:  06/06/2015, 06/03/2015 FINDINGS: Cardiomediastinal silhouette unchanged. Atherosclerotic calcification of the aortic arch. 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 Endotracheal tube remains in position terminating approximately 3.5 cm above the carina. Unchanged right IJ approach central venous catheter appearing to terminate in the superior vena cava. Gastric tube unchanged terminates in the left upper quadrant. Low lung volumes with mixed interstitial and airspace opacity at the right base partially obscuring the right heart border on the right hemidiaphragm. Retrocardiac region not well visualized with blunting of left costophrenic angle. No pneumothorax. IMPRESSION: Endotracheal tube terminates suitably above the carina. Otherwise unchanged support apparatus. Similar appearance of basilar, right greater than left interstitial and airspace opacities, potentially atelectasis, edema, and/ or consolidation. Small pleural effusions not excluded. Signed, Yvone Neu. Loreta Ave, DO [facility] Electronically Signed   By: Gilmer Mor D.O.   On: 06/07/2015 07:06   Dg Chest Port 1 View  06/06/2015  CLINICAL DATA:  82 year old with bleeding duodenal ulcer for which the patient has undergone transcatheter  therapy, ventilator dependent respiratory failure. EXAM: PORTABLE CHEST 1 VIEW COMPARISON:  06/02/2015 and earlier. FINDINGS: Endotracheal tube tip in satisfactory position projecting approximately 2 cm above the carina. Right jugular central venous catheter tip projects over the lower SVC. Nasogastric tube courses below the diaphragm into the stomach. Interval worsening of atelectasis in the lung bases, with dense atelectasis at the left lung base. Lungs otherwise clear. Pulmonary vascularity normal. Prior sternotomy CABG with stable mild cardiomegaly. IMPRESSION: 1. Support apparatus satisfactory. 2. Worsening atelectasis in the lung bases, left greater than right, since the examination 4 days ago. 3. Stable mild cardiomegaly without pulmonary edema. Electronically Signed   By: Hulan Saas M.D.   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 ANESTHESIA/SEDATION: 50 MCG FENTANYL ONLY Moderate Sedation Time: NONE. The patient's level of consciousness and vital signs were monitored continuously by radiology nursing throughout the procedure under my direct supervision. CONTRAST:  50mL ISOVUE-300 IOPAMIDOL (ISOVUE-300) INJECTION 61% FLUOROSCOPY TIME:  Fluoroscopy Time: 12 minutes 18 seconds (947 mGy). COMPLICATIONS: None immediate. PROCEDURE: Informed consent was obtained from the patient following explanation of the procedure, risks, benefits and alternatives. The patient understands, agrees and consents for the procedure. All questions were addressed. A time out was performed prior to the initiation of the procedure. Maximal barrier sterile technique utilized including caps, mask, sterile  gowns, sterile gloves, large sterile drape, hand hygiene, and Betadine prep. Under sterile conditions and local anesthesia, ultrasound micropuncture access performed of the patent right common femoral artery. Five French sheath inserted over a guidewire. Five French C2 catheter advanced over a Bentson guidewire to the celiac artery. Selective celiac angiogram performed. Celiac: Celiac is widely patent. Left gastric, splenic and hepatic vasculature patent. GDA is patent. There is active extravasation from the gastroduodenal artery localized to the level of the duodenum compatible with a bleeding duodenal ulcer. Renegade STC micro catheter and GT micro Glidewire were advanced through the C2 catheter into the gastroduodenal artery. Selective gastroduodenal angiogram performed. Gastroduodenal artery: Gastroduodenal artery is patent. Active bleeding present from a small distal branch of the gastroduodenal artery compatible with an active bleeding duodenal ulcer. Embolization: Micro catheter was advanced over the catheter and guidewire to the level of the gastroduodenal artery bleeding site. Repeat injection confirms micro catheter position at the site of active bleeding. Micro coil embolization performed of the gastroduodenal artery with insertion of 3 3 mm x 6 mm interlock coils, 1 4 mm x 8 mm soft coil, 1 4 mm x 12 mm soft coil, and 1 5 mm x 8 mm soft coil. Post embolization angiogram demonstrates occlusion of the gastroduodenal artery with no further active bleeding. Micro catheter was retracted into the common hepatic artery. Common hepatic artery angiogram performed. Common hepatic: This confirms preserved patency of the common, proper, left and right hepatic arteries. Access removed. Hemostasis obtained with manual compression. No immediate complication. Patient tolerated the procedure well. IMPRESSION: Active bleeding from a small branch peripherally of the gastroduodenal artery compatible with bleeding from a  known giant duodenal ulcer. Successful micro coil embolization of the gastroduodenal artery to complete occlusion with no further active bleeding. Electronically Signed   By: Judie Petit.  Shick M.D.   On: 06/05/2015 13:27    Medications: I have reviewed the patient's current medications.  Assessment/Plan: 1. Bleeding duodenal ulcer status postsurgical oversew. We will be available if we can help any further please give [place] a call.   Kylie Simmonds JR,Alma Muegge L 06/07/2015, 7:27 AM  This note was created using voice recognition software. Minor errors may Have occurred unintentionally.  Pager: [phone] If no answer or after hours call [phone]

## 2015-06-07 NOTE — Progress Notes (Signed)
PULMONARY / CRITICAL CARE MEDICINE   Name: Jasmine Newman MRN: [id number] DOB: Dec 20, 1931    ADMISSION DATE:  06/01/2015 CONSULTATION DATE:  06/01/15  REFERRING MD:  ED  CHIEF COMPLAINT:  GI bleed, hypotension  HISTORY OF PRESENT ILLNESS:   82 year old with past mental history of hypothyroidism, hypertension, hyperlipidemia, depression. Admitted today to the ED after she had a fall at home. She is watching the [facility] basketball game yesterday with her husband. When she stood up to use the bathroom her legs gave out and she fell on the right side. She hurt her leg and chest. She did not hit her head and there was no loss of consciousness. She on the floor the whole night. When her son arrived next morning she was found to be lying in a large pool of melanotic stool. In the ED she was found to be hypertensive with hemoglobin of 7.4 (baseline unknown). She continued to be hypotensive in spite of 2 L of fluid. GI evaluated the patient. PCCM called for admission.  SUBJECTIVE: Patient developed recurrent GI bleeding and worsening hypotension. Taken emergently to interventional radiology with attempt at embolization but failed. Then taken to the OR for emergent surgery.  REVIEW OF SYSTEMS:  Unable to obtain due to intubation.  VITAL SIGNS: BP 117/54 mmHg  Pulse 78  Temp(Src) 97.5 F (36.4 C) (Core (Comment))  Resp 23  Ht 5\' 5"  (1.651 m)  Wt 229 lb 0.9 oz (103.9 kg)  BMI 38.12 kg/m2  SpO2 97%  LMP  (LMP Unknown)  HEMODYNAMICS: CVP:  [4 mmHg-14 mmHg] 11 mmHg  VENTILATOR SETTINGS: Vent Mode:  [-] PRVC FiO2 (%):  [40 %-50 %] 40 % Set Rate:  [16 bmp-21 bmp] 21 bmp Vt Set:  [450 mL] 450 mL PEEP:  [5 cmH20] 5 cmH20 Plateau Pressure:  [15 cmH20-16 cmH20] 15 cmH20  INTAKE / OUTPUT: I/O last 3 completed shifts: In: 8337.3 [I.V.:5221.3; Blood:2196; Other:120; IV Piggyback:800] Out: 2985 [Urine:2695; Drains:190; Blood:100]  PHYSICAL EXAMINATION: General:  Awake. No distress. Currently  intubated. No family at bedside. Neuro: Following commands. Nods appropriately. Grossly nonfocal. HEENT: Endotracheal tube in place. No scleral icterus or injection. Cardiovascular:  Sinus rhythm with regular rate. No appreciable JVD.  Lungs:  Coarse breath sounds bilaterally. Symmetric chest wall rise on ventilator. Abdomen: Soft. Normal bowel sounds. Drain in place. Integument:  Warm & dry. No rash on exposed skin.  LABS:  BMET  Recent Labs Lab 06/06/15 0200 06/06/15 1623 06/06/15 1647 06/07/15 0200  NA 146* 146* 145 149*  148*  K 3.8 4.0 3.9 3.6  3.5  CL 119* 119*  --  118*  118*  CO2 23 21*  --  24  24  BUN 14 13  --  12  13  CREATININE 1.31* 1.28*  --  1.27*  1.32*  GLUCOSE 95 132*  --  108*  107*    Electrolytes  Recent Labs Lab 06/05/15 0430 06/06/15 0200 06/06/15 1623 06/07/15 0200  CALCIUM 7.7* 7.4* 7.0* 7.8*  7.7*  MG 2.1 1.9  --  1.7  PHOS 2.6 3.1  --  3.2    CBC  Recent Labs Lab 06/06/15 1623 06/06/15 1647 06/06/15 1819 06/07/15 0200  WBC 7.8  --  8.7 9.6  HGB 7.0* 6.1* 7.5* 6.4*  HCT 20.4* 18.0* 21.8* 18.1*  PLT 98*  99*  --  98* 97*    Coag's  Recent Labs Lab 06/03/15 1000 06/06/15 0330 06/06/15 1623 06/06/15 1942 06/07/15 0200  APTT 33  33 33  --   --   INR 1.27 1.51* 1.63* 1.48 1.48    Sepsis Markers  Recent Labs Lab 06/01/15 1529 06/01/15 2054 06/01/15 2100 06/02/15 0034  LATICACIDVEN 0.87 0.7  --  1.0  PROCALCITON  --   --  0.63  --     ABG  Recent Labs Lab 06/03/15 0401 06/06/15 1647 06/06/15 2045  PHART 7.332* 7.360 7.358  PCO2ART 40.0 37.0 37.0  PO2ART 82.4 247.0* 89.2    Liver Enzymes  Recent Labs Lab 06/01/15 1125  06/06/15 0200 06/06/15 1623 06/07/15 0200  AST 25  --   --  18  --   ALT 19  --   --  16  --   ALKPHOS 64  --   --  35*  --   BILITOT 0.5  --   --  0.5  --   ALBUMIN 2.4*  < > 1.6* 1.7* 2.1*  < > = values in this interval not displayed.  Cardiac Enzymes  Recent  Labs Lab 06/02/15 1656 06/03/15 1800 06/04/15 0400  TROPONINI 3.27* 1.26* 1.27*    Glucose  Recent Labs Lab 06/06/15 0354 06/06/15 0809 06/06/15 1809 06/06/15 2000 06/06/15 2320 06/07/15 0444  GLUCAP 86 90 124* 123* 103* 92    Imaging Ir Angiogram Visceral Selective  06/06/2015  INDICATION: Gastrointestinal bleeding EXAM: SELECTIVE VISCERAL ARTERIOGRAPHY; ADDITIONAL ARTERIOGRAPHY; IR ULTRASOUND GUIDANCE VASC ACCESS RIGHT MEDICATIONS: . The antibiotic was administered within 1 hour of the procedure ANESTHESIA/SEDATION: Moderate (conscious) sedation was employed during this procedure. A total of Versed 0 mg and Fentanyl 0 mcg was administered intravenously. Moderate Sedation Time: minutes. The patient's level of consciousness and vital signs were monitored continuously by radiology nursing throughout the procedure under my direct supervision. CONTRAST:  100 cc Omnipaque 300 FLUOROSCOPY TIME:  Fluoroscopy Time: 31 minutes 12 seconds (1469 mGy). COMPLICATIONS: None immediate. PROCEDURE: Informed consent was obtained from the patient following explanation of the procedure, risks, benefits and alternatives. The patient understands, agrees and consents for the procedure. All questions were addressed. A time out was performed prior to the initiation of the procedure. Maximal barrier sterile technique utilized including caps, mask, sterile gowns, sterile gloves, large sterile drape, hand hygiene, and Betadine prep. Right groin was prepped and draped in a sterile fashion. Under sonographic guidance, a micropuncture needle was inserted into the right common femoral artery and removed over a 018 wire which was up sized to a Bentson. A 5 French sheath was inserted. The SMA was selected. Angiography was performed. This showed abrupt extravasation of contrast from the gastroduodenal artery at the inferior extent of the coils previously placed. A micro catheter was then advanced into the inferior  pancreaticoduodenal artery. And angiography was performed. There are 2 branches leading to the area of extravasation. The micro catheter was successfully placed into the inferior branch and interlock coils were deployed. A 2 mm by 2 cm coil was deployed. Subsequently a 2 mm x 4 cm coil was deployed. A third 2 mm by 2 cm coil was deployed. Cannulation of the second branch was unsuccessful due to the tortuous course. Final angiogram through the SMA demonstrates persistent contrast extravasation. The Cobra catheter was then exchanged for a sauce Omni catheter. The celiac axis was selected and angiography was performed. Injection of the celiac demonstrated no evidence of contrast extravasation. A micro catheter was then advanced through the celiac axis and into the common hepatic artery. Repeat angiography demonstrates no extravasation of contrast. The sauce Omni  catheter was retracted to the right external iliac artery and femoral angiography was performed. The sheath was removed and hemostasis was achieved with an Exoseal device. FINDINGS: Angiography of the SMA demonstrates abrupt contrast extravasation from the gastroduodenal artery at the inferior extent of the coils. There are 2 branches leading to the gastroduodenal artery via the pancreaticoduodenal branches. One of these was successfully embolized with coils and repeat angiogram confirms occlusion. The other branch could not be reached or embolized and final angiogram confirms patency of this vessel with persistent bleeding. IMPRESSION: The diagnostic angiogram confirms persistent extravasation and active bleeding into the duodenum via the gastroduodenal artery at the inferior extent of the coils. Two branches of the Fahrenheit deep pancreaticoduodenal artery were identified as feeding vessels. One of these was successfully embolized. The other could not be embolized and persistent bleeding at the end of the procedure was noted. Injection of the celiac axis  demonstrates no evidence of recurrent gastrointestinal bleeding. Electronically Signed   By: Jolaine ClickArthur  Hoss M.D.   On: 06/06/2015 17:21   Ir Angiogram Visceral Selective  06/06/2015  INDICATION: Gastrointestinal bleeding EXAM: SELECTIVE VISCERAL ARTERIOGRAPHY; ADDITIONAL ARTERIOGRAPHY; IR ULTRASOUND GUIDANCE VASC ACCESS RIGHT MEDICATIONS: . The antibiotic was administered within 1 hour of the procedure ANESTHESIA/SEDATION: Moderate (conscious) sedation was employed during this procedure. A total of Versed 0 mg and Fentanyl 0 mcg was administered intravenously. Moderate Sedation Time: minutes. The patient's level of consciousness and vital signs were monitored continuously by radiology nursing throughout the procedure under my direct supervision. CONTRAST:  100 cc Omnipaque 300 FLUOROSCOPY TIME:  Fluoroscopy Time: 31 minutes 12 seconds (1469 mGy). COMPLICATIONS: None immediate. PROCEDURE: Informed consent was obtained from the patient following explanation of the procedure, risks, benefits and alternatives. The patient understands, agrees and consents for the procedure. All questions were addressed. A time out was performed prior to the initiation of the procedure. Maximal barrier sterile technique utilized including caps, mask, sterile gowns, sterile gloves, large sterile drape, hand hygiene, and Betadine prep. Right groin was prepped and draped in a sterile fashion. Under sonographic guidance, a micropuncture needle was inserted into the right common femoral artery and removed over a 018 wire which was up sized to a Bentson. A 5 French sheath was inserted. The SMA was selected. Angiography was performed. This showed abrupt extravasation of contrast from the gastroduodenal artery at the inferior extent of the coils previously placed. A micro catheter was then advanced into the inferior pancreaticoduodenal artery. And angiography was performed. There are 2 branches leading to the area of extravasation. The micro  catheter was successfully placed into the inferior branch and interlock coils were deployed. A 2 mm by 2 cm coil was deployed. Subsequently a 2 mm x 4 cm coil was deployed. A third 2 mm by 2 cm coil was deployed. Cannulation of the second branch was unsuccessful due to the tortuous course. Final angiogram through the SMA demonstrates persistent contrast extravasation. The Cobra catheter was then exchanged for a sauce Omni catheter. The celiac axis was selected and angiography was performed. Injection of the celiac demonstrated no evidence of contrast extravasation. A micro catheter was then advanced through the celiac axis and into the common hepatic artery. Repeat angiography demonstrates no extravasation of contrast. The sauce Omni catheter was retracted to the right external iliac artery and femoral angiography was performed. The sheath was removed and hemostasis was achieved with an Exoseal device. FINDINGS: Angiography of the SMA demonstrates abrupt contrast extravasation from the gastroduodenal artery at  the inferior extent of the coils. There are 2 branches leading to the gastroduodenal artery via the pancreaticoduodenal branches. One of these was successfully embolized with coils and repeat angiogram confirms occlusion. The other branch could not be reached or embolized and final angiogram confirms patency of this vessel with persistent bleeding. IMPRESSION: The diagnostic angiogram confirms persistent extravasation and active bleeding into the duodenum via the gastroduodenal artery at the inferior extent of the coils. Two branches of the Fahrenheit deep pancreaticoduodenal artery were identified as feeding vessels. One of these was successfully embolized. The other could not be embolized and persistent bleeding at the end of the procedure was noted. Injection of the celiac axis demonstrates no evidence of recurrent gastrointestinal bleeding. Electronically Signed   By: Jolaine Click M.D.   On: 06/06/2015  17:21   Ir Angiogram Selective Each Additional Vessel  06/06/2015  INDICATION: Gastrointestinal bleeding EXAM: SELECTIVE VISCERAL ARTERIOGRAPHY; ADDITIONAL ARTERIOGRAPHY; IR ULTRASOUND GUIDANCE VASC ACCESS RIGHT MEDICATIONS: . The antibiotic was administered within 1 hour of the procedure ANESTHESIA/SEDATION: Moderate (conscious) sedation was employed during this procedure. A total of Versed 0 mg and Fentanyl 0 mcg was administered intravenously. Moderate Sedation Time: minutes. The patient's level of consciousness and vital signs were monitored continuously by radiology nursing throughout the procedure under my direct supervision. CONTRAST:  100 cc Omnipaque 300 FLUOROSCOPY TIME:  Fluoroscopy Time: 31 minutes 12 seconds (1469 mGy). COMPLICATIONS: None immediate. PROCEDURE: Informed consent was obtained from the patient following explanation of the procedure, risks, benefits and alternatives. The patient understands, agrees and consents for the procedure. All questions were addressed. A time out was performed prior to the initiation of the procedure. Maximal barrier sterile technique utilized including caps, mask, sterile gowns, sterile gloves, large sterile drape, hand hygiene, and Betadine prep. Right groin was prepped and draped in a sterile fashion. Under sonographic guidance, a micropuncture needle was inserted into the right common femoral artery and removed over a 018 wire which was up sized to a Bentson. A 5 French sheath was inserted. The SMA was selected. Angiography was performed. This showed abrupt extravasation of contrast from the gastroduodenal artery at the inferior extent of the coils previously placed. A micro catheter was then advanced into the inferior pancreaticoduodenal artery. And angiography was performed. There are 2 branches leading to the area of extravasation. The micro catheter was successfully placed into the inferior branch and interlock coils were deployed. A 2 mm by 2 cm coil was  deployed. Subsequently a 2 mm x 4 cm coil was deployed. A third 2 mm by 2 cm coil was deployed. Cannulation of the second branch was unsuccessful due to the tortuous course. Final angiogram through the SMA demonstrates persistent contrast extravasation. The Cobra catheter was then exchanged for a sauce Omni catheter. The celiac axis was selected and angiography was performed. Injection of the celiac demonstrated no evidence of contrast extravasation. A micro catheter was then advanced through the celiac axis and into the common hepatic artery. Repeat angiography demonstrates no extravasation of contrast. The sauce Omni catheter was retracted to the right external iliac artery and femoral angiography was performed. The sheath was removed and hemostasis was achieved with an Exoseal device. FINDINGS: Angiography of the SMA demonstrates abrupt contrast extravasation from the gastroduodenal artery at the inferior extent of the coils. There are 2 branches leading to the gastroduodenal artery via the pancreaticoduodenal branches. One of these was successfully embolized with coils and repeat angiogram confirms occlusion. The other branch could not be  reached or embolized and final angiogram confirms patency of this vessel with persistent bleeding. IMPRESSION: The diagnostic angiogram confirms persistent extravasation and active bleeding into the duodenum via the gastroduodenal artery at the inferior extent of the coils. Two branches of the Fahrenheit deep pancreaticoduodenal artery were identified as feeding vessels. One of these was successfully embolized. The other could not be embolized and persistent bleeding at the end of the procedure was noted. Injection of the celiac axis demonstrates no evidence of recurrent gastrointestinal bleeding. Electronically Signed   By: Jolaine Click M.D.   On: 06/06/2015 17:21   Ir Angiogram Selective Each Additional Vessel  06/06/2015  INDICATION: Gastrointestinal bleeding EXAM:  SELECTIVE VISCERAL ARTERIOGRAPHY; ADDITIONAL ARTERIOGRAPHY; IR ULTRASOUND GUIDANCE VASC ACCESS RIGHT MEDICATIONS: . The antibiotic was administered within 1 hour of the procedure ANESTHESIA/SEDATION: Moderate (conscious) sedation was employed during this procedure. A total of Versed 0 mg and Fentanyl 0 mcg was administered intravenously. Moderate Sedation Time: minutes. The patient's level of consciousness and vital signs were monitored continuously by radiology nursing throughout the procedure under my direct supervision. CONTRAST:  100 cc Omnipaque 300 FLUOROSCOPY TIME:  Fluoroscopy Time: 31 minutes 12 seconds (1469 mGy). COMPLICATIONS: None immediate. PROCEDURE: Informed consent was obtained from the patient following explanation of the procedure, risks, benefits and alternatives. The patient understands, agrees and consents for the procedure. All questions were addressed. A time out was performed prior to the initiation of the procedure. Maximal barrier sterile technique utilized including caps, mask, sterile gowns, sterile gloves, large sterile drape, hand hygiene, and Betadine prep. Right groin was prepped and draped in a sterile fashion. Under sonographic guidance, a micropuncture needle was inserted into the right common femoral artery and removed over a 018 wire which was up sized to a Bentson. A 5 French sheath was inserted. The SMA was selected. Angiography was performed. This showed abrupt extravasation of contrast from the gastroduodenal artery at the inferior extent of the coils previously placed. A micro catheter was then advanced into the inferior pancreaticoduodenal artery. And angiography was performed. There are 2 branches leading to the area of extravasation. The micro catheter was successfully placed into the inferior branch and interlock coils were deployed. A 2 mm by 2 cm coil was deployed. Subsequently a 2 mm x 4 cm coil was deployed. A third 2 mm by 2 cm coil was deployed. Cannulation of the  second branch was unsuccessful due to the tortuous course. Final angiogram through the SMA demonstrates persistent contrast extravasation. The Cobra catheter was then exchanged for a sauce Omni catheter. The celiac axis was selected and angiography was performed. Injection of the celiac demonstrated no evidence of contrast extravasation. A micro catheter was then advanced through the celiac axis and into the common hepatic artery. Repeat angiography demonstrates no extravasation of contrast. The sauce Omni catheter was retracted to the right external iliac artery and femoral angiography was performed. The sheath was removed and hemostasis was achieved with an Exoseal device. FINDINGS: Angiography of the SMA demonstrates abrupt contrast extravasation from the gastroduodenal artery at the inferior extent of the coils. There are 2 branches leading to the gastroduodenal artery via the pancreaticoduodenal branches. One of these was successfully embolized with coils and repeat angiogram confirms occlusion. The other branch could not be reached or embolized and final angiogram confirms patency of this vessel with persistent bleeding. IMPRESSION: The diagnostic angiogram confirms persistent extravasation and active bleeding into the duodenum via the gastroduodenal artery at the inferior extent of the coils.  Two branches of the Fahrenheit deep pancreaticoduodenal artery were identified as feeding vessels. One of these was successfully embolized. The other could not be embolized and persistent bleeding at the end of the procedure was noted. Injection of the celiac axis demonstrates no evidence of recurrent gastrointestinal bleeding. Electronically Signed   By: Jolaine Click M.D.   On: 06/06/2015 17:21   Ir Angiogram Selective Each Additional Vessel  06/06/2015  INDICATION: Gastrointestinal bleeding EXAM: SELECTIVE VISCERAL ARTERIOGRAPHY; ADDITIONAL ARTERIOGRAPHY; IR ULTRASOUND GUIDANCE VASC ACCESS RIGHT MEDICATIONS: . The  antibiotic was administered within 1 hour of the procedure ANESTHESIA/SEDATION: Moderate (conscious) sedation was employed during this procedure. A total of Versed 0 mg and Fentanyl 0 mcg was administered intravenously. Moderate Sedation Time: minutes. The patient's level of consciousness and vital signs were monitored continuously by radiology nursing throughout the procedure under my direct supervision. CONTRAST:  100 cc Omnipaque 300 FLUOROSCOPY TIME:  Fluoroscopy Time: 31 minutes 12 seconds (1469 mGy). COMPLICATIONS: None immediate. PROCEDURE: Informed consent was obtained from the patient following explanation of the procedure, risks, benefits and alternatives. The patient understands, agrees and consents for the procedure. All questions were addressed. A time out was performed prior to the initiation of the procedure. Maximal barrier sterile technique utilized including caps, mask, sterile gowns, sterile gloves, large sterile drape, hand hygiene, and Betadine prep. Right groin was prepped and draped in a sterile fashion. Under sonographic guidance, a micropuncture needle was inserted into the right common femoral artery and removed over a 018 wire which was up sized to a Bentson. A 5 French sheath was inserted. The SMA was selected. Angiography was performed. This showed abrupt extravasation of contrast from the gastroduodenal artery at the inferior extent of the coils previously placed. A micro catheter was then advanced into the inferior pancreaticoduodenal artery. And angiography was performed. There are 2 branches leading to the area of extravasation. The micro catheter was successfully placed into the inferior branch and interlock coils were deployed. A 2 mm by 2 cm coil was deployed. Subsequently a 2 mm x 4 cm coil was deployed. A third 2 mm by 2 cm coil was deployed. Cannulation of the second branch was unsuccessful due to the tortuous course. Final angiogram through the SMA demonstrates persistent  contrast extravasation. The Cobra catheter was then exchanged for a sauce Omni catheter. The celiac axis was selected and angiography was performed. Injection of the celiac demonstrated no evidence of contrast extravasation. A micro catheter was then advanced through the celiac axis and into the common hepatic artery. Repeat angiography demonstrates no extravasation of contrast. The sauce Omni catheter was retracted to the right external iliac artery and femoral angiography was performed. The sheath was removed and hemostasis was achieved with an Exoseal device. FINDINGS: Angiography of the SMA demonstrates abrupt contrast extravasation from the gastroduodenal artery at the inferior extent of the coils. There are 2 branches leading to the gastroduodenal artery via the pancreaticoduodenal branches. One of these was successfully embolized with coils and repeat angiogram confirms occlusion. The other branch could not be reached or embolized and final angiogram confirms patency of this vessel with persistent bleeding. IMPRESSION: The diagnostic angiogram confirms persistent extravasation and active bleeding into the duodenum via the gastroduodenal artery at the inferior extent of the coils. Two branches of the Fahrenheit deep pancreaticoduodenal artery were identified as feeding vessels. One of these was successfully embolized. The other could not be embolized and persistent bleeding at the end of the procedure was noted. Injection of  the celiac axis demonstrates no evidence of recurrent gastrointestinal bleeding. Electronically Signed   By: Jolaine Click M.D.   On: 06/06/2015 17:21   Ir US Guide Vasc Access Right  06/06/2015  INDICATION: Gastrointestinal bleeding EXAM: SELECTIVE VISCERAL ARTERIOGRAPHY; ADDITIONAL ARTERIOGRAPHY; IR ULTRASOUND GUIDANCE VASC ACCESS RIGHT MEDICATIONS: . The antibiotic was administered within 1 hour of the procedure ANESTHESIA/SEDATION: Moderate (conscious) sedation was employed during  this procedure. A total of Versed 0 mg and Fentanyl 0 mcg was administered intravenously. Moderate Sedation Time: minutes. The patient's level of consciousness and vital signs were monitored continuously by radiology nursing throughout the procedure under my direct supervision. CONTRAST:  100 cc Omnipaque 300 FLUOROSCOPY TIME:  Fluoroscopy Time: 31 minutes 12 seconds (1469 mGy). COMPLICATIONS: None immediate. PROCEDURE: Informed consent was obtained from the patient following explanation of the procedure, risks, benefits and alternatives. The patient understands, agrees and consents for the procedure. All questions were addressed. A time out was performed prior to the initiation of the procedure. Maximal barrier sterile technique utilized including caps, mask, sterile gowns, sterile gloves, large sterile drape, hand hygiene, and Betadine prep. Right groin was prepped and draped in a sterile fashion. Under sonographic guidance, a micropuncture needle was inserted into the right common femoral artery and removed over a 018 wire which was up sized to a Bentson. A 5 French sheath was inserted. The SMA was selected. Angiography was performed. This showed abrupt extravasation of contrast from the gastroduodenal artery at the inferior extent of the coils previously placed. A micro catheter was then advanced into the inferior pancreaticoduodenal artery. And angiography was performed. There are 2 branches leading to the area of extravasation. The micro catheter was successfully placed into the inferior branch and interlock coils were deployed. A 2 mm by 2 cm coil was deployed. Subsequently a 2 mm x 4 cm coil was deployed. A third 2 mm by 2 cm coil was deployed. Cannulation of the second branch was unsuccessful due to the tortuous course. Final angiogram through the SMA demonstrates persistent contrast extravasation. The Cobra catheter was then exchanged for a sauce Omni catheter. The celiac axis was selected and angiography  was performed. Injection of the celiac demonstrated no evidence of contrast extravasation. A micro catheter was then advanced through the celiac axis and into the common hepatic artery. Repeat angiography demonstrates no extravasation of contrast. The sauce Omni catheter was retracted to the right external iliac artery and femoral angiography was performed. The sheath was removed and hemostasis was achieved with an Exoseal device. FINDINGS: Angiography of the SMA demonstrates abrupt contrast extravasation from the gastroduodenal artery at the inferior extent of the coils. There are 2 branches leading to the gastroduodenal artery via the pancreaticoduodenal branches. One of these was successfully embolized with coils and repeat angiogram confirms occlusion. The other branch could not be reached or embolized and final angiogram confirms patency of this vessel with persistent bleeding. IMPRESSION: The diagnostic angiogram confirms persistent extravasation and active bleeding into the duodenum via the gastroduodenal artery at the inferior extent of the coils. Two branches of the Fahrenheit deep pancreaticoduodenal artery were identified as feeding vessels. One of these was successfully embolized. The other could not be embolized and persistent bleeding at the end of the procedure was noted. Injection of the celiac axis demonstrates no evidence of recurrent gastrointestinal bleeding. Electronically Signed   By: Jolaine Click M.D.   On: 06/06/2015 17:21   Dg Chest Port 1 View  06/07/2015  CLINICAL DATA:  82 year old  female with a history of questionable finding on prior study. Endotracheal tube. Central line. EXAM: PORTABLE CHEST 1 VIEW COMPARISON:  06/06/2015, 06/03/2015 FINDINGS: Cardiomediastinal silhouette unchanged. Atherosclerotic calcification of the aortic arch. 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 Endotracheal tube remains in position terminating approximately 3.5 cm above the carina. Unchanged  right IJ approach central venous catheter appearing to terminate in the superior vena cava. Gastric tube unchanged terminates in the left upper quadrant. Low lung volumes with mixed interstitial and airspace opacity at the right base partially obscuring the right heart border on the right hemidiaphragm. Retrocardiac region not well visualized with blunting of left costophrenic angle. No pneumothorax. IMPRESSION: Endotracheal tube terminates suitably above the carina. Otherwise unchanged support apparatus. Similar appearance of basilar, right greater than left interstitial and airspace opacities, potentially atelectasis, edema, and/ or consolidation. Small pleural effusions not excluded. Signed, Yvone Neu. Loreta Ave, DO [facility] Electronically Signed   By: Gilmer Mor D.O.   On: 06/07/2015 07:06   Dg Chest Port 1 View  06/06/2015  CLINICAL DATA:  82 year old with bleeding duodenal ulcer for which the patient has undergone transcatheter therapy, ventilator dependent respiratory failure. EXAM: PORTABLE CHEST 1 VIEW COMPARISON:  06/02/2015 and earlier. FINDINGS: Endotracheal tube tip in satisfactory position projecting approximately 2 cm above the carina. Right jugular central venous catheter tip projects over the lower SVC. Nasogastric tube courses below the diaphragm into the stomach. Interval worsening of atelectasis in the lung bases, with dense atelectasis at the left lung base. Lungs otherwise clear. Pulmonary vascularity normal. Prior sternotomy CABG with stable mild cardiomegaly. IMPRESSION: 1. Support apparatus satisfactory. 2. Worsening atelectasis in the lung bases, left greater than right, since the examination 4 days ago. 3. Stable mild cardiomegaly without pulmonary edema. Electronically Signed   By: Hulan Saas M.D.   On: 06/06/2015 18:16    STUDIES:  X ray rib 4/2: No fractures Port CXR 4/2:  Questionable right basal hazy opacity. R  IJ CVL in mid-SVC. Chronic elevation left hemidiaphragm.  R Knee 2View 4/2: No fracture or dislocation. Port CXR 4/4:  Silhouetting of left hemidiaphragm unchanged. Endotracheal tube and central venous catheter in good position. No new focal opacity. TTE 4/4: LV normal in size with EF 45-50%. Akinesis of anterior septal myocardium. Grade 2 diastolic dysfunction. LA & RA normal in size. RV normal in size and function. PASP . Moderate aortic stenosis without regurgitation. Severe mitral vegetation without stenosis. Trivial pulmonic regurgitation without stenosis. Moderate tricuspid regurgitation. Port CXR 4/8: Endotracheal tube and central line in good position. Low lung volumes. Hazy opacification bilaterally.  CULTURES: Blood Ctx x2 4/5:  Enterococcus 1/2 bottles Blood Ctx x2 4/2>>> Enterococcus 1/2 bottles Urine Ctx x2 4/2>>> Enterococcus 2/2 cultures MRSA PCR 4/2:  Negative   ANTIBIOTICS: Ampicillin 4/5>>> Vancomycin 4/3 - 4/5 Rocephin 4/3 - 4/5 Unasyn 4/2  SIGNIFICANT EVENTS: 4/2 - Admit 4/3 - Intubation & EGD 4/4 - Extubation 4/6 - Successful GDA microcoil embo per IR 4/7 - Repeat bleeding>>taken to IR but unsuccessful>>taken to OR & oversewen  LINES/TUBES: OETT 4/3 - 4/4; 7.5 4/7>>> R Abd Drain 4/7>>> OGT 4/7>>> R IJ CVL 4/2>> Foley 4/2>> PIV x1   ASSESSMENT / PLAN:  PULMONARY A: Possible RLL Pneumonia Acute Hypoxic Respiratory Failure - Likely secondary to anemia & IV blood/fluid resuscitation. Intubated for EGD 4/3 then extubated 4/4. Intubated for OR on 4/7.  P:   Full Vent Support  Plan for SBT in AM Plan to  remain intubated for TEE  CARDIOVASCULAR A:  Shock - Intermittent. Sepsis vs GIB. Moderate Aortic Stenosis / Severe Mitral Regurg - Seen on TTE 4/4. Demand Ischemia 2/2 anemia NSTEMI - Possible MI given wall motion abnormality on TTE 4/4. Atrial Fibrillation - Intermittent. H/O HTN H/O Hyperlipdemia H/O Bioprosthetic Valve - Porcine?  Position?  P:  Continue Weaning Neosynephrine to maintain MAP>65 No systemic anticoagulation given GIB Monitor on telemetry Vitals per unit protocol Holding Lisinopril & Lopressor Plan for Cardiology Consult if patient stabilizes TEE for possible endocarditis pending  RENAL A:   Acute Renal Failure - Improving. Hypomagnesemia - Resolved.  Hypokalemia - Resolved. Hypernatremia - Mild.  P:   1/2 NS MIVF @ 75cc/hr Trending UOP with Foley Monitoring daily renal function & electrolytes Replacing electrolytes as indicated  GASTROINTESTINAL A:   Acute GIB - Secondary to Duodenal Ulcer on EGD 4/3. Failed embolization 4/6. OR for surgery 4/7.  P:   General Surgery Following Continue Protonix gtt NPO  HEMATOLOGIC A:   Anemia - Secondary to GIB. S/P multiple PRBC & FFP. Thrombocytopenia - Mild. Likely consumption.  Coagulopathy - Dilution vs Consumption.  P:  Transfusing 1u Platelets this morning Trending cell counts q8hr Trending INR q8hr Goal Hgb >8.0 w/ MI SCDs  INFECTIOUS A:   Sepsis - Secondary to UTI & Bacteremia Enterococcus Bacteremia - Persistent. Enterococcus UTI  P:   ID Following & appreciate recommendations Repeat Cultures persistently positive Ampicillin Day #7  Procalcitonin per algorithm Checking TEE while intubated  ENDOCRINE A:   Questionable Adrenal Insufficiency H/O Hypothyroidism  P:   Synthroid IV daily Accu-Checks q4hr SSI per algorithm Holding on steroid therapy at this time given duodenal ulcer  NEUROLOGIC A:   Pain in Right Knee - No fracture of X-ray. H/O Depression  P:   Goal RASS:  -1 to -2 Propofol gtt Fentanyl IV prn Holding Wellbutrin XL & Cymbalta Holding pain medication   FAMILY UPDATES:  Husband updated at bedside by Dr. Margart Sickles & Dr. Jamison Neighbor on 4/7.  TODAY'S SUMMARY:  An 33 year old status post fall with non-STEMI, bleeding duodenal ulcer, & persistent enterococcus bacteremia with bioprosthetic valve.  Continuing broad-spectrum antibiotics. Status post failed embolization with interventional radiology and now status post surgical intervention for bleeding ulcer. Continuing Protonix drip. Checking transesophageal echocardiogram to evaluate the patient's bowels for possible endocarditis while she remains intubated.  I have spent a total of 32 minutes of critical care time today caring for the patient and reviewing the patient's electronic medical record.  Donna Christen Jamison Neighbor, M.D. [facility] Pulmonary [facility] Critical [facility] Pager:  [phone] After 3pm or if no response, call [phone] 7:17 AM 06/07/2015

## 2015-06-07 NOTE — Progress Notes (Signed)
eLink Physician-Brief Progress Note Patient Name: Jasmine Newman DOB: [birth year] MRN: [id number]   Date of Service  06/07/2015  HPI/Events of Note  Hgb drop form 7.5 to 6.4  eICU Interventions  Plan: Transfuse 1 unit pRBC Post-transfusion CBC     Intervention Category Intermediate Interventions: Bleeding - evaluation and treatment with blood products  DETERDING,ELIZABETH 06/07/2015, 2:59 AM

## 2015-06-07 NOTE — Progress Notes (Signed)
Pt hypotensive with BP 93/35 (53). No signs of bleeding. Dr. Darrick Pennaeterding notified. Orders for 500cc NS bolus given and administered. After bolus, BP 111/44 (66). Will continue to monitor.

## 2015-06-07 NOTE — Progress Notes (Signed)
eLink Physician-Brief Progress Note Patient Name: Jasmine PonsBetty G Kerth DOB: 04-09-1931 MRN: [id number]   Date of Service  06/07/2015  HPI/Events of Note  Hypotension with MAP of 50.  Had been on NEO prior to transfer from OR to ICU.  CVP earlier was 7.  Has received no additional sedation.  eICU Interventions  Plan: 500 cc NS bolus for BP support     Intervention Category Intermediate Interventions: Hypotension - evaluation and management  Elray Dains 06/07/2015, 12:50 AM

## 2015-06-07 NOTE — Progress Notes (Signed)
CRITICAL VALUE ALERT  Critical value received:  hbg 6.4  Date of notification:  06/07/2015  Time of notification:  0250  Critical value read back:Yes.    Nurse who received alert:  Loletta ParishJessica Juriel Cid RN  MD notified (1st page):  Dr. Darrick Pennaeterding   Time of first page:  0300  MD notified (2nd page):  Time of second page:  Responding MD:  Dr. Darrick Pennaeterding  Time MD responded:  0300

## 2015-06-07 NOTE — Progress Notes (Signed)
1 Day Post-Op  Subjective: Awake on vent.  Following commands.  Objective: Vital signs in last 24 hours: Temp:  [94.1 F (34.5 C)-98.2 F (36.8 C)] 97.3 F (36.3 C) (04/08 0800) Pulse Rate:  [58-119] 80 (04/08 0800) Resp:  [9-28] 17 (04/08 0800) BP: (69-130)/(33-84) 113/56 mmHg (04/08 0800) SpO2:  [84 %-100 %] 100 % (04/08 0800) Arterial Line BP: (105-119)/(68-79) 107/68 mmHg (04/07 2000) FiO2 (%):  [40 %-50 %] 40 % (04/08 0757) Weight:  [103.9 kg (229 lb 0.9 oz)] 103.9 kg (229 lb 0.9 oz) (04/08 0500) Last BM Date: 06/06/15  Intake/Output from previous day: 04/07 0701 - 04/08 0700 In: 6149.3 [I.V.:3463.3; Blood:1916; IV Piggyback:650] Out: 2060 [Urine:1770; Drains:190; Blood:100] Intake/Output this shift: Total I/O In: 156 [I.V.:106; IV Piggyback:50] Out: 115 [Urine:100; Drains:15]  PE: General- In NAD Abdomen-soft, thin serosanguinous drain output, dressing dry  Lab Results:   Recent Labs  06/07/15 0200 06/07/15 0700  WBC 9.6 10.2  HGB 6.4* 7.5*  HCT 18.1* 21.7*  PLT 97* 95*   BMET  Recent Labs  06/06/15 1623 06/06/15 1647 06/07/15 0200  NA 146* 145 149*  148*  K 4.0 3.9 3.6  3.5  CL 119*  --  118*  118*  CO2 21*  --  24  24  GLUCOSE 132*  --  108*  107*  BUN 13  --  12  13  CREATININE 1.28*  --  1.27*  1.32*  CALCIUM 7.0*  --  7.8*  7.7*   PT/INR  Recent Labs  06/06/15 1942 06/07/15 0200  LABPROT 17.5* 17.5*  INR 1.48 1.48   Comprehensive Metabolic Panel:    Component Value Date/Time   NA 149* 06/07/2015 0200   NA 148* 06/07/2015 0200   K 3.6 06/07/2015 0200   K 3.5 06/07/2015 0200   CL 118* 06/07/2015 0200   CL 118* 06/07/2015 0200   CO2 24 06/07/2015 0200   CO2 24 06/07/2015 0200   BUN 12 06/07/2015 0200   BUN 13 06/07/2015 0200   CREATININE 1.27* 06/07/2015 0200   CREATININE 1.32* 06/07/2015 0200   CREATININE 1.14* 06/28/2011 1328   GLUCOSE 108* 06/07/2015 0200   GLUCOSE 107* 06/07/2015 0200   CALCIUM 7.8* 06/07/2015  0200   CALCIUM 7.7* 06/07/2015 0200   AST 18 06/06/2015 1623   AST 25 06/01/2015 1125   ALT 16 06/06/2015 1623   ALT 19 06/01/2015 1125   ALKPHOS 35* 06/06/2015 1623   ALKPHOS 64 06/01/2015 1125   BILITOT 0.5 06/06/2015 1623   BILITOT 0.5 06/01/2015 1125   PROT 3.4* 06/06/2015 1623   PROT 6.1* 06/01/2015 1125   ALBUMIN 2.1* 06/07/2015 0200   ALBUMIN 1.7* 06/06/2015 1623     Studies/Results: Ir Angiogram Visceral Selective  06/06/2015  INDICATION: Gastrointestinal bleeding EXAM: SELECTIVE VISCERAL ARTERIOGRAPHY; ADDITIONAL ARTERIOGRAPHY; IR ULTRASOUND GUIDANCE VASC ACCESS RIGHT MEDICATIONS: . The antibiotic was administered within 1 hour of the procedure ANESTHESIA/SEDATION: Moderate (conscious) sedation was employed during this procedure. A total of Versed 0 mg and Fentanyl 0 mcg was administered intravenously. Moderate Sedation Time: minutes. The patient's level of consciousness and vital signs were monitored continuously by radiology nursing throughout the procedure under my direct supervision. CONTRAST:  100 cc Omnipaque 300 FLUOROSCOPY TIME:  Fluoroscopy Time: 31 minutes 12 seconds (1469 mGy). COMPLICATIONS: None immediate. PROCEDURE: Informed consent was obtained from the patient following explanation of the procedure, risks, benefits and alternatives. The patient understands, agrees and consents for the procedure. All questions were addressed. A time out  was performed prior to the initiation of the procedure. Maximal barrier sterile technique utilized including caps, mask, sterile gowns, sterile gloves, large sterile drape, hand hygiene, and Betadine prep. Right groin was prepped and draped in a sterile fashion. Under sonographic guidance, a micropuncture needle was inserted into the right common femoral artery and removed over a 018 wire which was up sized to a Bentson. A 5 French sheath was inserted. The SMA was selected. Angiography was performed. This showed abrupt extravasation of  contrast from the gastroduodenal artery at the inferior extent of the coils previously placed. A micro catheter was then advanced into the inferior pancreaticoduodenal artery. And angiography was performed. There are 2 branches leading to the area of extravasation. The micro catheter was successfully placed into the inferior branch and interlock coils were deployed. A 2 mm by 2 cm coil was deployed. Subsequently a 2 mm x 4 cm coil was deployed. A third 2 mm by 2 cm coil was deployed. Cannulation of the second branch was unsuccessful due to the tortuous course. Final angiogram through the SMA demonstrates persistent contrast extravasation. The Cobra catheter was then exchanged for a sauce Omni catheter. The celiac axis was selected and angiography was performed. Injection of the celiac demonstrated no evidence of contrast extravasation. A micro catheter was then advanced through the celiac axis and into the common hepatic artery. Repeat angiography demonstrates no extravasation of contrast. The sauce Omni catheter was retracted to the right external iliac artery and femoral angiography was performed. The sheath was removed and hemostasis was achieved with an Exoseal device. FINDINGS: Angiography of the SMA demonstrates abrupt contrast extravasation from the gastroduodenal artery at the inferior extent of the coils. There are 2 branches leading to the gastroduodenal artery via the pancreaticoduodenal branches. One of these was successfully embolized with coils and repeat angiogram confirms occlusion. The other branch could not be reached or embolized and final angiogram confirms patency of this vessel with persistent bleeding. IMPRESSION: The diagnostic angiogram confirms persistent extravasation and active bleeding into the duodenum via the gastroduodenal artery at the inferior extent of the coils. Two branches of the Fahrenheit deep pancreaticoduodenal artery were identified as feeding vessels. One of these was  successfully embolized. The other could not be embolized and persistent bleeding at the end of the procedure was noted. Injection of the celiac axis demonstrates no evidence of recurrent gastrointestinal bleeding. Electronically Signed   By: Jolaine Click M.D.   On: 06/06/2015 17:21   Ir Angiogram Visceral Selective  06/06/2015  INDICATION: Gastrointestinal bleeding EXAM: SELECTIVE VISCERAL ARTERIOGRAPHY; ADDITIONAL ARTERIOGRAPHY; IR ULTRASOUND GUIDANCE VASC ACCESS RIGHT MEDICATIONS: . The antibiotic was administered within 1 hour of the procedure ANESTHESIA/SEDATION: Moderate (conscious) sedation was employed during this procedure. A total of Versed 0 mg and Fentanyl 0 mcg was administered intravenously. Moderate Sedation Time: minutes. The patient's level of consciousness and vital signs were monitored continuously by radiology nursing throughout the procedure under my direct supervision. CONTRAST:  100 cc Omnipaque 300 FLUOROSCOPY TIME:  Fluoroscopy Time: 31 minutes 12 seconds (1469 mGy). COMPLICATIONS: None immediate. PROCEDURE: Informed consent was obtained from the patient following explanation of the procedure, risks, benefits and alternatives. The patient understands, agrees and consents for the procedure. All questions were addressed. A time out was performed prior to the initiation of the procedure. Maximal barrier sterile technique utilized including caps, mask, sterile gowns, sterile gloves, large sterile drape, hand hygiene, and Betadine prep. Right groin was prepped and draped in a sterile fashion. Under  sonographic guidance, a micropuncture needle was inserted into the right common femoral artery and removed over a 018 wire which was up sized to a Bentson. A 5 French sheath was inserted. The SMA was selected. Angiography was performed. This showed abrupt extravasation of contrast from the gastroduodenal artery at the inferior extent of the coils previously placed. A micro catheter was then advanced  into the inferior pancreaticoduodenal artery. And angiography was performed. There are 2 branches leading to the area of extravasation. The micro catheter was successfully placed into the inferior branch and interlock coils were deployed. A 2 mm by 2 cm coil was deployed. Subsequently a 2 mm x 4 cm coil was deployed. A third 2 mm by 2 cm coil was deployed. Cannulation of the second branch was unsuccessful due to the tortuous course. Final angiogram through the SMA demonstrates persistent contrast extravasation. The Cobra catheter was then exchanged for a sauce Omni catheter. The celiac axis was selected and angiography was performed. Injection of the celiac demonstrated no evidence of contrast extravasation. A micro catheter was then advanced through the celiac axis and into the common hepatic artery. Repeat angiography demonstrates no extravasation of contrast. The sauce Omni catheter was retracted to the right external iliac artery and femoral angiography was performed. The sheath was removed and hemostasis was achieved with an Exoseal device. FINDINGS: Angiography of the SMA demonstrates abrupt contrast extravasation from the gastroduodenal artery at the inferior extent of the coils. There are 2 branches leading to the gastroduodenal artery via the pancreaticoduodenal branches. One of these was successfully embolized with coils and repeat angiogram confirms occlusion. The other branch could not be reached or embolized and final angiogram confirms patency of this vessel with persistent bleeding. IMPRESSION: The diagnostic angiogram confirms persistent extravasation and active bleeding into the duodenum via the gastroduodenal artery at the inferior extent of the coils. Two branches of the Fahrenheit deep pancreaticoduodenal artery were identified as feeding vessels. One of these was successfully embolized. The other could not be embolized and persistent bleeding at the end of the procedure was noted. Injection of  the celiac axis demonstrates no evidence of recurrent gastrointestinal bleeding. Electronically Signed   By: Jolaine Click M.D.   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 ANESTHESIA/SEDATION: 50 MCG FENTANYL ONLY Moderate Sedation Time: NONE. The patient's level of consciousness and vital signs were monitored continuously by radiology nursing throughout the procedure under my direct supervision. CONTRAST:  50mL ISOVUE-300 IOPAMIDOL (ISOVUE-300) INJECTION 61% FLUOROSCOPY TIME:  Fluoroscopy Time: 12 minutes 18 seconds (947 mGy). COMPLICATIONS: None immediate. PROCEDURE: Informed consent was obtained from the patient following explanation of the procedure, risks, benefits and alternatives. The patient understands, agrees and consents for the procedure. All questions were addressed. A time out was performed prior to the initiation of the procedure. Maximal barrier sterile technique utilized including caps, mask, sterile gowns, sterile gloves, large sterile drape, hand hygiene, and Betadine prep. Under sterile conditions and local anesthesia, ultrasound micropuncture access performed of the patent right common femoral artery. Five French sheath inserted over a guidewire. Five French C2 catheter advanced over a Bentson guidewire to the celiac artery. Selective celiac angiogram performed. Celiac: Celiac is widely patent. Left gastric, splenic and hepatic vasculature patent. GDA is  patent. There is active extravasation from the gastroduodenal artery localized to the level of the duodenum compatible with a bleeding duodenal ulcer. Renegade STC micro catheter and GT micro Glidewire were advanced through the C2 catheter into the  gastroduodenal artery. Selective gastroduodenal angiogram performed. Gastroduodenal artery: Gastroduodenal artery is patent. Active bleeding present from a small distal branch of the gastroduodenal artery compatible with an active bleeding duodenal ulcer. Embolization: Micro catheter was advanced over the catheter and guidewire to the level of the gastroduodenal artery bleeding site. Repeat injection confirms micro catheter position at the site of active bleeding. Micro coil embolization performed of the gastroduodenal artery with insertion of 3 3 mm x 6 mm interlock coils, 1 4 mm x 8 mm soft coil, 1 4 mm x 12 mm soft coil, and 1 5 mm x 8 mm soft coil. Post embolization angiogram demonstrates occlusion of the gastroduodenal artery with no further active bleeding. Micro catheter was retracted into the common hepatic artery. Common hepatic artery angiogram performed. Common hepatic: This confirms preserved patency of the common, proper, left and right hepatic arteries. Access removed. Hemostasis obtained with manual compression. No immediate complication. Patient tolerated the procedure well. 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 Successful micro coil embolization of the gastroduodenal artery to complete occlusion with no further active bleeding. Electronically Signed   By: Judie PetitM.  Shick M.D.   On: 06/05/2015 13:27   Ir Angiogram Selective Each Additional Vessel  06/06/2015  INDICATION: Gastrointestinal bleeding EXAM: SELECTIVE VISCERAL ARTERIOGRAPHY; ADDITIONAL ARTERIOGRAPHY; IR ULTRASOUND GUIDANCE VASC ACCESS RIGHT MEDICATIONS: . The antibiotic was administered within 1 hour of the procedure ANESTHESIA/SEDATION: Moderate (conscious) sedation was employed during this procedure. A total of Versed 0 mg and Fentanyl 0 mcg was administered intravenously. Moderate Sedation Time: minutes. The patient's level of  consciousness and vital signs were monitored continuously by radiology nursing throughout the procedure under my direct supervision. CONTRAST:  100 cc Omnipaque 300 FLUOROSCOPY TIME:  Fluoroscopy Time: 31 minutes 12 seconds (1469 mGy). COMPLICATIONS: None immediate. PROCEDURE: Informed consent was obtained from the patient following explanation of the procedure, risks, benefits and alternatives. The patient understands, agrees and consents for the procedure. All questions were addressed. A time out was performed prior to the initiation of the procedure. Maximal barrier sterile technique utilized including caps, mask, sterile gowns, sterile gloves, large sterile drape, hand hygiene, and Betadine prep. Right groin was prepped and draped in a sterile fashion. Under sonographic guidance, a micropuncture needle was inserted into the right common femoral artery and removed over a 018 wire which was up sized to a Bentson. A 5 French sheath was inserted. The SMA was selected. Angiography was performed. This showed abrupt extravasation of contrast from the gastroduodenal artery at the inferior extent of the coils previously placed. A micro catheter was then advanced into the inferior pancreaticoduodenal artery. And angiography was performed. There are 2 branches leading to the area of extravasation. The micro catheter was successfully placed into the inferior branch and interlock coils were deployed. A 2 mm by 2 cm coil was deployed. Subsequently a 2 mm x 4 cm coil was deployed. A third 2 mm by 2 cm coil was deployed. Cannulation of the second branch was unsuccessful due to the tortuous course. Final angiogram through the SMA demonstrates persistent contrast extravasation. The Cobra catheter was then exchanged for a sauce Omni catheter. The celiac axis was selected and angiography was  performed. Injection of the celiac demonstrated no evidence of contrast extravasation. A micro catheter was then advanced through the celiac  axis and into the common hepatic artery. Repeat angiography demonstrates no extravasation of contrast. The sauce Omni catheter was retracted to the right external iliac artery and femoral angiography was performed. The sheath was removed and hemostasis was achieved with an Exoseal device. FINDINGS: Angiography of the SMA demonstrates abrupt contrast extravasation from the gastroduodenal artery at the inferior extent of the coils. There are 2 branches leading to the gastroduodenal artery via the pancreaticoduodenal branches. One of these was successfully embolized with coils and repeat angiogram confirms occlusion. The other branch could not be reached or embolized and final angiogram confirms patency of this vessel with persistent bleeding. IMPRESSION: The diagnostic angiogram confirms persistent extravasation and active bleeding into the duodenum via the gastroduodenal artery at the inferior extent of the coils. Two branches of the Fahrenheit deep pancreaticoduodenal artery were identified as feeding vessels. One of these was successfully embolized. The other could not be embolized and persistent bleeding at the end of the procedure was noted. Injection of the celiac axis demonstrates no evidence of recurrent gastrointestinal bleeding. Electronically Signed   By: Jolaine Click M.D.   On: 06/06/2015 17:21   Ir Angiogram Selective Each Additional Vessel  06/06/2015  INDICATION: Gastrointestinal bleeding EXAM: SELECTIVE VISCERAL ARTERIOGRAPHY; ADDITIONAL ARTERIOGRAPHY; IR ULTRASOUND GUIDANCE VASC ACCESS RIGHT MEDICATIONS: . The antibiotic was administered within 1 hour of the procedure ANESTHESIA/SEDATION: Moderate (conscious) sedation was employed during this procedure. A total of Versed 0 mg and Fentanyl 0 mcg was administered intravenously. Moderate Sedation Time: minutes. The patient's level of consciousness and vital signs were monitored continuously by radiology nursing throughout the procedure under my direct  supervision. CONTRAST:  100 cc Omnipaque 300 FLUOROSCOPY TIME:  Fluoroscopy Time: 31 minutes 12 seconds (1469 mGy). COMPLICATIONS: None immediate. PROCEDURE: Informed consent was obtained from the patient following explanation of the procedure, risks, benefits and alternatives. The patient understands, agrees and consents for the procedure. All questions were addressed. A time out was performed prior to the initiation of the procedure. Maximal barrier sterile technique utilized including caps, mask, sterile gowns, sterile gloves, large sterile drape, hand hygiene, and Betadine prep. Right groin was prepped and draped in a sterile fashion. Under sonographic guidance, a micropuncture needle was inserted into the right common femoral artery and removed over a 018 wire which was up sized to a Bentson. A 5 French sheath was inserted. The SMA was selected. Angiography was performed. This showed abrupt extravasation of contrast from the gastroduodenal artery at the inferior extent of the coils previously placed. A micro catheter was then advanced into the inferior pancreaticoduodenal artery. And angiography was performed. There are 2 branches leading to the area of extravasation. The micro catheter was successfully placed into the inferior branch and interlock coils were deployed. A 2 mm by 2 cm coil was deployed. Subsequently a 2 mm x 4 cm coil was deployed. A third 2 mm by 2 cm coil was deployed. Cannulation of the second branch was unsuccessful due to the tortuous course. Final angiogram through the SMA demonstrates persistent contrast extravasation. The Cobra catheter was then exchanged for a sauce Omni catheter. The celiac axis was selected and angiography was performed. Injection of the celiac demonstrated no evidence of contrast extravasation. A micro catheter was then advanced through the celiac axis and into the common hepatic artery. Repeat angiography demonstrates no extravasation of contrast. The sauce Omni  catheter was retracted to the right external iliac artery and femoral angiography was performed. The sheath was removed and hemostasis was achieved with an Exoseal device. FINDINGS: Angiography of the SMA demonstrates abrupt contrast extravasation from the gastroduodenal artery at the inferior extent of the coils. There are 2 branches leading to the gastroduodenal artery via the pancreaticoduodenal branches. One of these was successfully embolized with coils and repeat angiogram confirms occlusion. The other branch could not be reached or embolized and final angiogram confirms patency of this vessel with persistent bleeding. IMPRESSION: The diagnostic angiogram confirms persistent extravasation and active bleeding into the duodenum via the gastroduodenal artery at the inferior extent of the coils. Two branches of the Fahrenheit deep pancreaticoduodenal artery were identified as feeding vessels. One of these was successfully embolized. The other could not be embolized and persistent bleeding at the end of the procedure was noted. Injection of the celiac axis demonstrates no evidence of recurrent gastrointestinal bleeding. Electronically Signed   By: Jolaine Click M.D.   On: 06/06/2015 17:21   Ir Angiogram Selective Each Additional Vessel  06/06/2015  INDICATION: Gastrointestinal bleeding EXAM: SELECTIVE VISCERAL ARTERIOGRAPHY; ADDITIONAL ARTERIOGRAPHY; IR ULTRASOUND GUIDANCE VASC ACCESS RIGHT MEDICATIONS: . The antibiotic was administered within 1 hour of the procedure ANESTHESIA/SEDATION: Moderate (conscious) sedation was employed during this procedure. A total of Versed 0 mg and Fentanyl 0 mcg was administered intravenously. Moderate Sedation Time: minutes. The patient's level of consciousness and vital signs were monitored continuously by radiology nursing throughout the procedure under my direct supervision. CONTRAST:  100 cc Omnipaque 300 FLUOROSCOPY TIME:  Fluoroscopy Time: 31 minutes 12 seconds (1469 mGy).  COMPLICATIONS: None immediate. PROCEDURE: Informed consent was obtained from the patient following explanation of the procedure, risks, benefits and alternatives. The patient understands, agrees and consents for the procedure. All questions were addressed. A time out was performed prior to the initiation of the procedure. Maximal barrier sterile technique utilized including caps, mask, sterile gowns, sterile gloves, large sterile drape, hand hygiene, and Betadine prep. Right groin was prepped and draped in a sterile fashion. Under sonographic guidance, a micropuncture needle was inserted into the right common femoral artery and removed over a 018 wire which was up sized to a Bentson. A 5 French sheath was inserted. The SMA was selected. Angiography was performed. This showed abrupt extravasation of contrast from the gastroduodenal artery at the inferior extent of the coils previously placed. A micro catheter was then advanced into the inferior pancreaticoduodenal artery. And angiography was performed. There are 2 branches leading to the area of extravasation. The micro catheter was successfully placed into the inferior branch and interlock coils were deployed. A 2 mm by 2 cm coil was deployed. Subsequently a 2 mm x 4 cm coil was deployed. A third 2 mm by 2 cm coil was deployed. Cannulation of the second branch was unsuccessful due to the tortuous course. Final angiogram through the SMA demonstrates persistent contrast extravasation. The Cobra catheter was then exchanged for a sauce Omni catheter. The celiac axis was selected and angiography was performed. Injection of the celiac demonstrated no evidence of contrast extravasation. A micro catheter was then advanced through the celiac axis and into the common hepatic artery. Repeat angiography demonstrates no extravasation of contrast. The sauce Omni catheter was retracted to the right external iliac artery and femoral angiography was performed. The sheath was removed  and hemostasis was achieved with an Exoseal device. FINDINGS: Angiography of the SMA demonstrates abrupt contrast extravasation from the gastroduodenal  artery at the inferior extent of the coils. There are 2 branches leading to the gastroduodenal artery via the pancreaticoduodenal branches. One of these was successfully embolized with coils and repeat angiogram confirms occlusion. The other branch could not be reached or embolized and final angiogram confirms patency of this vessel with persistent bleeding. IMPRESSION: The diagnostic angiogram confirms persistent extravasation and active bleeding into the duodenum via the gastroduodenal artery at the inferior extent of the coils. Two branches of the Fahrenheit deep pancreaticoduodenal artery were identified as feeding vessels. One of these was successfully embolized. The other could not be embolized and persistent bleeding at the end of the procedure was noted. Injection of the celiac axis demonstrates no evidence of recurrent gastrointestinal bleeding. Electronically Signed   By: Jolaine Click M.D.   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 ANESTHESIA/SEDATION: 50 MCG FENTANYL ONLY Moderate Sedation Time: NONE. The patient's level of consciousness and vital signs were monitored continuously by radiology nursing throughout the procedure under my direct supervision. CONTRAST:  50mL ISOVUE-300 IOPAMIDOL (ISOVUE-300) INJECTION 61% FLUOROSCOPY TIME:  Fluoroscopy Time: 12 minutes 18 seconds (947 mGy). COMPLICATIONS: None immediate. PROCEDURE: Informed consent was obtained from the patient following explanation of the procedure, risks, benefits and alternatives.  The patient understands, agrees and consents for the procedure. All questions were addressed. A time out was performed prior to the initiation of the procedure. Maximal barrier sterile technique utilized including caps, mask, sterile gowns, sterile gloves, large sterile drape, hand hygiene, and Betadine prep. Under sterile conditions and local anesthesia, ultrasound micropuncture access performed of the patent right common femoral artery. Five French sheath inserted over a guidewire. Five French C2 catheter advanced over a Bentson guidewire to the celiac artery. Selective celiac angiogram performed. Celiac: Celiac is widely patent. Left gastric, splenic and hepatic vasculature patent. GDA is patent. There is active extravasation from the gastroduodenal artery localized to the level of the duodenum compatible with a bleeding duodenal ulcer. Renegade STC micro catheter and GT micro Glidewire were advanced through the C2 catheter into the gastroduodenal artery. Selective gastroduodenal angiogram performed. Gastroduodenal artery: Gastroduodenal artery is patent. Active bleeding present from a small distal branch of the gastroduodenal artery compatible with an active bleeding duodenal ulcer. Embolization: Micro catheter was advanced over the catheter and guidewire to the level of the gastroduodenal artery bleeding site. Repeat injection confirms micro catheter position at the site of active bleeding. Micro coil embolization performed of the gastroduodenal artery with insertion of 3 3 mm x 6 mm interlock coils, 1 4 mm x 8 mm soft coil, 1 4 mm x 12 mm soft coil, and 1 5 mm x 8 mm soft coil. Post embolization angiogram demonstrates occlusion of the gastroduodenal artery with no further active bleeding. Micro catheter was retracted into the common hepatic artery. Common hepatic artery angiogram performed. Common hepatic: This confirms preserved patency of the common, proper, left and right hepatic arteries. Access removed.  Hemostasis obtained with manual compression. No immediate complication. Patient tolerated the procedure well. 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 Successful micro coil embolization of the gastroduodenal artery to complete occlusion with no further active bleeding. Electronically  Signed   By: Judie Petit.  Shick M.D.   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 ANESTHESIA/SEDATION: 50 MCG FENTANYL ONLY Moderate Sedation Time: NONE. The patient's level of consciousness and vital signs were monitored continuously by radiology nursing throughout the procedure under my direct supervision. CONTRAST:  50mL ISOVUE-300 IOPAMIDOL (ISOVUE-300) INJECTION 61% FLUOROSCOPY TIME:  Fluoroscopy Time: 12 minutes 18 seconds (947 mGy). COMPLICATIONS: None immediate. PROCEDURE: Informed consent was obtained from the patient following explanation of the procedure, risks, benefits and alternatives. The patient understands, agrees and consents for the procedure. All questions were addressed. A time out was performed prior to the initiation of the procedure. Maximal barrier sterile technique utilized including caps, mask, sterile gowns, sterile gloves, large sterile drape, hand hygiene, and Betadine prep. Under sterile conditions and local anesthesia, ultrasound micropuncture access performed of the patent right common femoral artery. Five French sheath inserted over a guidewire. Five French C2 catheter advanced over a Bentson guidewire to the celiac artery. Selective celiac angiogram performed. Celiac: Celiac is widely patent.  Left gastric, splenic and hepatic vasculature patent. GDA is patent. There is active extravasation from the gastroduodenal artery localized to the level of the duodenum compatible with a bleeding duodenal ulcer. Renegade STC micro catheter and GT micro Glidewire were advanced through the C2 catheter into the gastroduodenal artery. Selective gastroduodenal angiogram performed. Gastroduodenal artery: Gastroduodenal artery is patent. Active bleeding present from a small distal branch of the gastroduodenal artery compatible with an active bleeding duodenal ulcer. Embolization: Micro catheter was advanced over the catheter and guidewire to the level of the gastroduodenal artery bleeding site. Repeat injection confirms micro catheter position at the site of active bleeding. Micro coil embolization performed of the gastroduodenal artery with insertion of 3 3 mm x 6 mm interlock coils, 1 4 mm x 8 mm soft coil, 1 4 mm x 12 mm soft coil, and 1 5 mm x 8 mm soft coil. Post embolization angiogram demonstrates occlusion of the gastroduodenal artery with no further active bleeding. Micro catheter was retracted into the common hepatic artery. Common hepatic artery angiogram performed. Common hepatic: This confirms preserved patency of the common, proper, left and right hepatic arteries. Access removed. Hemostasis obtained with manual compression. No immediate complication. Patient tolerated the procedure well. 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 Successful micro coil embolization of the gastroduodenal artery to complete occlusion with no further active bleeding. Electronically Signed   By: Judie Petit.  Shick M.D.   On: 06/05/2015 13:27   Ir US Guide Vasc Access Right  06/06/2015  INDICATION: Gastrointestinal bleeding EXAM: SELECTIVE VISCERAL ARTERIOGRAPHY; ADDITIONAL ARTERIOGRAPHY; IR ULTRASOUND GUIDANCE VASC ACCESS RIGHT  MEDICATIONS: . The antibiotic was administered within 1 hour of the procedure ANESTHESIA/SEDATION: Moderate (conscious) sedation was employed during this procedure. A total of Versed 0 mg and Fentanyl 0 mcg was administered intravenously. Moderate Sedation Time: minutes. The patient's level of consciousness and vital signs were monitored continuously by radiology nursing throughout the procedure under my direct supervision. CONTRAST:  100 cc Omnipaque 300 FLUOROSCOPY TIME:  Fluoroscopy Time: 31 minutes 12 seconds (1469 mGy). COMPLICATIONS: None immediate. PROCEDURE: Informed consent was obtained from the patient following  explanation of the procedure, risks, benefits and alternatives. The patient understands, agrees and consents for the procedure. All questions were addressed. A time out was performed prior to the initiation of the procedure. Maximal barrier sterile technique utilized including caps, mask, sterile gowns, sterile gloves, large sterile drape, hand hygiene, and Betadine prep. Right groin was prepped and draped in a sterile fashion. Under sonographic guidance, a micropuncture needle was inserted into the right common femoral artery and removed over a 018 wire which was up sized to a Bentson. A 5 French sheath was inserted. The SMA was selected. Angiography was performed. This showed abrupt extravasation of contrast from the gastroduodenal artery at the inferior extent of the coils previously placed. A micro catheter was then advanced into the inferior pancreaticoduodenal artery. And angiography was performed. There are 2 branches leading to the area of extravasation. The micro catheter was successfully placed into the inferior branch and interlock coils were deployed. A 2 mm by 2 cm coil was deployed. Subsequently a 2 mm x 4 cm coil was deployed. A third 2 mm by 2 cm coil was deployed. Cannulation of the second branch was unsuccessful due to the tortuous course. Final angiogram through the SMA  demonstrates persistent contrast extravasation. The Cobra catheter was then exchanged for a sauce Omni catheter. The celiac axis was selected and angiography was performed. Injection of the celiac demonstrated no evidence of contrast extravasation. A micro catheter was then advanced through the celiac axis and into the common hepatic artery. Repeat angiography demonstrates no extravasation of contrast. The sauce Omni catheter was retracted to the right external iliac artery and femoral angiography was performed. The sheath was removed and hemostasis was achieved with an Exoseal device. FINDINGS: Angiography of the SMA demonstrates abrupt contrast extravasation from the gastroduodenal artery at the inferior extent of the coils. There are 2 branches leading to the gastroduodenal artery via the pancreaticoduodenal branches. One of these was successfully embolized with coils and repeat angiogram confirms occlusion. The other branch could not be reached or embolized and final angiogram confirms patency of this vessel with persistent bleeding. IMPRESSION: The diagnostic angiogram confirms persistent extravasation and active bleeding into the duodenum via the gastroduodenal artery at the inferior extent of the coils. Two branches of the Fahrenheit deep pancreaticoduodenal artery were identified as feeding vessels. One of these was successfully embolized. The other could not be embolized and persistent bleeding at the end of the procedure was noted. Injection of the celiac axis demonstrates no evidence of recurrent gastrointestinal bleeding. Electronically Signed   By: Jolaine Click M.D.   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  ANESTHESIA/SEDATION: 50 MCG FENTANYL ONLY Moderate Sedation Time: NONE. The patient's level of consciousness and vital signs were monitored continuously by radiology nursing throughout the procedure under my direct supervision. CONTRAST:  50mL ISOVUE-300 IOPAMIDOL (ISOVUE-300) INJECTION 61% FLUOROSCOPY TIME:  Fluoroscopy Time: 12 minutes 18 seconds (947 mGy). COMPLICATIONS: None immediate. PROCEDURE: Informed consent was obtained from the patient following explanation of the procedure, risks, benefits and alternatives. The patient understands, agrees and consents for the procedure. All questions were addressed. A time out was performed prior to the initiation of the procedure. Maximal barrier sterile technique utilized including caps, mask, sterile  gowns, sterile gloves, large sterile drape, hand hygiene, and Betadine prep. Under sterile conditions and local anesthesia, ultrasound micropuncture access performed of the patent right common femoral artery. Five French sheath inserted over a guidewire. Five French C2 catheter advanced over a Bentson guidewire to the celiac artery. Selective celiac angiogram performed. Celiac: Celiac is widely patent. Left gastric, splenic and hepatic vasculature patent. GDA is patent. There is active extravasation from the gastroduodenal artery localized to the level of the duodenum compatible with a bleeding duodenal ulcer. Renegade STC micro catheter and GT micro Glidewire were advanced through the C2 catheter into the gastroduodenal artery. Selective gastroduodenal angiogram performed. Gastroduodenal artery: Gastroduodenal artery is patent. Active bleeding present from a small distal branch of the gastroduodenal artery compatible with an active bleeding duodenal ulcer. Embolization: Micro catheter was advanced over the catheter and guidewire to the level of the gastroduodenal artery bleeding site. Repeat injection confirms micro catheter position at the site of active bleeding. Micro  coil embolization performed of the gastroduodenal artery with insertion of 3 3 mm x 6 mm interlock coils, 1 4 mm x 8 mm soft coil, 1 4 mm x 12 mm soft coil, and 1 5 mm x 8 mm soft coil. Post embolization angiogram demonstrates occlusion of the gastroduodenal artery with no further active bleeding. Micro catheter was retracted into the common hepatic artery. Common hepatic artery angiogram performed. Common hepatic: This confirms preserved patency of the common, proper, left and right hepatic arteries. Access removed. Hemostasis obtained with manual compression. No immediate complication. Patient tolerated the procedure well. 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 Successful micro coil embolization of the gastroduodenal artery to complete occlusion with no further active bleeding. Electronically Signed   By: Judie Petit.  Shick M.D.   On: 06/05/2015 13:27   Dg Chest Port 1 View  06/07/2015  CLINICAL DATA:  82 year old female with a history of questionable finding on prior study. Endotracheal tube. Central line. EXAM: PORTABLE CHEST 1 VIEW COMPARISON:  06/06/2015, 06/03/2015 FINDINGS: Cardiomediastinal silhouette unchanged. Atherosclerotic calcification of the aortic arch. 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 Endotracheal tube remains in position terminating approximately 3.5 cm above the carina. Unchanged right IJ approach central venous catheter appearing to terminate in the superior vena cava. Gastric tube unchanged terminates in the left upper quadrant. Low lung volumes with mixed interstitial and airspace opacity at the right base partially obscuring the right heart border on the right hemidiaphragm. Retrocardiac region not well visualized with blunting of left costophrenic angle. No pneumothorax. IMPRESSION: Endotracheal tube terminates suitably above the carina. Otherwise unchanged support  apparatus. Similar appearance of basilar, right greater than left interstitial and airspace opacities, potentially atelectasis, edema, and/ or consolidation. Small pleural effusions not excluded. Signed, Yvone Neu. Loreta Ave, DO [facility] Electronically Signed   By: Gilmer Mor D.O.   On: 06/07/2015 07:06   Dg Chest Port 1 View  06/06/2015  CLINICAL DATA:  82 year old with bleeding duodenal ulcer for which the patient has undergone transcatheter therapy, ventilator dependent respiratory failure. EXAM: PORTABLE CHEST 1 VIEW COMPARISON:  06/02/2015 and earlier. FINDINGS: Endotracheal tube tip in satisfactory position projecting approximately 2 cm above the carina. Right jugular central venous catheter tip projects over the lower SVC. Nasogastric tube courses below the diaphragm into the stomach. Interval worsening of atelectasis in the lung bases, with dense atelectasis at the left lung base. Lungs  otherwise clear. Pulmonary vascularity normal. Prior sternotomy CABG with stable mild cardiomegaly. IMPRESSION: 1. Support apparatus satisfactory. 2. Worsening atelectasis in the lung bases, left greater than right, since the examination 4 days ago. 3. Stable mild cardiomegaly without pulmonary edema. Electronically Signed   By: Hulan Saas M.D.   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 ANESTHESIA/SEDATION: 50 MCG FENTANYL ONLY Moderate Sedation Time: NONE. The patient's level of consciousness and vital signs were monitored continuously by radiology nursing throughout the procedure under my direct supervision. CONTRAST:  50mL  ISOVUE-300 IOPAMIDOL (ISOVUE-300) INJECTION 61% FLUOROSCOPY TIME:  Fluoroscopy Time: 12 minutes 18 seconds (947 mGy). COMPLICATIONS: None immediate. PROCEDURE: Informed consent was obtained from the patient following explanation of the procedure, risks, benefits and alternatives. The patient understands, agrees and consents for the procedure. All questions were addressed. A time out was performed prior to the initiation of the procedure. Maximal barrier sterile technique utilized including caps, mask, sterile gowns, sterile gloves, large sterile drape, hand hygiene, and Betadine prep. Under sterile conditions and local anesthesia, ultrasound micropuncture access performed of the patent right common femoral artery. Five French sheath inserted over a guidewire. Five French C2 catheter advanced over a Bentson guidewire to the celiac artery. Selective celiac angiogram performed. Celiac: Celiac is widely patent. Left gastric, splenic and hepatic vasculature patent. GDA is patent. There is active extravasation from the gastroduodenal artery localized to the level of the duodenum compatible with a bleeding duodenal ulcer. Renegade STC micro catheter and GT micro Glidewire were advanced through the C2 catheter into the gastroduodenal artery. Selective gastroduodenal angiogram performed. Gastroduodenal artery: Gastroduodenal artery is patent. Active bleeding present from a small distal branch of the gastroduodenal artery compatible with an active bleeding duodenal ulcer. Embolization: Micro catheter was advanced over the catheter and guidewire to the level of the gastroduodenal artery bleeding site. Repeat injection confirms micro catheter position at the site of active bleeding. Micro coil embolization performed of the gastroduodenal artery with insertion of 3 3 mm x 6 mm interlock coils, 1 4 mm x 8 mm soft coil, 1 4 mm x 12 mm soft coil, and 1 5 mm x 8 mm soft coil. Post embolization angiogram demonstrates occlusion of the  gastroduodenal artery with no further active bleeding. Micro catheter was retracted into the common hepatic artery. Common hepatic artery angiogram performed. Common hepatic: This confirms preserved patency of the common, proper, left and right hepatic arteries. Access removed. Hemostasis obtained with manual compression. No immediate complication. Patient tolerated the procedure well. 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 Successful micro coil embolization of the gastroduodenal artery to complete occlusion with no further active bleeding. Electronically Signed   By: Judie Petit.  Shick M.D.   On: 06/05/2015 13:27    Anti-infectives: Anti-infectives    Start     Dose/Rate Route Frequency Ordered Stop   06/06/15 1400  ampicillin (OMNIPEN) 2 g in sodium chloride 0.9 % 50 mL IVPB     2 g 150 mL/hr over 20 Minutes Intravenous Every 6 hours 06/06/15 0959     06/04/15 1000  vancomycin (VANCOCIN) 1,250 mg in sodium chloride  0.9 % 250 mL IVPB  Status:  Discontinued     1,250 mg 166.7 mL/hr over 90 Minutes Intravenous Every 24 hours 06/04/15 0901 06/04/15 0928   06/04/15 1000  ampicillin (OMNIPEN) 2 g in sodium chloride 0.9 % 50 mL IVPB  Status:  Discontinued     2 g 150 mL/hr over 20 Minutes Intravenous 6 times per day 06/04/15 0929 06/06/15 0959   06/02/15 0630  cefTRIAXone (ROCEPHIN) 2 g in dextrose 5 % 50 mL IVPB  Status:  Discontinued     2 g 100 mL/hr over 30 Minutes Intravenous Daily 06/02/15 0613 06/04/15 0928   06/02/15 0630  vancomycin (VANCOCIN) IVPB 1000 mg/200 mL premix  Status:  Discontinued     1,000 mg 200 mL/hr over 60 Minutes Intravenous Daily 06/02/15 0619 06/04/15 0901   06/02/15 0600  ampicillin-sulbactam (UNASYN) 1.5 g in sodium chloride 0.9 % 50 mL IVPB  Status:  Discontinued     1.5 g 100 mL/hr over 30 Minutes Intravenous Every 12 hours 06/01/15 1555 06/02/15 0613   06/01/15  1700  ampicillin-sulbactam (UNASYN) 1.5 g in sodium chloride 0.9 % 50 mL IVPB     1.5 g 100 mL/hr over 30 Minutes Intravenous  Once 06/01/15 1554 06/01/15 1726      Assessment Active Problems:   Bleeding giant DU s/p arterial embolization and surgical oversewing of bleeding ulcer 06/06/15-passed some old blood last night (she had a significant amount of blood in her small and large intestine which will eventually pass)-HD stable; hemoglobin equilibrating.  Have done all we can for her surgically.  Need to optimize her platelet count and keep INR normal.      NSTEMI (non-ST elevated myocardial infarction) (HCC)    LOS: 6 days   Plan: Give one unit of platelets.  Continue aggressive PPI therapy.   Shiraz Bastyr J 06/07/2015

## 2015-06-08 LAB — RENAL FUNCTION PANEL
Albumin: 1.9 g/dL — ABNORMAL LOW (ref 3.5–5.0)
Anion gap: 9 (ref 5–15)
BUN: 11 mg/dL (ref 6–20)
CALCIUM: 7.7 mg/dL — AB (ref 8.9–10.3)
CO2: 24 mmol/L (ref 22–32)
CREATININE: 1.27 mg/dL — AB (ref 0.44–1.00)
Chloride: 118 mmol/L — ABNORMAL HIGH (ref 101–111)
GFR calc Af Amer: 45 mL/min — ABNORMAL LOW (ref >60)
GFR calc non Af Amer: 38 mL/min — ABNORMAL LOW (ref >60)
GLUCOSE: 89 mg/dL (ref 65–99)
Phosphorus: 3.1 mg/dL (ref 2.5–4.6)
Potassium: 3.3 mmol/L — ABNORMAL LOW (ref 3.5–5.1)
SODIUM: 151 mmol/L — AB (ref 135–145)

## 2015-06-08 LAB — CBC WITH DIFFERENTIAL/PLATELET
BASOS ABS: 0 10*3/uL (ref 0.0–0.1)
BASOS PCT: 0 %
EOS ABS: 0.3 10*3/uL (ref 0.0–0.7)
EOS PCT: 3 %
HCT: 20 % — ABNORMAL LOW (ref 36.0–46.0)
Hemoglobin: 7 g/dL — ABNORMAL LOW (ref 12.0–15.0)
Lymphocytes Relative: 13 %
Lymphs Abs: 1.3 10*3/uL (ref 0.7–4.0)
MCH: 30.4 pg (ref 26.0–34.0)
MCHC: 35 g/dL (ref 30.0–36.0)
MCV: 87 fL (ref 78.0–100.0)
MONO ABS: 0.9 10*3/uL (ref 0.1–1.0)
Monocytes Relative: 9 %
Neutro Abs: 7 10*3/uL (ref 1.7–7.7)
Neutrophils Relative %: 75 %
PLATELETS: 119 10*3/uL — AB (ref 150–400)
RBC: 2.3 MIL/uL — ABNORMAL LOW (ref 3.87–5.11)
RDW: 17 % — AB (ref 11.5–15.5)
WBC: 9.4 10*3/uL (ref 4.0–10.5)

## 2015-06-08 LAB — GLUCOSE, CAPILLARY
GLUCOSE-CAPILLARY: 101 mg/dL — AB (ref 65–99)
GLUCOSE-CAPILLARY: 84 mg/dL (ref 65–99)
GLUCOSE-CAPILLARY: 91 mg/dL (ref 65–99)
GLUCOSE-CAPILLARY: 97 mg/dL (ref 65–99)
GLUCOSE-CAPILLARY: 100 mg/dL — AB (ref 65–99)
Glucose-Capillary: 52 mg/dL — ABNORMAL LOW (ref 65–99)
Glucose-Capillary: 73 mg/dL (ref 65–99)
Glucose-Capillary: 85 mg/dL (ref 65–99)
Glucose-Capillary: 92 mg/dL (ref 65–99)

## 2015-06-08 LAB — PROTIME-INR
INR: 1.23 (ref 0.00–1.49)
Prothrombin Time: 15.2 seconds (ref 11.6–15.2)

## 2015-06-08 LAB — MAGNESIUM: Magnesium: 1.7 mg/dL (ref 1.7–2.4)

## 2015-06-08 MED ORDER — POTASSIUM CHLORIDE 10 MEQ/50ML IV SOLN
10.0000 meq | INTRAVENOUS | Status: AC
  Administered 2015-06-08 (×4): 10 meq via INTRAVENOUS
  Filled 2015-06-08 (×3): qty 50

## 2015-06-08 MED ORDER — DEXTROSE-NACL 5-0.45 % IV SOLN
INTRAVENOUS | Status: AC
  Administered 2015-06-08 (×2): via INTRAVENOUS

## 2015-06-08 MED ORDER — PROCHLORPERAZINE EDISYLATE 5 MG/ML IJ SOLN
5.0000 mg | Freq: Once | INTRAMUSCULAR | Status: AC
  Administered 2015-06-09: 5 mg via INTRAVENOUS
  Filled 2015-06-08: qty 2

## 2015-06-08 MED ORDER — SODIUM CHLORIDE 0.9 % IV SOLN
8.0000 mg/h | INTRAVENOUS | Status: DC
  Administered 2015-06-08 – 2015-06-11 (×6): 8 mg/h via INTRAVENOUS
  Filled 2015-06-08 (×15): qty 80

## 2015-06-08 MED ORDER — POTASSIUM CHLORIDE 10 MEQ/50ML IV SOLN
INTRAVENOUS | Status: AC
  Filled 2015-06-08: qty 50

## 2015-06-08 MED ORDER — DEXTROSE 50 % IV SOLN
25.0000 mL | Freq: Once | INTRAVENOUS | Status: AC
  Administered 2015-06-08: 25 mL via INTRAVENOUS

## 2015-06-08 MED ORDER — MAGNESIUM SULFATE 2 GM/50ML IV SOLN
2.0000 g | Freq: Once | INTRAVENOUS | Status: AC
  Administered 2015-06-08: 2 g via INTRAVENOUS
  Filled 2015-06-08: qty 50

## 2015-06-08 MED ORDER — SODIUM CHLORIDE 0.9 % IV SOLN
Freq: Once | INTRAVENOUS | Status: AC
  Administered 2015-06-08: 10:00:00 via INTRAVENOUS

## 2015-06-08 MED ORDER — FENTANYL CITRATE (PF) 100 MCG/2ML IJ SOLN
25.0000 ug | INTRAMUSCULAR | Status: DC | PRN
  Administered 2015-06-08 – 2015-06-10 (×10): 50 ug via INTRAVENOUS
  Administered 2015-06-10: 25 ug via INTRAVENOUS
  Administered 2015-06-11 – 2015-06-14 (×17): 50 ug via INTRAVENOUS
  Filled 2015-06-08 (×29): qty 2

## 2015-06-08 NOTE — Progress Notes (Signed)
PULMONARY / CRITICAL CARE MEDICINE   Name: Jasmine Newman MRN: [id number] DOB: May 10, 1931    ADMISSION DATE:  06/01/2015 CONSULTATION DATE:  06/01/15  REFERRING MD:  ED  CHIEF COMPLAINT:  GI bleed, hypotension  HISTORY OF PRESENT ILLNESS:   82 year old with past mental history of hypothyroidism, hypertension, hyperlipidemia, depression. Admitted today to the ED after she had a fall at home. She is watching the [facility] basketball game yesterday with her husband. When she stood up to use the bathroom her legs gave out and she fell on the right side. She hurt her leg and chest. She did not hit her head and there was no loss of consciousness. She on the floor the whole night. When her son arrived next morning she was found to be lying in a large pool of melanotic stool. In the ED she was found to be hypertensive with hemoglobin of 7.4 (baseline unknown). She continued to be hypotensive in spite of 2 L of fluid. GI evaluated the patient. PCCM called for admission.  SUBJECTIVE: Hypoglycemia overnight. Patient's Hgb down slightly this morning. Patient did have 1 melanotic stool. Nods no to any chest pain or abdominal pain.  REVIEW OF SYSTEMS:  Unable to obtain due to intubation.  VITAL SIGNS: BP 98/36 mmHg  Pulse 66  Temp(Src) 97.7 F (36.5 C) (Core (Comment))  Resp 12  Ht  (1.651 m)  Wt 235 lb 7.2 oz (106.8 kg)  BMI 39.18 kg/m2  SpO2 99%  LMP  (LMP Unknown)  HEMODYNAMICS: CVP:  [12 mmHg-16 mmHg] 13 mmHg  VENTILATOR SETTINGS: Vent Mode:  [-] PSV;CPAP FiO2 (%):  [30 %-40 %] 30 % Set Rate:  [21 bmp] 21 bmp Vt Set:  [450 mL] 450 mL PEEP:  [5 cmH20] 5 cmH20 Pressure Support:  [5 cmH20] 5 cmH20 Plateau Pressure:  [8 cmH20-16 cmH20] 11 cmH20  INTAKE / OUTPUT: I/O last 3 completed shifts: In: 5522.3 [I.V.:4032.3; Blood:570; Other:120; IV Piggyback:800] Out: 2310 [Urine:1755; Drains:555]  PHYSICAL EXAMINATION: General:  Awake. No distress. Currently intubated. No family at  bedside. Neuro: Following commands. Nods appropriately. Nonfocal. HEENT: Endotracheal tube in place. No scleral icterus or injection. Cardiovascular:  Sinus rhythm with regular rate. No appreciable JVD.  Lungs:  Coarse breath sounds bilaterally unchanged. Symmetric chest wall rise on ventilator. Normal work of breathing on PS 0/5 for 45 minutes. Abdomen: Soft. Normal bowel sounds. Drain in place. Integument:  Warm & dry. No rash on exposed skin.  LABS:  BMET  Recent Labs Lab 06/06/15 1623 06/06/15 1647 06/07/15 0200 06/08/15 0544  NA 146* 145 149*  148* 151*  K 4.0 3.9 3.6  3.5 3.3*  CL 119*  --  118*  118* 118*  CO2 21*  --  BUN 13  --  CREATININE 1.28*  --  1.27*  1.32* 1.27*  GLUCOSE 132*  --  108*  107* 89    Electrolytes  Recent Labs Lab 06/06/15 0200 06/06/15 1623 06/07/15 0200 06/08/15 0544  CALCIUM 7.4* 7.0* 7.8*  7.7* 7.7*  MG 1.9  --  1.7 1.7  PHOS 3.1  --  3.2 3.1    CBC  Recent Labs Lab 06/07/15 0700 06/07/15 1740 06/08/15 0544  WBC 10.2 10.0 9.4  HGB 7.5* 7.2* 7.0*  HCT 21.7* 20.6* 20.0*  PLT 95* 122* 119*    Coag's  Recent Labs Lab 06/03/15 1000 06/06/15 0330 06/06/15 1623  06/07/15 1000 06/07/15 1740 06/08/15 0544  APTT 33 33 33  --   --   --   --   INR 1.27 1.51* 1.63*  < > 1.31 1.27 1.23  < > = values in this interval not displayed.  Sepsis Markers  Recent Labs Lab 06/01/15 1529 06/01/15 2054 06/01/15 2100 06/02/15 0034  LATICACIDVEN 0.87 0.7  --  1.0  PROCALCITON  --   --  0.63  --     ABG  Recent Labs Lab 06/03/15 0401 06/06/15 1647 06/06/15 2045  PHART 7.332* 7.360 7.358  PCO2ART 40.0 37.0 37.0  PO2ART 82.4 247.0* 89.2    Liver Enzymes  Recent Labs Lab 06/01/15 1125  06/06/15 1623 06/07/15 0200 06/08/15 0544  AST 25  --  18  --   --   ALT 19  --  16  --   --   ALKPHOS 64  --  35*  --   --   BILITOT 0.5  --  0.5  --   --   ALBUMIN 2.4*  < > 1.7* 2.1* 1.9*  < > =  values in this interval not displayed.  Cardiac Enzymes  Recent Labs Lab 06/02/15 1656 06/03/15 1800 06/04/15 0400  TROPONINI 3.27* 1.26* 1.27*    Glucose  Recent Labs Lab 06/07/15 1204 06/07/15 1748 06/07/15 1918 06/08/15 0003 06/08/15 0437 06/08/15 0727  GLUCAP 88 73 65 52* 84 73    Imaging No results found.  STUDIES:  X ray rib 4/2: No fractures Port CXR 4/2:  Questionable right basal hazy opacity. R IJ CVL in mid-SVC. Chronic elevation left hemidiaphragm.  R Knee 2View 4/2: No fracture or dislocation. Port CXR 4/4:  Silhouetting of left hemidiaphragm unchanged. Endotracheal tube and central venous catheter in good position. No new focal opacity. TTE 4/4: LV normal in size with EF 45-50%. Akinesis of anterior septal myocardium. Grade 2 diastolic dysfunction. LA & RA normal in size. RV normal in size and function. PASP 40mmHg. Moderate aortic stenosis w/ bioprosthetic aortic valve without regurgitation. Severe mitral vegetation without stenosis. Trivial pulmonic regurgitation without stenosis. Moderate tricuspid regurgitation. Port CXR 4/8: Endotracheal tube and central line in good position. Low lung volumes. Hazy opacification bilaterally.  CULTURES: Blood Ctx x2 4/5:  Enterococcus 1/2 bottles Blood Ctx x2 4/2>>> Enterococcus 1/2 bottles Urine Ctx x2 4/2>>> Enterococcus 2/2 cultures MRSA PCR 4/2:  Negative   ANTIBIOTICS: Ampicillin 4/5>>> Vancomycin 4/3 - 4/5 Rocephin 4/3 - 4/5 Unasyn 4/2  SIGNIFICANT EVENTS: 4/2 - Admit 4/3 - Intubation & EGD 4/4 - Extubation 4/6 - Successful GDA microcoil embo per IR 4/7 - Repeat bleeding>>taken to IR but unsuccessful>>taken to OR & oversewen  LINES/TUBES: OETT 4/3 - 4/4; 7.5 4/7>>> R Abd Drain 4/7>>> OGT 4/7>>> R IJ CVL 4/2>> Foley 4/2>> PIV x1   ASSESSMENT / PLAN:  PULMONARY A: Possible RLL Pneumonia Acute Hypoxic Respiratory Failure - Likely secondary to anemia & IV blood/fluid resuscitation. Intubated for  EGD 4/3 then extubated 4/4. Intubated for OR on 4/7.  P:   Passed SBT this morning Extubate if cuff leak present Weaning FiO2 for saturation >92% Incentive Spirometry for pulmonary toilette once extubated  CARDIOVASCULAR A:  Shock - Intermittent. Sepsis vs GIB. Moderate Aortic Stenosis / Severe Mitral Regurg - Seen on TTE 4/4. Demand Ischemia 2/2 anemia NSTEMI - Possible MI given wall motion abnormality on TTE 4/4. Atrial Fibrillation - Intermittent. H/O HTN H/O Hyperlipdemia H/O Bioprosthetic Aortic Valve   P:  Neosynephrine to maintain MAP>65 No systemic anticoagulation given GIB Monitor on telemetry  Vitals per unit protocol Holding Lisinopril & Lopressor Plan for Cardiology Consult if patient stabilizes TEE for possible endocarditis pending  RENAL A:   Acute Renal Failure - Stable. Hypomagnesemia - Resolved.  Hypokalemia - Mild. Hypernatremia - Mild.  P:   KCl IV x4 runs Magnesium Sulfate 2gm IV D5 1/2 NS MIVF @ 75cc/hr Trending UOP with Foley Monitoring daily renal function & electrolytes Replacing electrolytes as indicated  GASTROINTESTINAL A:   Acute GIB - Secondary to Duodenal Ulcer on EGD 4/3. Failed embolization 4/6. Surgery 4/7.  P:   General Surgery Following Continue Protonix gtt NPO  HEMATOLOGIC A:   Anemia - Secondary to GIB. S/P multiple PRBC & FFP. Thrombocytopenia - Mild & stable. Likely consumption. S/P 1u Platelets 4/8. Coagulopathy - Dilution vs Consumption.  P:  Transfusing 1u PRBC this AM Post-Transfusion Hgb/Hct Trending cell counts daily w/ CBC Trending Coags daily Goal Hgb >8.0 w/ MI SCDs  INFECTIOUS A:   Sepsis - Secondary to UTI & Bacteremia Enterococcus Bacteremia - Persistent. Enterococcus UTI  P:   ID Following & appreciate recommendations Repeat Cultures persistently positive Ampicillin Day #8 Procalcitonin per algorithm Needs TEE - ordered but unable to do on weekends so I am told  ENDOCRINE A:    Intermittent Hypoglycmemia Questionable Adrenal Insufficiency H/O Hypothyroidism  P:   Synthroid IV daily D5 1/2 NS MIVF Accu-Checks q4hr SSI per algorithm Holding on steroid therapy at this time given duodenal ulcer  NEUROLOGIC A:   Pain in Right Knee - No fracture of X-ray. H/O Depression  P:   Goal RASS:  -1 to -2 D/C Propofol gtt once extubated Fentanyl IV prn Holding Wellbutrin XL & Cymbalta Holding pain medication   FAMILY UPDATES:  Husband updated at bedside by Dr. Margart Sickles & Dr. Jamison Neighbor on 4/7. No family at bedside 4/9.  TODAY'S SUMMARY:  An 82 year old status post fall with non-STEMI, bleeding duodenal ulcer, & persistent enterococcus bacteremia with bioprosthetic valve. Continuing broad-spectrum antibiotics. Status post failed embolization with interventional radiology and now status post surgical intervention for bleeding ulcer. Continuing Protonix drip. No evidence of active bleeding. Trending Hgb daily for now to minimize blood draws. TEE pending for persistent bacteremia & will need repeat blood cultures in the next day or so. Plan for extubation this morning if a cuff leak is present.  I have spent a total of 31 minutes of critical care time today caring for the patient and reviewing the patient's electronic medical record.  Donna Christen Jamison Neighbor, M.D. [facility] Pulmonary & Critical Care Pager:  [phone] After 3pm or if no response, call [phone] 8:49 AM 06/08/2015

## 2015-06-08 NOTE — Procedures (Signed)
Extubation Procedure Note  Patient Details:   Name: Jasmine Newman DOB: January 08, 1932 MRN: [id number]   Airway Documentation:     Evaluation  O2 sats: stable throughout Complications: No apparent complications Patient did tolerate procedure well. Bilateral Breath Sounds: Clear   Yes  Kendall FlackJackson, Adil Tugwell Royal 06/08/2015, 10:23 AM

## 2015-06-08 NOTE — Progress Notes (Signed)
eLink Physician-Brief Progress Note Patient Name: [name] DOB: 06/28/1931 MRN: [id number]   Date of Service  06/08/2015  HPI/Events of Note  Persistent nausea.  Already to zofran >> not  Improved.   eICU Interventions  Compazine 5 mg IV x one.     Intervention Category Major Interventions: Other:  Hiyab Nhem 06/08/2015, 11:49 PM

## 2015-06-08 NOTE — Progress Notes (Signed)
2 Days Post-Op  Subjective: Awake on vent.  Following commands.  Objective: Vital signs in last 24 hours: Temp:  [97.5 F (36.4 C)-98.2 F (36.8 C)] 97.9 F (36.6 C) (04/09 1000) Pulse Rate:  [42-83] 74 (04/09 1000) Resp:  [12-25] 15 (04/09 1000) BP: (63-129)/(35-89) 122/52 mmHg (04/09 1000) SpO2:  [94 %-100 %] 98 % (04/09 1000) FiO2 (%):  [30 %] 30 % (04/09 0800) Weight:  [106.8 kg (235 lb 7.2 oz)] 106.8 kg (235 lb 7.2 oz) (04/09 0500) Last BM Date: 06/06/15  Intake/Output from previous day: 04/08 0701 - 04/09 0700 In: 2639.7 [I.V.:2204.7; Blood:235; IV Piggyback:200] Out: 1420 [Urine:1055; Drains:365] Intake/Output this shift: Total I/O In: 333 [I.V.:103; Blood:180; IV Piggyback:50] Out: 75 [Urine:60; Drains:15]  PE: General- In NAD Abdomen-soft, thin serosanguinous drain output, dressing dry  Lab Results:   Recent Labs  06/07/15 1740 06/08/15 0544  WBC 10.0 9.4  HGB 7.2* 7.0*  HCT 20.6* 20.0*  PLT 122* 119*   BMET  Recent Labs  06/07/15 0200 06/08/15 0544  NA 149*  148* 151*  K 3.6  3.5 3.3*  CL 118*  118* 118*  CO2 24  24 24   GLUCOSE 108*  107* 89  BUN 12  13 11   CREATININE 1.27*  1.32* 1.27*  CALCIUM 7.8*  7.7* 7.7*   PT/INR  Recent Labs  06/07/15 1740 06/08/15 0544  LABPROT 15.6* 15.2  INR 1.27 1.23   Comprehensive Metabolic Panel:    Component Value Date/Time   NA 151* 06/08/2015 0544   NA 149* 06/07/2015 0200   NA 148* 06/07/2015 0200   K 3.3* 06/08/2015 0544   K 3.6 06/07/2015 0200   K 3.5 06/07/2015 0200   CL 118* 06/08/2015 0544   CL 118* 06/07/2015 0200   CL 118* 06/07/2015 0200   CO2 24 06/08/2015 0544   CO2 24 06/07/2015 0200   CO2 24 06/07/2015 0200   BUN 11 06/08/2015 0544   BUN 12 06/07/2015 0200   BUN 13 06/07/2015 0200   CREATININE 1.27* 06/08/2015 0544   CREATININE 1.27* 06/07/2015 0200   CREATININE 1.32* 06/07/2015 0200   CREATININE 1.14* 06/28/2011 1328   GLUCOSE 89 06/08/2015 0544   GLUCOSE 108*  06/07/2015 0200   GLUCOSE 107* 06/07/2015 0200   CALCIUM 7.7* 06/08/2015 0544   CALCIUM 7.8* 06/07/2015 0200   CALCIUM 7.7* 06/07/2015 0200   AST 18 06/06/2015 1623   AST 25 06/01/2015 1125   ALT 16 06/06/2015 1623   ALT 19 06/01/2015 1125   ALKPHOS 35* 06/06/2015 1623   ALKPHOS 64 06/01/2015 1125   BILITOT 0.5 06/06/2015 1623   BILITOT 0.5 06/01/2015 1125   PROT 3.4* 06/06/2015 1623   PROT 6.1* 06/01/2015 1125   ALBUMIN 1.9* 06/08/2015 0544   ALBUMIN 2.1* 06/07/2015 0200     Studies/Results: Ir Angiogram Visceral Selective  06/06/2015  INDICATION: Gastrointestinal bleeding EXAM: SELECTIVE VISCERAL ARTERIOGRAPHY; ADDITIONAL ARTERIOGRAPHY; IR ULTRASOUND GUIDANCE VASC ACCESS RIGHT MEDICATIONS: . The antibiotic was administered within 1 hour of the procedure ANESTHESIA/SEDATION: Moderate (conscious) sedation was employed during this procedure. A total of Versed 0 mg and Fentanyl 0 mcg was administered intravenously. Moderate Sedation Time: minutes. The patient's level of consciousness and vital signs were monitored continuously by radiology nursing throughout the procedure under my direct supervision. CONTRAST:  100 cc Omnipaque 300 FLUOROSCOPY TIME:  Fluoroscopy Time: 31 minutes 12 seconds (1469 mGy). COMPLICATIONS: None immediate. PROCEDURE: Informed consent was obtained from the patient following explanation of the procedure, risks, benefits and  alternatives. The patient understands, agrees and consents for the procedure. All questions were addressed. A time out was performed prior to the initiation of the procedure. Maximal barrier sterile technique utilized including caps, mask, sterile gowns, sterile gloves, large sterile drape, hand hygiene, and Betadine prep. Right groin was prepped and draped in a sterile fashion. Under sonographic guidance, a micropuncture needle was inserted into the right common femoral artery and removed over a 018 wire which was up sized to a Bentson. A 5 French  sheath was inserted. The SMA was selected. Angiography was performed. This showed abrupt extravasation of contrast from the gastroduodenal artery at the inferior extent of the coils previously placed. A micro catheter was then advanced into the inferior pancreaticoduodenal artery. And angiography was performed. There are 2 branches leading to the area of extravasation. The micro catheter was successfully placed into the inferior branch and interlock coils were deployed. A 2 mm by 2 cm coil was deployed. Subsequently a 2 mm x 4 cm coil was deployed. A third 2 mm by 2 cm coil was deployed. Cannulation of the second branch was unsuccessful due to the tortuous course. Final angiogram through the SMA demonstrates persistent contrast extravasation. The Cobra catheter was then exchanged for a sauce Omni catheter. The celiac axis was selected and angiography was performed. Injection of the celiac demonstrated no evidence of contrast extravasation. A micro catheter was then advanced through the celiac axis and into the common hepatic artery. Repeat angiography demonstrates no extravasation of contrast. The sauce Omni catheter was retracted to the right external iliac artery and femoral angiography was performed. The sheath was removed and hemostasis was achieved with an Exoseal device. FINDINGS: Angiography of the SMA demonstrates abrupt contrast extravasation from the gastroduodenal artery at the inferior extent of the coils. There are 2 branches leading to the gastroduodenal artery via the pancreaticoduodenal branches. One of these was successfully embolized with coils and repeat angiogram confirms occlusion. The other branch could not be reached or embolized and final angiogram confirms patency of this vessel with persistent bleeding. IMPRESSION: The diagnostic angiogram confirms persistent extravasation and active bleeding into the duodenum via the gastroduodenal artery at the inferior extent of the coils. Two branches  of the Fahrenheit deep pancreaticoduodenal artery were identified as feeding vessels. One of these was successfully embolized. The other could not be embolized and persistent bleeding at the end of the procedure was noted. Injection of the celiac axis demonstrates no evidence of recurrent gastrointestinal bleeding. Electronically Signed   By: Jolaine Click M.D.   On: 06/06/2015 17:21   Ir Angiogram Visceral Selective  06/06/2015  INDICATION: Gastrointestinal bleeding EXAM: SELECTIVE VISCERAL ARTERIOGRAPHY; ADDITIONAL ARTERIOGRAPHY; IR ULTRASOUND GUIDANCE VASC ACCESS RIGHT MEDICATIONS: . The antibiotic was administered within 1 hour of the procedure ANESTHESIA/SEDATION: Moderate (conscious) sedation was employed during this procedure. A total of Versed 0 mg and Fentanyl 0 mcg was administered intravenously. Moderate Sedation Time: minutes. The patient's level of consciousness and vital signs were monitored continuously by radiology nursing throughout the procedure under my direct supervision. CONTRAST:  100 cc Omnipaque 300 FLUOROSCOPY TIME:  Fluoroscopy Time: 31 minutes 12 seconds (1469 mGy). COMPLICATIONS: None immediate. PROCEDURE: Informed consent was obtained from the patient following explanation of the procedure, risks, benefits and alternatives. The patient understands, agrees and consents for the procedure. All questions were addressed. A time out was performed prior to the initiation of the procedure. Maximal barrier sterile technique utilized including caps, mask, sterile gowns, sterile gloves, large sterile  drape, hand hygiene, and Betadine prep. Right groin was prepped and draped in a sterile fashion. Under sonographic guidance, a micropuncture needle was inserted into the right common femoral artery and removed over a 018 wire which was up sized to a Bentson. A 5 French sheath was inserted. The SMA was selected. Angiography was performed. This showed abrupt extravasation of contrast from the  gastroduodenal artery at the inferior extent of the coils previously placed. A micro catheter was then advanced into the inferior pancreaticoduodenal artery. And angiography was performed. There are 2 branches leading to the area of extravasation. The micro catheter was successfully placed into the inferior branch and interlock coils were deployed. A 2 mm by 2 cm coil was deployed. Subsequently a 2 mm x 4 cm coil was deployed. A third 2 mm by 2 cm coil was deployed. Cannulation of the second branch was unsuccessful due to the tortuous course. Final angiogram through the SMA demonstrates persistent contrast extravasation. The Cobra catheter was then exchanged for a sauce Omni catheter. The celiac axis was selected and angiography was performed. Injection of the celiac demonstrated no evidence of contrast extravasation. A micro catheter was then advanced through the celiac axis and into the common hepatic artery. Repeat angiography demonstrates no extravasation of contrast. The sauce Omni catheter was retracted to the right external iliac artery and femoral angiography was performed. The sheath was removed and hemostasis was achieved with an Exoseal device. FINDINGS: Angiography of the SMA demonstrates abrupt contrast extravasation from the gastroduodenal artery at the inferior extent of the coils. There are 2 branches leading to the gastroduodenal artery via the pancreaticoduodenal branches. One of these was successfully embolized with coils and repeat angiogram confirms occlusion. The other branch could not be reached or embolized and final angiogram confirms patency of this vessel with persistent bleeding. IMPRESSION: The diagnostic angiogram confirms persistent extravasation and active bleeding into the duodenum via the gastroduodenal artery at the inferior extent of the coils. Two branches of the Fahrenheit deep pancreaticoduodenal artery were identified as feeding vessels. One of these was successfully  embolized. The other could not be embolized and persistent bleeding at the end of the procedure was noted. Injection of the celiac axis demonstrates no evidence of recurrent gastrointestinal bleeding. Electronically Signed   By: Jolaine Click M.D.   On: 06/06/2015 17:21   Ir Angiogram Selective Each Additional Vessel  06/06/2015  INDICATION: Gastrointestinal bleeding EXAM: SELECTIVE VISCERAL ARTERIOGRAPHY; ADDITIONAL ARTERIOGRAPHY; IR ULTRASOUND GUIDANCE VASC ACCESS RIGHT MEDICATIONS: . The antibiotic was administered within 1 hour of the procedure ANESTHESIA/SEDATION: Moderate (conscious) sedation was employed during this procedure. A total of Versed 0 mg and Fentanyl 0 mcg was administered intravenously. Moderate Sedation Time: minutes. The patient's level of consciousness and vital signs were monitored continuously by radiology nursing throughout the procedure under my direct supervision. CONTRAST:  100 cc Omnipaque 300 FLUOROSCOPY TIME:  Fluoroscopy Time: 31 minutes 12 seconds (1469 mGy). COMPLICATIONS: None immediate. PROCEDURE: Informed consent was obtained from the patient following explanation of the procedure, risks, benefits and alternatives. The patient understands, agrees and consents for the procedure. All questions were addressed. A time out was performed prior to the initiation of the procedure. Maximal barrier sterile technique utilized including caps, mask, sterile gowns, sterile gloves, large sterile drape, hand hygiene, and Betadine prep. Right groin was prepped and draped in a sterile fashion. Under sonographic guidance, a micropuncture needle was inserted into the right common femoral artery and removed over a 018 wire which was  up sized to a Bentson. A 5 French sheath was inserted. The SMA was selected. Angiography was performed. This showed abrupt extravasation of contrast from the gastroduodenal artery at the inferior extent of the coils previously placed. A micro catheter was then advanced  into the inferior pancreaticoduodenal artery. And angiography was performed. There are 2 branches leading to the area of extravasation. The micro catheter was successfully placed into the inferior branch and interlock coils were deployed. A 2 mm by 2 cm coil was deployed. Subsequently a 2 mm x 4 cm coil was deployed. A third 2 mm by 2 cm coil was deployed. Cannulation of the second branch was unsuccessful due to the tortuous course. Final angiogram through the SMA demonstrates persistent contrast extravasation. The Cobra catheter was then exchanged for a sauce Omni catheter. The celiac axis was selected and angiography was performed. Injection of the celiac demonstrated no evidence of contrast extravasation. A micro catheter was then advanced through the celiac axis and into the common hepatic artery. Repeat angiography demonstrates no extravasation of contrast. The sauce Omni catheter was retracted to the right external iliac artery and femoral angiography was performed. The sheath was removed and hemostasis was achieved with an Exoseal device. FINDINGS: Angiography of the SMA demonstrates abrupt contrast extravasation from the gastroduodenal artery at the inferior extent of the coils. There are 2 branches leading to the gastroduodenal artery via the pancreaticoduodenal branches. One of these was successfully embolized with coils and repeat angiogram confirms occlusion. The other branch could not be reached or embolized and final angiogram confirms patency of this vessel with persistent bleeding. IMPRESSION: The diagnostic angiogram confirms persistent extravasation and active bleeding into the duodenum via the gastroduodenal artery at the inferior extent of the coils. Two branches of the Fahrenheit deep pancreaticoduodenal artery were identified as feeding vessels. One of these was successfully embolized. The other could not be embolized and persistent bleeding at the end of the procedure was noted. Injection of  the celiac axis demonstrates no evidence of recurrent gastrointestinal bleeding. Electronically Signed   By: Jolaine ClickArthur  Hoss M.D.   On: 06/06/2015 17:21   Ir Angiogram Selective Each Additional Vessel  06/06/2015  INDICATION: Gastrointestinal bleeding EXAM: SELECTIVE VISCERAL ARTERIOGRAPHY; ADDITIONAL ARTERIOGRAPHY; IR ULTRASOUND GUIDANCE VASC ACCESS RIGHT MEDICATIONS: . The antibiotic was administered within 1 hour of the procedure ANESTHESIA/SEDATION: Moderate (conscious) sedation was employed during this procedure. A total of Versed 0 mg and Fentanyl 0 mcg was administered intravenously. Moderate Sedation Time: minutes. The patient's level of consciousness and vital signs were monitored continuously by radiology nursing throughout the procedure under my direct supervision. CONTRAST:  100 cc Omnipaque 300 FLUOROSCOPY TIME:  Fluoroscopy Time: 31 minutes 12 seconds (1469 mGy). COMPLICATIONS: None immediate. PROCEDURE: Informed consent was obtained from the patient following explanation of the procedure, risks, benefits and alternatives. The patient understands, agrees and consents for the procedure. All questions were addressed. A time out was performed prior to the initiation of the procedure. Maximal barrier sterile technique utilized including caps, mask, sterile gowns, sterile gloves, large sterile drape, hand hygiene, and Betadine prep. Right groin was prepped and draped in a sterile fashion. Under sonographic guidance, a micropuncture needle was inserted into the right common femoral artery and removed over a 018 wire which was up sized to a Bentson. A 5 French sheath was inserted. The SMA was selected. Angiography was performed. This showed abrupt extravasation of contrast from the gastroduodenal artery at the inferior extent of the coils previously placed. A  micro catheter was then advanced into the inferior pancreaticoduodenal artery. And angiography was performed. There are 2 branches leading to the area of  extravasation. The micro catheter was successfully placed into the inferior branch and interlock coils were deployed. A 2 mm by 2 cm coil was deployed. Subsequently a 2 mm x 4 cm coil was deployed. A third 2 mm by 2 cm coil was deployed. Cannulation of the second branch was unsuccessful due to the tortuous course. Final angiogram through the SMA demonstrates persistent contrast extravasation. The Cobra catheter was then exchanged for a sauce Omni catheter. The celiac axis was selected and angiography was performed. Injection of the celiac demonstrated no evidence of contrast extravasation. A micro catheter was then advanced through the celiac axis and into the common hepatic artery. Repeat angiography demonstrates no extravasation of contrast. The sauce Omni catheter was retracted to the right external iliac artery and femoral angiography was performed. The sheath was removed and hemostasis was achieved with an Exoseal device. FINDINGS: Angiography of the SMA demonstrates abrupt contrast extravasation from the gastroduodenal artery at the inferior extent of the coils. There are 2 branches leading to the gastroduodenal artery via the pancreaticoduodenal branches. One of these was successfully embolized with coils and repeat angiogram confirms occlusion. The other branch could not be reached or embolized and final angiogram confirms patency of this vessel with persistent bleeding. IMPRESSION: The diagnostic angiogram confirms persistent extravasation and active bleeding into the duodenum via the gastroduodenal artery at the inferior extent of the coils. Two branches of the Fahrenheit deep pancreaticoduodenal artery were identified as feeding vessels. One of these was successfully embolized. The other could not be embolized and persistent bleeding at the end of the procedure was noted. Injection of the celiac axis demonstrates no evidence of recurrent gastrointestinal bleeding. Electronically Signed   By: Jolaine Click  M.D.   On: 06/06/2015 17:21   Ir Angiogram Selective Each Additional Vessel  06/06/2015  INDICATION: Gastrointestinal bleeding EXAM: SELECTIVE VISCERAL ARTERIOGRAPHY; ADDITIONAL ARTERIOGRAPHY; IR ULTRASOUND GUIDANCE VASC ACCESS RIGHT MEDICATIONS: . The antibiotic was administered within 1 hour of the procedure ANESTHESIA/SEDATION: Moderate (conscious) sedation was employed during this procedure. A total of Versed 0 mg and Fentanyl 0 mcg was administered intravenously. Moderate Sedation Time: minutes. The patient's level of consciousness and vital signs were monitored continuously by radiology nursing throughout the procedure under my direct supervision. CONTRAST:  100 cc Omnipaque 300 FLUOROSCOPY TIME:  Fluoroscopy Time: 31 minutes 12 seconds (1469 mGy). COMPLICATIONS: None immediate. PROCEDURE: Informed consent was obtained from the patient following explanation of the procedure, risks, benefits and alternatives. The patient understands, agrees and consents for the procedure. All questions were addressed. A time out was performed prior to the initiation of the procedure. Maximal barrier sterile technique utilized including caps, mask, sterile gowns, sterile gloves, large sterile drape, hand hygiene, and Betadine prep. Right groin was prepped and draped in a sterile fashion. Under sonographic guidance, a micropuncture needle was inserted into the right common femoral artery and removed over a 018 wire which was up sized to a Bentson. A 5 French sheath was inserted. The SMA was selected. Angiography was performed. This showed abrupt extravasation of contrast from the gastroduodenal artery at the inferior extent of the coils previously placed. A micro catheter was then advanced into the inferior pancreaticoduodenal artery. And angiography was performed. There are 2 branches leading to the area of extravasation. The micro catheter was successfully placed into the inferior branch and interlock coils were  deployed. A 2  mm by 2 cm coil was deployed. Subsequently a 2 mm x 4 cm coil was deployed. A third 2 mm by 2 cm coil was deployed. Cannulation of the second branch was unsuccessful due to the tortuous course. Final angiogram through the SMA demonstrates persistent contrast extravasation. The Cobra catheter was then exchanged for a sauce Omni catheter. The celiac axis was selected and angiography was performed. Injection of the celiac demonstrated no evidence of contrast extravasation. A micro catheter was then advanced through the celiac axis and into the common hepatic artery. Repeat angiography demonstrates no extravasation of contrast. The sauce Omni catheter was retracted to the right external iliac artery and femoral angiography was performed. The sheath was removed and hemostasis was achieved with an Exoseal device. FINDINGS: Angiography of the SMA demonstrates abrupt contrast extravasation from the gastroduodenal artery at the inferior extent of the coils. There are 2 branches leading to the gastroduodenal artery via the pancreaticoduodenal branches. One of these was successfully embolized with coils and repeat angiogram confirms occlusion. The other branch could not be reached or embolized and final angiogram confirms patency of this vessel with persistent bleeding. IMPRESSION: The diagnostic angiogram confirms persistent extravasation and active bleeding into the duodenum via the gastroduodenal artery at the inferior extent of the coils. Two branches of the Fahrenheit deep pancreaticoduodenal artery were identified as feeding vessels. One of these was successfully embolized. The other could not be embolized and persistent bleeding at the end of the procedure was noted. Injection of the celiac axis demonstrates no evidence of recurrent gastrointestinal bleeding. Electronically Signed   By: Jolaine Click M.D.   On: 06/06/2015 17:21   Ir US Guide Vasc Access Right  06/06/2015  INDICATION: Gastrointestinal bleeding EXAM:  SELECTIVE VISCERAL ARTERIOGRAPHY; ADDITIONAL ARTERIOGRAPHY; IR ULTRASOUND GUIDANCE VASC ACCESS RIGHT MEDICATIONS: . The antibiotic was administered within 1 hour of the procedure ANESTHESIA/SEDATION: Moderate (conscious) sedation was employed during this procedure. A total of Versed 0 mg and Fentanyl 0 mcg was administered intravenously. Moderate Sedation Time: minutes. The patient's level of consciousness and vital signs were monitored continuously by radiology nursing throughout the procedure under my direct supervision. CONTRAST:  100 cc Omnipaque 300 FLUOROSCOPY TIME:  Fluoroscopy Time: 31 minutes 12 seconds (1469 mGy). COMPLICATIONS: None immediate. PROCEDURE: Informed consent was obtained from the patient following explanation of the procedure, risks, benefits and alternatives. The patient understands, agrees and consents for the procedure. All questions were addressed. A time out was performed prior to the initiation of the procedure. Maximal barrier sterile technique utilized including caps, mask, sterile gowns, sterile gloves, large sterile drape, hand hygiene, and Betadine prep. Right groin was prepped and draped in a sterile fashion. Under sonographic guidance, a micropuncture needle was inserted into the right common femoral artery and removed over a 018 wire which was up sized to a Bentson. A 5 French sheath was inserted. The SMA was selected. Angiography was performed. This showed abrupt extravasation of contrast from the gastroduodenal artery at the inferior extent of the coils previously placed. A micro catheter was then advanced into the inferior pancreaticoduodenal artery. And angiography was performed. There are 2 branches leading to the area of extravasation. The micro catheter was successfully placed into the inferior branch and interlock coils were deployed. A 2 mm by 2 cm coil was deployed. Subsequently a 2 mm x 4 cm coil was deployed. A third 2 mm by 2 cm coil was deployed. Cannulation of the  second branch was unsuccessful  due to the tortuous course. Final angiogram through the SMA demonstrates persistent contrast extravasation. The Cobra catheter was then exchanged for a sauce Omni catheter. The celiac axis was selected and angiography was performed. Injection of the celiac demonstrated no evidence of contrast extravasation. A micro catheter was then advanced through the celiac axis and into the common hepatic artery. Repeat angiography demonstrates no extravasation of contrast. The sauce Omni catheter was retracted to the right external iliac artery and femoral angiography was performed. The sheath was removed and hemostasis was achieved with an Exoseal device. FINDINGS: Angiography of the SMA demonstrates abrupt contrast extravasation from the gastroduodenal artery at the inferior extent of the coils. There are 2 branches leading to the gastroduodenal artery via the pancreaticoduodenal branches. One of these was successfully embolized with coils and repeat angiogram confirms occlusion. The other branch could not be reached or embolized and final angiogram confirms patency of this vessel with persistent bleeding. IMPRESSION: The diagnostic angiogram confirms persistent extravasation and active bleeding into the duodenum via the gastroduodenal artery at the inferior extent of the coils. Two branches of the Fahrenheit deep pancreaticoduodenal artery were identified as feeding vessels. One of these was successfully embolized. The other could not be embolized and persistent bleeding at the end of the procedure was noted. Injection of the celiac axis demonstrates no evidence of recurrent gastrointestinal bleeding. Electronically Signed   By: Jolaine Click M.D.   On: 06/06/2015 17:21   Dg Chest Port 1 View  06/07/2015  CLINICAL DATA:  82 year old female with a history of questionable finding on prior study. Endotracheal tube. Central line. EXAM: PORTABLE CHEST 1 VIEW COMPARISON:  06/06/2015, 06/03/2015  FINDINGS: Cardiomediastinal silhouette unchanged. Atherosclerotic calcification of the aortic arch. 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 Endotracheal tube remains in position terminating approximately 3.5 cm above the carina. Unchanged right IJ approach central venous catheter appearing to terminate in the superior vena cava. Gastric tube unchanged terminates in the left upper quadrant. Low lung volumes with mixed interstitial and airspace opacity at the right base partially obscuring the right heart border on the right hemidiaphragm. Retrocardiac region not well visualized with blunting of left costophrenic angle. No pneumothorax. IMPRESSION: Endotracheal tube terminates suitably above the carina. Otherwise unchanged support apparatus. Similar appearance of basilar, right greater than left interstitial and airspace opacities, potentially atelectasis, edema, and/ or consolidation. Small pleural effusions not excluded. Signed, Yvone Neu. Loreta Ave, DO [facility] Electronically Signed   By: Gilmer Mor D.O.   On: 06/07/2015 07:06   Dg Chest Port 1 View  06/06/2015  CLINICAL DATA:  82 year old with bleeding duodenal ulcer for which the patient has undergone transcatheter therapy, ventilator dependent respiratory failure. EXAM: PORTABLE CHEST 1 VIEW COMPARISON:  06/02/2015 and earlier. FINDINGS: Endotracheal tube tip in satisfactory position projecting approximately 2 cm above the carina. Right jugular central venous catheter tip projects over the lower SVC. Nasogastric tube courses below the diaphragm into the stomach. Interval worsening of atelectasis in the lung bases, with dense atelectasis at the left lung base. Lungs otherwise clear. Pulmonary vascularity normal. Prior sternotomy CABG with stable mild cardiomegaly. IMPRESSION: 1. Support apparatus satisfactory. 2. Worsening atelectasis in the lung bases, left greater than right, since the  examination 4 days ago. 3. Stable mild cardiomegaly without pulmonary edema. Electronically Signed   By: Hulan Saas M.D.   On: 06/06/2015 18:16    Anti-infectives: Anti-infectives    Start     Dose/Rate Route Frequency  Ordered Stop   06/06/15 1400  ampicillin (OMNIPEN) 2 g in sodium chloride 0.9 % 50 mL IVPB     2 g 150 mL/hr over 20 Minutes Intravenous Every 6 hours 06/06/15 0959     06/04/15 1000  vancomycin (VANCOCIN) 1,250 mg in sodium chloride 0.9 % 250 mL IVPB  Status:  Discontinued     1,250 mg 166.7 mL/hr over 90 Minutes Intravenous Every 24 hours 06/04/15 0901 06/04/15 0928   06/04/15 1000  ampicillin (OMNIPEN) 2 g in sodium chloride 0.9 % 50 mL IVPB  Status:  Discontinued     2 g 150 mL/hr over 20 Minutes Intravenous 6 times per day 06/04/15 0929 06/06/15 0959   06/02/15 0630  cefTRIAXone (ROCEPHIN) 2 g in dextrose 5 % 50 mL IVPB  Status:  Discontinued     2 g 100 mL/hr over 30 Minutes Intravenous Daily 06/02/15 0613 06/04/15 0928   06/02/15 0630  vancomycin (VANCOCIN) IVPB 1000 mg/200 mL premix  Status:  Discontinued     1,000 mg 200 mL/hr over 60 Minutes Intravenous Daily 06/02/15 0619 06/04/15 0901   06/02/15 0600  ampicillin-sulbactam (UNASYN) 1.5 g in sodium chloride 0.9 % 50 mL IVPB  Status:  Discontinued     1.5 g 100 mL/hr over 30 Minutes Intravenous Every 12 hours 06/01/15 1555 06/02/15 0613   06/01/15 1700  ampicillin-sulbactam (UNASYN) 1.5 g in sodium chloride 0.9 % 50 mL IVPB     1.5 g 100 mL/hr over 30 Minutes Intravenous  Once 06/01/15 1554 06/01/15 1726      Assessment Active Problems:   Bleeding giant DU s/p arterial embolization and surgical oversewing of bleeding ulcer 06/06/15-hemoglobin 7.5->7.2->7.0 past 24hrs.  INR normal.  Platelet count open 100,00  Have done all we can for her surgically.        NSTEMI (non-ST elevated myocardial infarction) (HCC)    LOS: 7 days   Plan:  Continue aggressive PPI therapy and support.   Denajah Farias  J 06/08/2015

## 2015-06-09 ENCOUNTER — Encounter (HOSPITAL_COMMUNITY): Payer: Self-pay | Admitting: General Surgery

## 2015-06-09 DIAGNOSIS — Z9889 Other specified postprocedural states: Secondary | ICD-10-CM

## 2015-06-09 LAB — CBC WITH DIFFERENTIAL/PLATELET
BASOS ABS: 0 10*3/uL (ref 0.0–0.1)
BASOS PCT: 0 %
Basophils Absolute: 0 10*3/uL (ref 0.0–0.1)
Basophils Relative: 0 %
Eosinophils Absolute: 0.4 10*3/uL (ref 0.0–0.7)
Eosinophils Absolute: 0.2 10*3/uL (ref 0.0–0.7)
Eosinophils Relative: 4 %
Eosinophils Relative: 2 %
HEMATOCRIT: 24.7 % — AB (ref 36.0–46.0)
HEMATOCRIT: 26 % — AB (ref 36.0–46.0)
HEMOGLOBIN: 8.5 g/dL — AB (ref 12.0–15.0)
Hemoglobin: 8.8 g/dL — ABNORMAL LOW (ref 12.0–15.0)
LYMPHS ABS: 1 10*3/uL (ref 0.7–4.0)
LYMPHS PCT: 14 %
LYMPHS PCT: 11 %
Lymphs Abs: 1.3 10*3/uL (ref 0.7–4.0)
MCH: 30 pg (ref 26.0–34.0)
MCH: 30 pg (ref 26.0–34.0)
MCHC: 34.4 g/dL (ref 30.0–36.0)
MCHC: 33.8 g/dL (ref 30.0–36.0)
MCV: 87.3 fL (ref 78.0–100.0)
MCV: 88.7 fL (ref 78.0–100.0)
MONO ABS: 0.6 10*3/uL (ref 0.1–1.0)
MONOS PCT: 7 %
Monocytes Absolute: 0.9 10*3/uL (ref 0.1–1.0)
Monocytes Relative: 10 %
NEUTROS ABS: 6.6 10*3/uL (ref 1.7–7.7)
NEUTROS ABS: 7.6 10*3/uL (ref 1.7–7.7)
NEUTROS PCT: 72 %
Neutrophils Relative %: 80 %
Platelets: 130 10*3/uL — ABNORMAL LOW (ref 150–400)
Platelets: 128 10*3/uL — ABNORMAL LOW (ref 150–400)
RBC: 2.83 MIL/uL — AB (ref 3.87–5.11)
RBC: 2.93 MIL/uL — ABNORMAL LOW (ref 3.87–5.11)
RDW: 18.7 % — ABNORMAL HIGH (ref 11.5–15.5)
RDW: 19.2 % — AB (ref 11.5–15.5)
WBC: 9.2 10*3/uL (ref 4.0–10.5)
WBC: 9.4 10*3/uL (ref 4.0–10.5)

## 2015-06-09 LAB — TYPE AND SCREEN
ABO/RH(D): O POS
Antibody Screen: NEGATIVE
UNIT DIVISION: 0
UNIT DIVISION: 0
UNIT DIVISION: 0
UNIT DIVISION: 0
UNIT DIVISION: 0
UNIT DIVISION: 0
Unit division: 0
Unit division: 0
Unit division: 0
Unit division: 0

## 2015-06-09 LAB — BLOOD GAS, ARTERIAL
ACID-BASE DEFICIT: 5.7 mmol/L — AB (ref 0.0–2.0)
BICARBONATE: 19.5 meq/L — AB (ref 20.0–24.0)
Drawn by: 276051
FIO2: 0.5
LHR: 16 {breaths}/min
O2 SAT: 98.4 %
PCO2 ART: 39.7 mmHg (ref 35.0–45.0)
PEEP: 5 cmH2O
PH ART: 7.312 — AB (ref 7.350–7.450)
Patient temperature: 98.6
TCO2: 20.7 mmol/L (ref 0–100)
VT: 450 mL
pO2, Arterial: 115 mmHg — ABNORMAL HIGH (ref 80.0–100.0)

## 2015-06-09 LAB — PREPARE PLATELET PHERESIS
UNIT DIVISION: 0
UNIT DIVISION: 0
UNIT DIVISION: 0

## 2015-06-09 LAB — GLUCOSE, CAPILLARY
GLUCOSE-CAPILLARY: 101 mg/dL — AB (ref 65–99)
Glucose-Capillary: 100 mg/dL — ABNORMAL HIGH (ref 65–99)
Glucose-Capillary: 109 mg/dL — ABNORMAL HIGH (ref 65–99)
Glucose-Capillary: 117 mg/dL — ABNORMAL HIGH (ref 65–99)
Glucose-Capillary: 119 mg/dL — ABNORMAL HIGH (ref 65–99)
Glucose-Capillary: 96 mg/dL (ref 65–99)

## 2015-06-09 LAB — HEMOGLOBIN AND HEMATOCRIT, BLOOD
HCT: 27.5 % — ABNORMAL LOW (ref 36.0–46.0)
Hemoglobin: 9.3 g/dL — ABNORMAL LOW (ref 12.0–15.0)

## 2015-06-09 LAB — RENAL FUNCTION PANEL
ALBUMIN: 2.1 g/dL — AB (ref 3.5–5.0)
ANION GAP: 6 (ref 5–15)
BUN: 10 mg/dL (ref 6–20)
CO2: 24 mmol/L (ref 22–32)
CREATININE: 1.37 mg/dL — AB (ref 0.44–1.00)
Calcium: 7.8 mg/dL — ABNORMAL LOW (ref 8.9–10.3)
Chloride: 119 mmol/L — ABNORMAL HIGH (ref 101–111)
GFR calc non Af Amer: 35 mL/min — ABNORMAL LOW (ref >60)
GFR, EST AFRICAN AMERICAN: 41 mL/min — AB (ref >60)
GLUCOSE: 111 mg/dL — AB (ref 65–99)
POTASSIUM: 3.4 mmol/L — AB (ref 3.5–5.1)
Phosphorus: 3.3 mg/dL (ref 2.5–4.6)
SODIUM: 149 mmol/L — AB (ref 135–145)

## 2015-06-09 LAB — PREPARE FRESH FROZEN PLASMA
UNIT DIVISION: 0
UNIT DIVISION: 0
UNIT DIVISION: 0
Unit division: 0

## 2015-06-09 LAB — FIBRINOGEN: FIBRINOGEN: 433 mg/dL (ref 204–475)

## 2015-06-09 LAB — PROTIME-INR
INR: 1.24 (ref 0.00–1.49)
INR: 1.3 (ref 0.00–1.49)
Prothrombin Time: 15.3 seconds — ABNORMAL HIGH (ref 11.6–15.2)
Prothrombin Time: 15.9 seconds — ABNORMAL HIGH (ref 11.6–15.2)

## 2015-06-09 LAB — MAGNESIUM: MAGNESIUM: 2.1 mg/dL (ref 1.7–2.4)

## 2015-06-09 LAB — APTT
APTT: 34 s (ref 24–37)
APTT: 32 s (ref 24–37)

## 2015-06-09 LAB — CULTURE, BLOOD (ROUTINE X 2): CULTURE: NO GROWTH

## 2015-06-09 MED ORDER — POTASSIUM CHLORIDE 10 MEQ/50ML IV SOLN
10.0000 meq | INTRAVENOUS | Status: AC
  Administered 2015-06-09 (×4): 10 meq via INTRAVENOUS
  Filled 2015-06-09 (×4): qty 50

## 2015-06-09 MED ORDER — DEXTROSE-NACL 5-0.45 % IV SOLN: INTRAVENOUS | Status: DC

## 2015-06-09 MED ORDER — TRACE MINERALS CR-CU-MN-SE-ZN 10-1000-500-60 MCG/ML IV SOLN
INTRAVENOUS | Status: AC
  Administered 2015-06-09: 18:00:00 via INTRAVENOUS
  Filled 2015-06-09: qty 960

## 2015-06-09 MED ORDER — SODIUM CHLORIDE 0.9 % IV BOLUS (SEPSIS)
1000.0000 mL | Freq: Once | INTRAVENOUS | Status: AC
  Administered 2015-06-09: 1000 mL via INTRAVENOUS

## 2015-06-09 NOTE — Progress Notes (Signed)
Initial Nutrition Assessment  DOCUMENTATION CODES:   Non-severe (moderate) malnutrition in context of acute illness/injury, Obesity unspecified  INTERVENTION:  - TPN per pharmacy - Monitor magnesium, potassium, and phosphorus daily for at least 3 days, MD to replete as needed, as pt is at risk for refeeding syndrome. - RD will continue to monitor for POC and nutrition-related needs  NUTRITION DIAGNOSIS:   Inadequate oral intake related to inability to eat as evidenced by NPO status.  GOAL:   Patient will meet greater than or equal to 90% of their needs  MONITOR:   Weight trends, Labs, Skin, I & O's, Other (Comment) (TPN regimen)  REASON FOR ASSESSMENT:   Consult New TPN/TNA  ASSESSMENT:   82 year old with past mental history of hypothyroidism, hypertension, hyperlipidemia, depression. Admitted today to the ED after she had a fall at home. She [facility] with her husband. When she stood up to use the bathroom her legs gave out and she fell on the right side. She hurt her leg and chest. She did not hit her head and there was no loss of consciousness. She stayed on the floor the whole night. When her son arrived next morning she was found to be lying in a large pool of melanotic stool. In the ED she was found to be hypertensive with hemoglobin of 7.4 (baseline unknown).   Pt seen for consult. BMI indicates obesity. Weight has significantly trended up since admission and admission weight of 203 lbs (92.5 kg) used to calculate nutrition needs in light of stage 3 pressure injury. Pt has been NPO except for ~22 hours starting 4/5 during which time pt states she consumed jello and chicken broth. Pt denies abdominal pain or nausea this AM but complains of dry mouth and is requesting water and ginger ale; explained to pt why these are unable to be provided at this time.   Pt states that PTA she typically had a good appetite but that starting a few weeks ago her  appetite diminished and she was not eating well. Pt unsure of weight hx PTA. Per chart review, no weight PTA other than from 06/28/11 when pt weighed 191 lbs. As mentioned, weight has significantly trended up since admission. No muscle or fat wasting noted, mild edema noted.   Pt is POD #4 GDA microcoil embo per IR and POD #3 oversew of this site. Not able to meet needs and plan to initiate TPN. Per pharmacy note today, plan to initiate Clinimix E 5/15 @ 40 mL/hr with no lipids at 1800 today. Medications reviewed. IVF: D5-1/2 NS @ 50 mL/hr (204 kcal). Labs reviewed; CBGs: 73-117 mg/dL, Na: 098149 mmol/L, K: 3.4 mmol/L, Cl: 119 mmol/L, creatinine elevated and trending up, Ca: 7.8 mg/dL, GFR: 35.   Diet Order:  Diet NPO time specified TPN (CLINIMIX-E) Adult  Skin:  Wound (see comment) (Stage 3 L foot pressure injury, Abdominal incision from 06/06/15)  Last BM:  4/7  Height:   Ht Readings from Last 1 Encounters:  06/01/15 5\' 5"  (1.651 m)    Weight:   Wt Readings from Last 1 Encounters:  06/09/15 240 lb 15.4 oz (109.3 kg)    Ideal Body Weight:  56.82 kg (kg)  BMI:  Body mass index is 40.1 kg/(m^2).  Estimated Nutritional Needs:   Kcal:  1850-2035 (20-22 kcal/kg)  Protein:  110-120 grams  Fluid:  1.9-2.1 L/day  EDUCATION NEEDS:   No education needs identified at this time     Jasmine Newman  Ceasar Mons, RD, LDN Inpatient Clinical Dietitian Pager # [phone] After hours/weekend pager # [phone]

## 2015-06-09 NOTE — Progress Notes (Signed)
PARENTERAL NUTRITION CONSULT NOTE - INITIAL  Pharmacy Consult for TPN Indication: prolonged NPO, s/p duodenal ulcer oversewing  No Known Allergies  Patient Measurements: Height:  (165.1 cm) Weight: 240 lb 15.4 oz (109.3 kg) IBW/kg (Calculated) : 57 Adjusted Body Weight: 77.9 BMI 40  Vital Signs: Temp: 97.2 F (36.2 C) (04/10 0800) Temp Source: Core (Comment) (04/10 0400) BP: 133/82 mmHg (04/10 0800) Pulse Rate: 72 (04/10 0800) Intake/Output from previous day: 04/09 0701 - 04/10 0700 In: 3529 [I.V.:2764; Blood:315; IV Piggyback:450] Out: 1595 [Urine:1400; Drains:195] Intake/Output from this shift: Total I/O In: -  Out: 175 [Urine:175]  Labs:  Recent Labs  06/06/15 1623  06/07/15 1740 06/08/15 0544 06/08/15 1400 06/09/15 0430  WBC 7.8  < > 10.0 9.4  --  9.2  HGB 7.0*  < > 7.2* 7.0* 9.3* 8.5*  HCT 20.4*  < > 20.6* 20.0* 27.5* 24.7*  PLT 98*  99*  < > 122* 119*  --  130*  APTT 33  --   --   --   --  34  INR 1.63*  < > 1.27 1.23  --  1.24  < > = values in this interval not displayed.   Recent Labs  06/06/15 1623  06/07/15 0200 06/08/15 0544 06/09/15 0430  NA 146*  < > 149*  148* 151* 149*  K 4.0  < > 3.6  3.5 3.3* 3.4*  CL 119*  --  118*  118* 118* 119*  CO2 21*  --  GLUCOSE 132*  --  108*  107* 89 111*  BUN 13  --  CREATININE 1.28*  --  1.27*  1.32* 1.27* 1.37*  CALCIUM 7.0*  --  7.8*  7.7* 7.7* 7.8*  MG  --   --  1.7 1.7 2.1  PHOS  --   --  3.2 3.1 3.3  PROT 3.4*  --   --   --   --   ALBUMIN 1.7*  --  2.1* 1.9* 2.1*  AST 18  --   --   --   --   ALT 16  --   --   --   --   ALKPHOS 35*  --   --   --   --   BILITOT 0.5  --   --   --   --   < > = values in this interval not displayed. Estimated Creatinine Clearance: 39.6 mL/min (by C-G formula based on Cr of 1.37).    Recent Labs  06/09/15 0013 06/09/15 0422 06/09/15 0718  GLUCAP 109* 101* 117*    Medical History: Past Medical History  Diagnosis Date   . Hypertension     Medications:  Infusions:  . dextrose 5 % and 0.45% NaCl 75 mL/hr at 06/08/15 2340  . pantoprozole (PROTONIX) infusion 8 mg/hr (06/09/15 0306)    Insulin Requirements in the past 24 hours: None  Current Nutrition: NPO  IVF: D5 0.45% NaCl @ 75 ml/hr  Central access: Rt IJ CVL (4/2) TPN start date: 4/10  ASSESSMENT  HPI: [age] yoF admitted on 4/2 with GIB and hypotension; s/p failed arterial embolization 4/6, requiring surgical oversewing of bleeding duodenal ulcer 06/06/15. She remains NPO and has been since admission with malnutrition.  Surgery may allow PO intake 5 days postop (today is day 3).  Pharmacy has been consulted to dose TPN.  Significant events:   Today:   Glucose: CBGs: 73-117  Electrolytes: High Na and Cl.  K low (KCl replacement 4/10 per MD).  Corr Ca 9.3, Mag (s/p Mag bolus 4/9), and Phos WNL.  Renal:  SCr elevated and increased  LFTs: WNL (4/7)  TGs:  pending  Prealbumin: pending  NUTRITIONAL GOALS                                                                                             RD recs: pending  RPh estimate goals:  1200-1526 kcal/day (11-14 kcal/kg actual wt for BMI 30-50), and 114 g protein (2 g/kg IBW) Based on published guidelines, while the patient meets ICU status the initiation of lipids is being delayed for 7 days or until transition out of ICU. Planned start date for lipids is 4/17.  Goal will be to meet 100% of the patient's protein needs and approximately 80% of their caloric needs.  Clinimix 5/15 @ 83 mL/hr to provide 100 grams protein (88% of goal) and 1414 kcal (100% of minimum goal)    PLAN                                                                                                                         At 1800 today:  Start Clinimix E 5/15 at 40 ml/hr.  HOLD lipids (may start on 4/17 or when no  longer ICU status)   Plan to advance as tolerated to the goal rate.  TPN to contain standard multivitamins and trace elements.  Reduce IVF to 35 ml/hr.  Add sensitive SSI and CBGs q8h  TPN lab panels on Mondays & Thursdays.  F/u daily.  Lynann Beaverhristine Zykiria Bruening PharmD, BCPS Pager [phone] 06/09/2015 9:03 AM

## 2015-06-09 NOTE — Progress Notes (Signed)
[facility]:  [phone] 220 1515 General Surgery Progress Note   LOS: 8 days  POD -  3 Days Post-Op  Assessment/Plan: 1.  EXPLORATORY LAPAROTOMY OVERSEWING OF BLEEDING DUODENAL ULCER - 06/06/2015 - [place]  On ampicillin  Will remove NGT, but keep NPO.  Will probably not start PO's until post op day 5  2.  Extubated 3.  NSTEMI (non-ST elevated myocardial infarction)  4.  Anemia -   Hgb - 8.5 - 06/09/2015 5.  Creat  1.37 - 06/09/2015 6.  Clean ulcer left heel 7.  Bedridden at home  Sits up, but cannot walkd 8.  Malnourished  Albumin - 1.7 from 06/06/2015  To start TPN 9.  DVT prophylaxis - on hold because of bleed   Active Problems:   GI bleed   Pressure ulcer   Gastrointestinal hemorrhage associated with duodenal ulcer   Hypovolemic shock (HCC)   Bacteremia   NSTEMI (non-ST elevated myocardial infarction) (HCC)   Subjective:  Pleasant older lady who does not feel good.  She wants something to drink.  Objective:   Filed Vitals:   06/09/15 0700 06/09/15 0800  BP: 149/63 133/82  Pulse: 64 72  Temp: 97.2 F (36.2 C) 97.2 F (36.2 C)  Resp: 13 14     Intake/Output from previous day:  04/09 0701 - 04/10 0700 In: 3529 [I.V.:2764; Blood:315; IV Piggyback:450] Out: 1595 [Urine:1400; Drains:195]  Intake/Output this shift:      Physical Exam:   General: Obese older WF who is alert and oriented.    HEENT: Normal. Pupils equal.  Has NGT in place, but I think it is a little far out .   Lungs: Clear   Abdomen: Soft.  Has BS.   Wound: Clean.  Drain in RUQ - 195 cc recorded last 24 hours   Lab Results:    Recent Labs  06/08/15 0544 06/08/15 1400 06/09/15 0430  WBC 9.4  --  9.2  HGB 7.0* 9.3* 8.5*  HCT 20.0* 27.5* 24.7*  PLT 119*  --  130*    BMET   Recent Labs  06/08/15 0544 06/09/15 0430  NA 151* 149*  K 3.3* 3.4*  CL 118* 119*  CO2 24 24  GLUCOSE 89 111*  BUN 11 10  CREATININE 1.27* 1.37*  CALCIUM 7.7* 7.8*    PT/INR   Recent  Labs  06/08/15 0544 06/09/15 0430  LABPROT 15.2 15.3*  INR 1.23 1.24    ABG   Recent Labs  06/06/15 1647 06/06/15 2045  PHART 7.360 7.358  HCO3 21.4 20.8     Studies/Results:  No results found.   Anti-infectives:   Anti-infectives    Start     Dose/Rate Route Frequency Ordered Stop   06/06/15 1400  ampicillin (OMNIPEN) 2 g in sodium chloride 0.9 % 50 mL IVPB     2 g 150 mL/hr over 20 Minutes Intravenous Every 6 hours 06/06/15 0959     06/04/15 1000  vancomycin (VANCOCIN) 1,250 mg in sodium chloride 0.9 % 250 mL IVPB  Status:  Discontinued     1,250 mg 166.7 mL/hr over 90 Minutes Intravenous Every 24 hours 06/04/15 0901 06/04/15 0928   06/04/15 1000  ampicillin (OMNIPEN) 2 g in sodium chloride 0.9 % 50 mL IVPB  Status:  Discontinued     2 g 150 mL/hr over 20 Minutes Intravenous 6 times per day 06/04/15 0929 06/06/15 0959   06/02/15 0630  cefTRIAXone (ROCEPHIN) 2 g in dextrose 5 % 50 mL IVPB  Status:  Discontinued     2 g 100 mL/hr over 30 Minutes Intravenous Daily 06/02/15 0613 06/04/15 0928   06/02/15 0630  vancomycin (VANCOCIN) IVPB 1000 mg/200 mL premix  Status:  Discontinued     1,000 mg 200 mL/hr over 60 Minutes Intravenous Daily 06/02/15 0619 06/04/15 0901   06/02/15 0600  ampicillin-sulbactam (UNASYN) 1.5 g in sodium chloride 0.9 % 50 mL IVPB  Status:  Discontinued     1.5 g 100 mL/hr over 30 Minutes Intravenous Every 12 hours 06/01/15 1555 06/02/15 0613   06/01/15 1700  ampicillin-sulbactam (UNASYN) 1.5 g in sodium chloride 0.9 % 50 mL IVPB     1.5 g 100 mL/hr over 30 Minutes Intravenous  Once 06/01/15 1554 06/01/15 1726      Ovidio Kin, MD, FACS Pager: [phone] [facility]: [phone] 06/09/2015

## 2015-06-09 NOTE — Progress Notes (Signed)
PULMONARY / CRITICAL CARE MEDICINE   Name: Jasmine Newman MRN: [id number] DOB: [birth year]    ADMISSION DATE:  06/01/2015 CONSULTATION DATE:  06/01/15  REFERRING MD:  ED  CHIEF COMPLAINT:  GI bleed, hypotension  HISTORY OF PRESENT ILLNESS:   82 year old with past mental history of hypothyroidism, hypertension, hyperlipidemia, depression. Admitted today to the ED after she had a fall at home. She is watching the [facility] basketball game with her husband. When she stood up to use the bathroom her legs gave out and she fell on the right side. She hurt her leg and chest. She did not hit her head and there was no loss of consciousness. She stayed on the floor the whole night. When her son arrived next morning she was found to be lying in a large pool of melanotic stool. In the ED she was found to be hypertensive with hemoglobin of 7.4 (baseline unknown). She continued to be hypotensive in spite of 2 L of fluid. GI evaluated the patient. PCCM called for admission.  SUBJECTIVE: Pt reports chronic LE pain.  Denies abd pain.  No distress.  Hypoglycemia & nausea overnight.    VITAL SIGNS: BP 133/82 mmHg  Pulse 72  Temp(Src) 97.2 F (36.2 C) (Core (Comment))  Resp 14  Ht  (1.651 m)  Wt 240 lb 15.4 oz (109.3 kg)  BMI 40.10 kg/m2  SpO2 100%  LMP  (LMP Unknown)  HEMODYNAMICS: CVP:  [8 mmHg-18 mmHg] 14 mmHg  VENTILATOR SETTINGS:    INTAKE / OUTPUT: I/O last 3 completed shifts: In: 4471.7 [I.V.:3606.7; Blood:315; IV Piggyback:550] Out: 2225 [Urine:1850; Drains:375]  PHYSICAL EXAMINATION: General:  Awake. No distress. No family at bedside. Neuro: Following commands. Nods appropriately. Nonfocal. HEENT: MM pink/moist. No JVD. No scleral icterus or injection. Cardiovascular:  Sinus rhythm with regular rate.   Lungs:  Even/non-labored, soft wheeze on left, coarse on R  Abdomen: Soft. Normal bowel sounds. Drain in place. Integument:  Warm & dry. No rash on exposed  skin.  LABS:  BMET  Recent Labs Lab 06/07/15 0200 06/08/15 0544 06/09/15 0430  NA 149*  148* 151* 149*  K 3.6  3.5 3.3* 3.4*  CL 118*  118* 118* 119*  CO2 BUN CREATININE 1.27*  1.32* 1.27* 1.37*  GLUCOSE 108*  107* 89 111*    Electrolytes  Recent Labs Lab 06/07/15 0200 06/08/15 0544 06/09/15 0430  CALCIUM 7.8*  7.7* 7.7* 7.8*  MG 1.7 1.7 2.1  PHOS 3.2 3.1 3.3    CBC  Recent Labs Lab 06/07/15 1740 06/08/15 0544 06/08/15 1400 06/09/15 0430  WBC 10.0 9.4  --  9.2  HGB 7.2* 7.0* 9.3* 8.5*  HCT 20.6* 20.0* 27.5* 24.7*  PLT 122* 119*  --  130*    Coag's  Recent Labs Lab 06/06/15 0330 06/06/15 1623  06/07/15 1740 06/08/15 0544 06/09/15 0430  APTT 33 33  --   --   --  34  INR 1.51* 1.63*  < > 1.27 1.23 1.24  < > = values in this interval not displayed.  Sepsis Markers No results for input(s): LATICACIDVEN, PROCALCITON, O2SATVEN in the last 168 hours.  ABG  Recent Labs Lab 06/03/15 0401 06/06/15 1647 06/06/15 2045  PHART 7.332* 7.360 7.358  PCO2ART 40.0 37.0 37.0  PO2ART 82.4 247.0* 89.2    Liver Enzymes  Recent Labs Lab 06/06/15 1623 06/07/15 0200 06/08/15 0544 06/09/15 0430  AST 18  --   --   --  ALT 16  --   --   --   ALKPHOS 35*  --   --   --   BILITOT 0.5  --   --   --   ALBUMIN 1.7* 2.1* 1.9* 2.1*    Cardiac Enzymes  Recent Labs Lab 06/02/15 1656 06/03/15 1800 06/04/15 0400  TROPONINI 3.27* 1.26* 1.27*    Glucose  Recent Labs Lab 06/08/15 1149 06/08/15 1540 06/08/15 1932 06/09/15 0013 06/09/15 0422 06/09/15 0718  GLUCAP 91 97 100* 109* 101* 117*    Imaging No results found.  STUDIES:  X ray rib 4/2: No fractures Port CXR 4/2:  Questionable right basal hazy opacity. R IJ CVL in mid-SVC. Chronic elevation left hemidiaphragm.  R Knee 2View 4/2: No fracture or dislocation. Port CXR 4/4:  Silhouetting of left hemidiaphragm unchanged. Endotracheal tube and central venous  catheter in good position. No new focal opacity. TTE 4/4: LV normal in size with EF 45-50%. Akinesis of anterior septal myocardium. Grade 2 diastolic dysfunction. LA & RA normal in size. RV normal in size and function. PASP 40mmHg. Moderate aortic stenosis w/ bioprosthetic aortic valve without regurgitation. Severe mitral vegetation without stenosis. Trivial pulmonic regurgitation without stenosis. Moderate tricuspid regurgitation. Port CXR 4/8: Endotracheal tube and central line in good position. Low lung volumes. Hazy opacification bilaterally.  CULTURES: Blood Ctx x2 4/5:  Enterococcus 1/2 bottles >> Blood Ctx x2 4/2 >> Enterococcus 1/2 bottles Urine Ctx x2 4/2 >> Enterococcus 2/2 cultures MRSA PCR 4/2:  Negative    ANTIBIOTICS: Ampicillin 4/5>>> Vancomycin 4/3 - 4/5 Rocephin 4/3 - 4/5 Unasyn 4/2 >>  SIGNIFICANT EVENTS: 4/2 - Admit 4/3 - Intubation & EGD 4/4 - Extubation 4/6 - Successful GDA microcoil embo per IR 4/7 - Repeat bleeding >> taken to IR but unsuccessful >> taken to OR & oversewen 4/8 - Hypoglycemia  4/10 - TPN initiated   LINES/TUBES: OETT 4/3 - 4/4; 7.5 4/7 >> 4/9 R Abd Drain 4/7 >> OGT 4/7 >> R IJ CVL 4/2 >> Foley 4/2 >>   ASSESSMENT / PLAN:  PULMONARY A: Possible RLL Pneumonia Acute Hypoxic Respiratory Failure - Likely secondary to anemia & IV blood/fluid resuscitation. Intubated for EGD 4/3 then extubated 4/4. Intubated for OR on 4/7.  P:   Pulmonary hygiene - IS, mobilize Weaning FiO2 for saturation >92%  CARDIOVASCULAR A:  Shock - Intermittent. Sepsis vs GIB. Moderate Aortic Stenosis / Severe Mitral Regurg - Seen on TTE 4/4. Demand Ischemia 2/2 anemia NSTEMI - Possible MI given wall motion abnormality on TTE 4/4. Atrial Fibrillation - Intermittent. H/O HTN H/O Hyperlipdemia H/O Bioprosthetic Aortic Valve   P:  No systemic anticoagulation given GIB Monitor on telemetry Vitals per unit protocol Holding Lisinopril & Lopressor Plan for  Cardiology Consult when patient stabilizes TEE for possible endocarditis pending    RENAL A:   Acute Renal Failure - Stable. Hypomagnesemia - Resolved.  Hypokalemia - Mild. Hypernatremia - Mild.  P:   KCl 10mEq IV x4 runs D5 1/2 NS MIVF @ 75cc/hr Trending UOP with Foley Monitor daily renal function & electrolytes Replace electrolytes as indicated  GASTROINTESTINAL A:   Acute GIB - Secondary to Duodenal Ulcer on EGD 4/3. Failed embolization 4/6. Surgery 4/7. Protein Calorie Malnutrition  P:   General Surgery Following Continue Protonix gtt Begin TNA Anticipate CCS may begin PO's in next 48 hours NPO  HEMATOLOGIC A:   Anemia - Secondary to GIB. S/P multiple PRBC & FFP. Thrombocytopenia - Mild & stable. Likely consumption. S/P 1u  Platelets 4/8. Coagulopathy - Dilution vs Consumption.  P:  Trend daily CBC Trend Coags daily Goal Hgb >8.0 w/ MI SCDs  INFECTIOUS A:   Sepsis - Secondary to UTI & Bacteremia Enterococcus Bacteremia - Persistent. Enterococcus UTI Left Heel Decubitus Ulcer - present on admission  P:   ID Following & appreciate recommendations Repeat Cultures persistently positive Ampicillin Day #9 Procalcitonin per algorithm Needs TEE - ordered but unable to do on weekends, pending 4/10 WOC for wound needs  ENDOCRINE A:   Intermittent Hypoglycmemia Questionable Adrenal Insufficiency H/O Hypothyroidism  P:   Synthroid IV daily D5 1/2 NS MIVF Accu-Checks q4hr SSI per algorithm Holding on steroid therapy at this time given duodenal ulcer  NEUROLOGIC A:   Pain in Right Knee - No fracture of X-ray. H/O Depression Bedridden at Home / Deconditioning   P:   Fentanyl IV prn Hold Wellbutrin XL & Cymbalta  FAMILY UPDATES:  No family at bedside 4/10.  Patient updated on plan of care  TODAY'S SUMMARY:  An 82 year old status post fall with non-STEMI, bleeding duodenal ulcer, & persistent enterococcus bacteremia with bioprosthetic valve.  Continuing broad-spectrum antibiotics. Status post failed embolization with interventional radiology and now status post surgical intervention for bleeding ulcer. Continuing Protonix drip. No evidence of active bleeding. Trending Hgb daily for now to minimize blood draws. TEE pending for persistent bacteremia & will need repeat blood cultures in the next day or so.    Canary Brim, NP-C [facility] Pgr: [phone] or if no answer [phone] 06/09/2015, 8:33 AM

## 2015-06-09 NOTE — Progress Notes (Signed)
CCm notified of bloody BM. RN to draw labs and page GI. GI paged [phone]. Will continue to monitor.

## 2015-06-09 NOTE — Progress Notes (Signed)
eLink Physician-Brief Progress Note Patient Name: Jasmine Newman DOB: 28-Jun-1931 MRN: [id number]   Date of Service  06/09/2015  HPI/Events of Note  Hypotensive.  eICU Interventions  Will give 1 liter NS fluid bolus.     Intervention Category Major Interventions: Other:  [name] 06/09/2015, 1:05 AM

## 2015-06-09 NOTE — Progress Notes (Signed)
Patient had moderate sized bright red liquid BM. MD notified. RN to monitor.

## 2015-06-09 NOTE — Progress Notes (Signed)
    [facility]   Reason for visit: Follow up on Enterococcal bacteremia  Interval History: now extubated, reopeat blood culture positive.  Asking for something to drink.    Physical Exam: Constitutional: up in chair Filed Vitals:   06/09/15 1200 06/09/15 1300  BP: 145/69   Pulse: 69 73  Temp: 96.8 F (36 C) 97 F (36.1 C)  Resp: 15 15  CARDIOVASCULAR: Tachy RR Skin: no rashes Resp: + rhconchi  Impression: Enterococcus, likely still GI source with ongoing bleed.  S/p OR.    Plan: 1.  Continue ampicillin.   Repeat blood cultures TEE when able 2. Bleed - may have been iniial source of #1.

## 2015-06-09 NOTE — Plan of Care (Signed)
Problem: Activity: Goal: Risk for activity intolerance will decrease Outcome: Not Progressing Patient refusing to mobilize.

## 2015-06-09 NOTE — Progress Notes (Signed)
eLink Physician-Brief Progress Note Patient Name: Jasmine PonsBetty G Andersson DOB: 1931/10/31 MRN: [id number]   Date of Service  06/09/2015  HPI/Events of Note  BRB per rectum - Already on a Protonix IV infusion. Know DU with clot on prior EGD.  eICU Interventions  Will order: 1. CBC, PT/INR and PTT now.  2. Bedside nurse asked to inform GI of BRB per rectum as well as she has a know DU.     Intervention Category Intermediate Interventions: Other:  Lenell AntuSommer,Steven Eugene 06/09/2015, 7:42 PM

## 2015-06-10 DIAGNOSIS — E440 Moderate protein-calorie malnutrition: Secondary | ICD-10-CM | POA: Insufficient documentation

## 2015-06-10 LAB — COMPREHENSIVE METABOLIC PANEL
ALBUMIN: 2.2 g/dL — AB (ref 3.5–5.0)
ALK PHOS: 65 U/L (ref 38–126)
ALT: 16 U/L (ref 14–54)
ANION GAP: 8 (ref 5–15)
AST: 23 U/L (ref 15–41)
BILIRUBIN TOTAL: 0.8 mg/dL (ref 0.3–1.2)
BUN: 11 mg/dL (ref 6–20)
CALCIUM: 8 mg/dL — AB (ref 8.9–10.3)
CO2: 23 mmol/L (ref 22–32)
CREATININE: 1.24 mg/dL — AB (ref 0.44–1.00)
Chloride: 121 mmol/L — ABNORMAL HIGH (ref 101–111)
GFR calc Af Amer: 46 mL/min — ABNORMAL LOW (ref >60)
GFR calc non Af Amer: 40 mL/min — ABNORMAL LOW (ref >60)
GLUCOSE: 127 mg/dL — AB (ref 65–99)
Potassium: 3.7 mmol/L (ref 3.5–5.1)
Sodium: 152 mmol/L — ABNORMAL HIGH (ref 135–145)
TOTAL PROTEIN: 5.4 g/dL — AB (ref 6.5–8.1)

## 2015-06-10 LAB — CBC WITH DIFFERENTIAL/PLATELET
BASOS PCT: 0 %
Basophils Absolute: 0 10*3/uL (ref 0.0–0.1)
Eosinophils Absolute: 0.2 10*3/uL (ref 0.0–0.7)
Eosinophils Relative: 3 %
HEMATOCRIT: 26.8 % — AB (ref 36.0–46.0)
HEMOGLOBIN: 9 g/dL — AB (ref 12.0–15.0)
Lymphocytes Relative: 11 %
Lymphs Abs: 1 10*3/uL (ref 0.7–4.0)
MCH: 30 pg (ref 26.0–34.0)
MCHC: 33.6 g/dL (ref 30.0–36.0)
MCV: 89.3 fL (ref 78.0–100.0)
MONOS PCT: 8 %
Monocytes Absolute: 0.7 10*3/uL (ref 0.1–1.0)
NEUTROS ABS: 7.2 10*3/uL (ref 1.7–7.7)
NEUTROS PCT: 78 %
Platelets: 133 10*3/uL — ABNORMAL LOW (ref 150–400)
RBC: 3 MIL/uL — ABNORMAL LOW (ref 3.87–5.11)
RDW: 19 % — ABNORMAL HIGH (ref 11.5–15.5)
WBC: 9.1 10*3/uL (ref 4.0–10.5)

## 2015-06-10 LAB — PREALBUMIN: Prealbumin: 11.5 mg/dL — ABNORMAL LOW (ref 18–38)

## 2015-06-10 LAB — GLUCOSE, CAPILLARY
GLUCOSE-CAPILLARY: 113 mg/dL — AB (ref 65–99)
Glucose-Capillary: 110 mg/dL — ABNORMAL HIGH (ref 65–99)
Glucose-Capillary: 123 mg/dL — ABNORMAL HIGH (ref 65–99)
Glucose-Capillary: 110 mg/dL — ABNORMAL HIGH (ref 65–99)
Glucose-Capillary: 120 mg/dL — ABNORMAL HIGH (ref 65–99)

## 2015-06-10 LAB — APTT: APTT: 33 s (ref 24–37)

## 2015-06-10 LAB — PROTIME-INR
INR: 1.27 (ref 0.00–1.49)
Prothrombin Time: 15.6 seconds — ABNORMAL HIGH (ref 11.6–15.2)

## 2015-06-10 LAB — TRIGLYCERIDES: TRIGLYCERIDES: 134 mg/dL (ref <150)

## 2015-06-10 LAB — MAGNESIUM: Magnesium: 2.1 mg/dL (ref 1.7–2.4)

## 2015-06-10 LAB — PHOSPHORUS: Phosphorus: 3.2 mg/dL (ref 2.5–4.6)

## 2015-06-10 LAB — FIBRINOGEN: FIBRINOGEN: 427 mg/dL (ref 204–475)

## 2015-06-10 MED ORDER — DEXTROSE 5 % IV SOLN
INTRAVENOUS | Status: AC
  Administered 2015-06-10: 09:00:00 via INTRAVENOUS

## 2015-06-10 MED ORDER — IPRATROPIUM-ALBUTEROL 0.5-2.5 (3) MG/3ML IN SOLN
3.0000 mL | Freq: Four times a day (QID) | RESPIRATORY_TRACT | Status: DC | PRN
  Administered 2015-06-15: 3 mL via RESPIRATORY_TRACT
  Filled 2015-06-10: qty 3

## 2015-06-10 MED ORDER — FUROSEMIDE 10 MG/ML IJ SOLN
20.0000 mg | Freq: Two times a day (BID) | INTRAMUSCULAR | Status: DC
  Administered 2015-06-10 (×2): 20 mg via INTRAVENOUS
  Filled 2015-06-10 (×2): qty 2

## 2015-06-10 MED ORDER — TRACE MINERALS CR-CU-MN-SE-ZN 10-1000-500-60 MCG/ML IV SOLN
INTRAVENOUS | Status: AC
  Administered 2015-06-10: 17:00:00 via INTRAVENOUS
  Filled 2015-06-10: qty 1440

## 2015-06-10 MED ORDER — INSULIN ASPART 100 UNIT/ML ~~LOC~~ SOLN
0.0000 [IU] | Freq: Three times a day (TID) | SUBCUTANEOUS | Status: DC
  Administered 2015-06-11 – 2015-06-14 (×10): 1 [IU] via SUBCUTANEOUS

## 2015-06-10 MED ORDER — DEXTROSE 5 % IV SOLN
INTRAVENOUS | Status: AC
  Administered 2015-06-10: 19:00:00 via INTRAVENOUS

## 2015-06-10 NOTE — Progress Notes (Addendum)
Jasmine Newman 8:58 AM  Subjective: Case discussed last night with the nurse and this morning with the patient and the nurse and she has not had any further bleeding and she wants to drink and has no new complaints and her hospital computer chart was reviewed  Objective: Vital signs stable afebrile bilateral rhonchi decreased heart sounds abdomen is soft minimal discomfort rare bowel sound hemoglobin stable BUN stable  Assessment: Recurrent bleeding questionable significance questionably old blood  Plan: Will be on standby to assist this week if endoscopy requested by surgical team otherwise will check on tomorrow  [facility]Sherlie Boyum E  Pager [phone] After 5PM or if no answer call [phone]

## 2015-06-10 NOTE — Progress Notes (Signed)
Asked to speak with patient re: TEE for bacteremia.  Pt currently does not wish to have the procedure done, but she will consider it if her husband feels it is needed. Will discuss this with him when he is available.   Currently, respiratory status will not permit the procedure.  Tentatively scheduled for 04/13, can move further out or cancel if needed. Will write orders once final decision made.  Barrett, Rhonda, PA-C 06/10/2015 9:25 AM Beeper [phone]  

## 2015-06-10 NOTE — Progress Notes (Signed)
Patient had another massive liquid dark bloody BM. GI re-paged. RN to continue to monitor.

## 2015-06-10 NOTE — Progress Notes (Addendum)
[facility] PROGRESS NOTE  Jasmine Newman [place] JAS:[id number] DOB: 1931/09/24 DOA: 06/01/2015 PCP: No primary care provider on file.  Interim summary/brief H&P 82 year old with past mental history of hypothyroidism, hypertension, hyperlipidemia, depression. Admitted today to the ED after she had a fall at home. She is watching the [employer] basketball game with her husband. When she stood up to use the bathroom her legs gave out and she fell on the right side. She hurt her leg and chest. She did not hit her head and there was no loss of consciousness. She stayed on the floor the whole night. When her son arrived next morning she was found to be lying in a large pool of melanotic stool. In the ED she was found to be hypertensive with hemoglobin of 7.4 (baseline unknown). She continued to be hypotensive despite of 2 L of fluid. GI evaluated the patient. PCCM called for admission. Patient ended experiencing need for intubation and pressors; initially failed  IR approach for embolization and subsequent required surgery. Hemodynamically stable and with good O2 sat currently. Course complicated with enterococcus bacteremia and either NSTEMI vs demand ischemia. Needs TEE. ID, CCS and cardiology on board.  Assessment/Plan: 1-resp failure with hypoxia: required intubation until 06/08/15 -breathing is stable -very crackly on exam -repeat CXR in am -will give couple doses of IVF's (patient with signs of fluid overload) -continue pulmonary toiletry -flutter valve/IS  2-shock: sepsis vs secondary to GIB -resolved -BP stable -patient now fluid overload -will use gentle lasix and follow I's and O's  3-aortic stenosis: s/p bioprosthetic valve -needs TEE -keep good BP control  4-elevated troponin: NSTEMI -2-D echo with wall motion abnormalities -cardiology consulted  -not on ASA due to GIB; not on b-blockers or ACE, given intermittent episodes of shock and renal failure  5-GIB: secondary to duodenal  ulcer -status post surgical intervention on 06/06/15 -Hgb stable -CCS on board and dictating diet advancement -continue PPI  6-renal failure: due to shock episodes and decrease perfusion -will monitor trend -stable  7-hypernatremia, hypokalemia and low Magnesium: -follow electrolytes and replete as needed  -TPN with low sodium concentration  -low rate D5W to help with high sodium   8-acute blood loss anemia and thrombocytopenia -due to GIB, consumption and dilution component  -transfusion as needed; goal is for Hgb > 8 -follow Hgb and platelets trend -not active bleeding currently  9-Sepsis: secondary to enterococcus bacteremia -repeated cx's still positive -TEE recommended; cardiology consulted -continue ampicillin; ID on board  10-left heel ulcer: present prior to admission -follow WOC recommendations -continue prevlon boots and preventive measurements    11-depression: -no SI or hallucinations -will continue welbutrin/cymbalta once able to tolerate PO's  12-Modertae protein calorie malnutrition  -continue TPN for now  Code Status: Full Family Communication: no family at bedside  Disposition Plan: remains in stepdown; continue TPN; diet advance as per CCS rec's; cardiology to assist with TEE.   Consultants:  PCCM  CCS  ID  Cardiology (for TEE)  Procedures/studies: X ray rib 4/2: No fractures Port CXR 4/2: Questionable right basal hazy opacity. R IJ CVL in mid-SVC. Chronic elevation left hemidiaphragm.  R Knee 2View 4/2: No fracture or dislocation. Port CXR 4/4: Silhouetting of left hemidiaphragm unchanged. Endotracheal tube and central venous catheter in good position. No new focal opacity. TTE 4/4: LV normal in size with EF 45-50%. Akinesis of anterior septal myocardium. Grade 2 diastolic dysfunction. LA & RA normal in size. RV normal in size and function. PASP 20mHg. Moderate aortic stenosis w/  bioprosthetic aortic valve without regurgitation. Severe  mitral vegetation without stenosis. Trivial pulmonic regurgitation without stenosis. Moderate tricuspid regurgitation. Port CXR 4/8: Endotracheal tube and central line in good position. Low lung volumes. Hazy opacification bilaterally.  Antibiotics:  Ampicillin   HPI/Subjective: Afebrile, breathing stable; signs of fluid overload and crackles on exam. Reported some abd pain, deneis nausea/vomiting and endorses to be hungry.  Objective: Filed Vitals:   06/10/15 0600 06/10/15 0700  BP: 137/88 154/85  Pulse: 62 74  Temp: 96.8 F (36 C) 97.2 F (36.2 C)  Resp: 12 14    Intake/Output Summary (Last 24 hours) at 06/10/15 1018 Last data filed at 06/10/15 0900  Gross per 24 hour  Intake   2234 ml  Output   1465 ml  Net    769 ml   Filed Weights   06/08/15 0500 06/09/15 0431 06/10/15 0154  Weight: 106.8 kg (235 lb 7.2 oz) 109.3 kg (240 lb 15.4 oz) 108.7 kg (239 lb 10.2 oz)    Exam:   General:  Afebrile, complaining of soreness in her abd, no nausea, no vomiting. Reports some SOB, but with good O2 sat on 2L.  Cardiovascular: rate controlled currently, no rubs or gallops, positive JVD, positive SM  Respiratory: positive crackles at bases; diffuse rhonchi and mild exp wheezing  Abdomen: soft, positive BS, drain in place, mild tenderness with palpation; no guarding   Musculoskeletal: 2+ edema and left heel ulcer appreciated; no cyanosis; SCD's in place  Data Reviewed: Basic Metabolic Panel:  Recent Labs Lab 06/06/15 0200 06/06/15 1623 06/06/15 1647 06/07/15 0200 06/08/15 0544 06/09/15 0430 06/10/15 0135  NA 146* 146* 145 149*  148* 151* 149* 152*  K 3.8 4.0 3.9 3.6  3.5 3.3* 3.4* 3.7  CL 119* 119*  --  118*  118* 118* 119* 121*  CO2 23 21*  --  _0 GLUCOSE 95 132*  --  108*  107* 89 111* 127*  BUN 14 13  --  _1 CREATININE 1.31* 1.28*  --  1.27*  1.32* 1.27* 1.37* 1.24*  CALCIUM 7.4* 7.0*  --  7.8*  7.7* 7.7* 7.8* 8.0*  MG 1.9  --    --  1.7 1.7 2.1 2.1  PHOS 3.1  --   --  3.2 3.1 3.3 3.2   Liver Function Tests:  Recent Labs Lab 06/06/15 1623 06/07/15 0200 06/08/15 0544 06/09/15 0430 06/10/15 0135  AST 18  --   --   --  23  ALT 16  --   --   --  16  ALKPHOS 35*  --   --   --  65  BILITOT 0.5  --   --   --  0.8  PROT 3.4*  --   --   --  5.4*  ALBUMIN 1.7* 2.1* 1.9* 2.1* 2.2*   CBC:  Recent Labs Lab 06/07/15 1740 06/08/15 0544 06/08/15 1400 06/09/15 0430 06/09/15 2000 06/10/15 0135  WBC 10.0 9.4  --  9.2 9.4 9.1  NEUTROABS 7.4 7.0  --  6.6 7.6 7.2  HGB 7.2* 7.0* 9.3* 8.5* 8.8* 9.0*  HCT 20.6* 20.0* 27.5* 24.7* 26.0* 26.8*  MCV 86.2 87.0  --  87.3 88.7 89.3  PLT 122* 119*  --  130* 128* 133*   Cardiac Enzymes:  Recent Labs Lab 06/03/15 1800 06/04/15 0400  TROPONINI 1.26* 1.27*   BNP (last 3 results)  Recent Labs  06/01/15 2100  BNP  329.7*   CBG:  Recent Labs Lab 06/09/15 1555 06/09/15 2024 06/09/15 2353 06/10/15 0346 06/10/15 0806  GLUCAP 96 119* 123* 110* 113*    Recent Results (from the past 240 hour(s))  Urine culture     Status: None (Preliminary result)   Collection Time: 06/01/15 11:37 AM  Result Value Ref Range Status   Specimen Description URINE, CATHETERIZED  Final   Special Requests NONE  Final   Culture   Final    >=100,000 COLONIES/mL ENTEROCOCCUS SPECIES SENT TO REFERENCE LAB FOR SENSITIVITIES Performed at [facility]    Report Status PENDING  Incomplete  Culture, blood (single)     Status: None   Collection Time: 06/01/15  3:13 PM  Result Value Ref Range Status   Specimen Description BLOOD RIGHT ARM  Final   Special Requests BOTTLES DRAWN AEROBIC AND ANAEROBIC [place]  Final   Culture  Setup Time   Final    IN BOTH AEROBIC AND ANAEROBIC BOTTLES GRAM POSITIVE COCCI IN PAIRS IN CHAINS CRITICAL RESULT CALLED TO, READ BACK BY AND VERIFIED WITH: M.SCHRAMM,RN [id number] 06/02/15 M.CAMPBELL    Culture   Final    ENTEROCOCCUS SPECIES Performed at [facility]    Report Status 06/04/2015 FINAL  Final   Organism ID, Bacteria ENTEROCOCCUS SPECIES  Final      Susceptibility   Enterococcus species - MIC*    AMPICILLIN <=2 SENSITIVE Sensitive     VANCOMYCIN 1 SENSITIVE Sensitive     GENTAMICIN SYNERGY SENSITIVE Sensitive     * ENTEROCOCCUS SPECIES  Urine culture     Status: None   Collection Time: 06/01/15  4:30 PM  Result Value Ref Range Status   Specimen Description URINE, CATHETERIZED  Final   Special Requests NONE  Final   Culture   Final    >=100,000 COLONIES/mL ENTEROCOCCUS SPECIES Performed at [facility]    Report Status 06/04/2015 FINAL  Final   Organism ID, Bacteria ENTEROCOCCUS SPECIES  Final      Susceptibility   Enterococcus species - MIC*    AMPICILLIN <=2 SENSITIVE Sensitive     LEVOFLOXACIN 1 SENSITIVE Sensitive     NITROFURANTOIN 32 SENSITIVE Sensitive     VANCOMYCIN 1 SENSITIVE Sensitive     * >=100,000 COLONIES/mL ENTEROCOCCUS SPECIES  MRSA PCR Screening     Status: None   Collection Time: 06/01/15  4:37 PM  Result Value Ref Range Status   MRSA by PCR NEGATIVE NEGATIVE Final    Comment:        The GeneXpert MRSA Assay (FDA approved for NASAL specimens only), is one component of a comprehensive MRSA colonization surveillance program. It is not intended to diagnose MRSA infection nor to guide or monitor treatment for MRSA infections.   Culture, blood (routine x 2)     Status: None   Collection Time: 06/01/15  8:51 PM  Result Value Ref Range Status   Specimen Description BLOOD BLOOD LEFT HAND  Final   Special Requests BOTTLES DRAWN AEROBIC ONLY 4CC  Final   Culture   Final    NO GROWTH 5 DAYS Performed at [facility]    Report Status 06/07/2015 FINAL  Final  Culture, blood (routine x 2)     Status: None   Collection Time: 06/04/15 11:10 AM  Result Value Ref Range Status   Specimen Description BLOOD RIGHT HAND  Final   Special Requests IN PEDIATRIC BOTTLE 4CC  Final   Culture    Final  NO GROWTH 5 DAYS Performed at [facility]    Report Status 06/09/2015 FINAL  Final  Culture, blood (routine x 2)     Status: Abnormal   Collection Time: 06/04/15 11:11 AM  Result Value Ref Range Status   Specimen Description BLOOD LEFT HAND  Final   Special Requests IN PEDIATRIC BOTTLE 3CC  Final   Culture  Setup Time   Final    GRAM POSITIVE COCCI IN PAIRS IN CHAINS AEROBIC BOTTLE ONLY CRITICAL RESULT CALLED TO, READ BACK BY AND VERIFIED WITHBaldo Daub RN 1900 06/05/15 A BROWNING    Culture (A)  Final    ENTEROCOCCUS SPECIES SUSCEPTIBILITIES PERFORMED ON PREVIOUS CULTURE WITHIN THE LAST 5 DAYS. Performed at [facility]    Report Status 06/07/2015 FINAL  Final  Culture, blood (routine x 2)     Status: None (Preliminary result)   Collection Time: 06/10/15  1:30 AM  Result Value Ref Range Status   Specimen Description BLOOD BLOOD RIGHT WRIST  Final   Special Requests IN PEDIATRIC BOTTLE 1CC  Final   Culture PENDING  Incomplete   Report Status PENDING  Incomplete  Culture, blood (routine x 2)     Status: None (Preliminary result)   Collection Time: 06/10/15  1:35 AM  Result Value Ref Range Status   Specimen Description BLOOD RIGHT HAND  Final   Special Requests BOTTLES DRAWN AEROBIC AND ANAEROBIC 5CC  Final   Culture PENDING  Incomplete   Report Status PENDING  Incomplete     Studies: No results found.  Scheduled Meds: . ampicillin (OMNIPEN) IV  2 g Intravenous Q6H  . antiseptic oral rinse  7 mL Mouth Rinse QID  . chlorhexidine gluconate (SAGE KIT)  15 mL Mouth Rinse BID  . furosemide  20 mg Intravenous Q12H  . insulin aspart  0-9 Units Subcutaneous Q8H  . levothyroxine  25 mcg Intravenous QAC breakfast   Continuous Infusions: . dextrose 35 mL/hr at 06/10/15 0835  . pantoprozole (PROTONIX) infusion 8 mg/hr (06/09/15 1413)  . Marland KitchenTPN (CLINIMIX-E) Adult 40 mL/hr at 06/09/15 1824    Active Problems:   GI bleed   Pressure ulcer   Gastrointestinal  hemorrhage associated with duodenal ulcer   Hypovolemic shock (HCC)   Bacteremia   NSTEMI (non-ST elevated myocardial infarction) ([place])   Malnutrition of moderate degree   Time spent: 40 minutes (> 50% dedicated to face to face examination, coordination of care and intercommunication with consultants)   [place], [facility] Pager [phone]. If 7PM-7AM, please contact night-coverage at [email or web address], password [facility] 06/10/2015, 10:18 AM  LOS: 9 days

## 2015-06-10 NOTE — Progress Notes (Signed)
[facility]:  [phone] General Surgery Progress Note   LOS: 9 days  POD -  4 Days Post-Op  Assessment/Plan: 1.  EXPLORATORY LAPAROTOMY OVERSEWING OF BLEEDING DUODENAL ULCER - 06/06/2015 - [place]  On ampicillin  Continue NPO, but will let have ice chips  She had a bloody BM last PM that was considered "fresh blood" - VS and Hgb stable - not sure of significance.  2.  NSTEMI (non-ST elevated myocardial infarction)  3.  Anemia -   Hgb - 9.0 - 06/10/2015 4.  Creat  1.24 - 06/10/2015 5.  Clean ulcer left heel 6.  Bedridden at home  Sits up, but cannot walkd 7.  Malnourished  Albumin - 1.7 from 06/06/2015  Started TPN on 06/09/2015 8.  DVT prophylaxis - on hold because of bleed   Active Problems:   GI bleed   Pressure ulcer   Gastrointestinal hemorrhage associated with duodenal ulcer   Hypovolemic shock (HCC)   Bacteremia   NSTEMI (non-ST elevated myocardial infarction) (HCC)   Malnutrition of moderate degree   Subjective:  She wants something to drink. I will let her have ice chips.  Objective:   Filed Vitals:   06/10/15 0600 06/10/15 0700  BP: 137/88 154/85  Pulse: 62 74  Temp: 96.8 F (36 C) 97.2 F (36.2 C)  Resp: 12 14     Intake/Output from previous day:  04/10 0701 - 04/11 0700 In: 2559 [I.V.:1655; IV Piggyback:400; TPN:504] Out: 1340 [Urine:1190; Drains:150]  Intake/Output this shift:      Physical Exam:   General: Obese older WF who is alert and oriented.    HEENT: Normal. Pupils equal.   .   Lungs: Bilateral rhonchi.   Abdomen: Soft.  Rare BS.   Wound: Clean.  Drain in RUQ - 150 cc recorded last 24 hours   Lab Results:     Recent Labs  06/09/15 2000 06/10/15 0135  WBC 9.4 9.1  HGB 8.8* 9.0*  HCT 26.0* 26.8*  PLT 128* 133*    BMET    Recent Labs  06/09/15 0430 06/10/15 0135  NA 149* 152*  K 3.4* 3.7  CL 119* 121*  CO2 24 23  GLUCOSE 111* 127*  BUN 10 11  CREATININE 1.37* 1.24*  CALCIUM 7.8* 8.0*     PT/INR    Recent Labs  06/09/15 2000 06/10/15 0135  LABPROT 15.9* 15.6*  INR 1.30 1.27    ABG  No results for input(s): PHART, HCO3 in the last 72 hours.  Invalid input(s): PCO2, PO2   Studies/Results:  No results found.   Anti-infectives:   Anti-infectives    Start     Dose/Rate Route Frequency Ordered Stop   06/06/15 1400  ampicillin (OMNIPEN) 2 g in sodium chloride 0.9 % 50 mL IVPB     2 g 150 mL/hr over 20 Minutes Intravenous Every 6 hours 06/06/15 0959     06/04/15 1000  vancomycin (VANCOCIN) 1,250 mg in sodium chloride 0.9 % 250 mL IVPB  Status:  Discontinued     1,250 mg 166.7 mL/hr over 90 Minutes Intravenous Every 24 hours 06/04/15 0901 06/04/15 0928   06/04/15 1000  ampicillin (OMNIPEN) 2 g in sodium chloride 0.9 % 50 mL IVPB  Status:  Discontinued     2 g 150 mL/hr over 20 Minutes Intravenous 6 times per day 06/04/15 0929 06/06/15 0959   06/02/15 0630  cefTRIAXone (ROCEPHIN) 2 g in dextrose 5 % 50 mL IVPB  Status:  Discontinued  2 g 100 mL/hr over 30 Minutes Intravenous Daily 06/02/15 0613 06/04/15 0928   06/02/15 0630  vancomycin (VANCOCIN) IVPB 1000 mg/200 mL premix  Status:  Discontinued     1,000 mg 200 mL/hr over 60 Minutes Intravenous Daily 06/02/15 0619 06/04/15 0901   06/02/15 0600  ampicillin-sulbactam (UNASYN) 1.5 g in sodium chloride 0.9 % 50 mL IVPB  Status:  Discontinued     1.5 g 100 mL/hr over 30 Minutes Intravenous Every 12 hours 06/01/15 1555 06/02/15 0613   06/01/15 1700  ampicillin-sulbactam (UNASYN) 1.5 g in sodium chloride 0.9 % 50 mL IVPB     1.5 g 100 mL/hr over 30 Minutes Intravenous  Once 06/01/15 1554 06/01/15 1726      Ovidio Kinavid Jaloni Sorber, MD, FACS Pager: [phone] [facility]: [phone] 06/10/2015

## 2015-06-10 NOTE — Progress Notes (Signed)
PARENTERAL NUTRITION CONSULT NOTE  Pharmacy Consult for TPN Indication: prolonged NPO, s/p duodenal ulcer oversewing  No Known Allergies  Patient Measurements: Height:  (165.1 cm) Weight: 239 lb 10.2 oz (108.7 kg) IBW/kg (Calculated) : 57 Adjusted Body Weight: 77.9 BMI 40  Vital Signs: Temp: 97.2 F (36.2 C) (04/11 0700) Temp Source: Core (Comment) (04/11 0400) BP: 154/85 mmHg (04/11 0700) Pulse Rate: 74 (04/11 0700) Intake/Output from previous day: 04/10 0701 - 04/11 0700 In: 2559 [I.V.:1655; IV Piggyback:400; TPN:504] Out: 1340 [Urine:1190; Drains:150] Intake/Output from this shift:    Labs:  Recent Labs  06/09/15 0430 06/09/15 2000 06/10/15 0135  WBC 9.2 9.4 9.1  HGB 8.5* 8.8* 9.0*  HCT 24.7* 26.0* 26.8*  PLT 130* 128* 133*  APTT 34 32 33  INR 1.24 1.30 1.27     Recent Labs  06/08/15 0544 06/09/15 0430 06/10/15 0135  NA 151* 149* 152*  K 3.3* 3.4* 3.7  CL 118* 119* 121*  CO2 GLUCOSE 89 111* 127*  BUN CREATININE 1.27* 1.37* 1.24*  CALCIUM 7.7* 7.8* 8.0*  MG 1.7 2.1 2.1  PHOS 3.1 3.3 3.2  PROT  --   --  5.4*  ALBUMIN 1.9* 2.1* 2.2*  AST  --   --  23  ALT  --   --  16  ALKPHOS  --   --  65  BILITOT  --   --  0.8   Estimated Creatinine Clearance: 43.6 mL/min (by C-G formula based on Cr of 1.24).    Recent Labs  06/09/15 2024 06/09/15 2353 06/10/15 0346  GLUCAP 119* 123* 110*    Medical History: Past Medical History  Diagnosis Date  . Hypertension     Medications:  Infusions:  . dextrose 5 % and 0.45% NaCl 35 mL/hr at 06/09/15 1800  . pantoprozole (PROTONIX) infusion 8 mg/hr (06/09/15 1413)  . Marland KitchenTPN (CLINIMIX-E) Adult 40 mL/hr at 06/09/15 1824    Insulin Requirements in the past 24 hours: None  Current Nutrition: NPO  IVF: D5 0.45% NaCl @ 35 ml/hr, change to D5  ml/hr  Central access: Rt IJ CVL (4/2) TPN start date: 4/10  ASSESSMENT                                                                                                           HPI: 69 yoF admitted on 4/2 with GIB and hypotension; s/p failed arterial embolization 4/6, requiring surgical oversewing of bleeding duodenal ulcer 06/06/15. She remains NPO with malnutrition, unable to meet needs, and is high risk for refeeding.  Surgery may allow PO intake 5 days postop.  Note significant weight increase from 86 kg on admission to 109 kg, I/O unreliable when stool volume is unmeasured.  Pharmacy has been consulted to dose TPN.  Significant events:  4/10 PM - 4/11 AM Overnight, nursing notes indicate 2 dark bloody BM 4/11 Concern for fluid overload (wt increase of 23 kg since admission, UOP yesterday averaged 0.45 ml/kg/hr).  Lasix added, CXR pending, IVF adjusted for  TPN volume.  Today:   Glucose: CBGs: 96-123  Electrolytes: Increasing hypernatremia and Cl.  K, Corr Ca 9.4, Ma, and Phos WNL.  Renal:  SCr elevated but improved  LFTs: WNL (4/11)  TGs:  134  Prealbumin: 11.5  NUTRITIONAL GOALS                                                                                             RD recs (4/10): 1850-2035 kcal/day, 110-120 g protein/day  Based on published guidelines, while the patient meets ICU status the initiation of lipids is being delayed for 7 days or until transition out of ICU. Planned start date for lipids is 4/17.  Goal will be to meet 100% of the patient's protein needs and approximately 80% of their caloric needs.  Clinimix 5/20 @ 90 mL/hr to provide 108 grams protein (98% of minimum goal) and 1900 kcal (100% of minimum goal)    PLAN                                                                                                                         Now: Change IVF from D5 0.45% NaCl to only D5 at 35 ml/hr   At 1800 today:  Increase to Clinimix E 5/20 at 60 ml/hr (slow advancement d/t fluid status and hypernatremia)  HOLD lipids (may start on 4/17 or when no longer ICU status)   Plan to advance as  tolerated to the goal rate.  TPN to contain standard multivitamins and trace elements.  Decrease IVF to 15 ml/hr  Continue sensitive SSI and CBGs q8h  TPN lab panels on Mondays & Thursdays.  F/u daily.  Lynann Beaverhristine Redell Nazir PharmD, BCPS Pager [phone] 06/10/2015 7:07 AM

## 2015-06-11 ENCOUNTER — Inpatient Hospital Stay (HOSPITAL_COMMUNITY): Payer: Medicare Other

## 2015-06-11 DIAGNOSIS — R6520 Severe sepsis without septic shock: Secondary | ICD-10-CM

## 2015-06-11 LAB — RENAL FUNCTION PANEL
ANION GAP: 5 (ref 5–15)
Albumin: 1.9 g/dL — ABNORMAL LOW (ref 3.5–5.0)
BUN: 14 mg/dL (ref 6–20)
CHLORIDE: 116 mmol/L — AB (ref 101–111)
CO2: 30 mmol/L (ref 22–32)
Calcium: 8.1 mg/dL — ABNORMAL LOW (ref 8.9–10.3)
Creatinine, Ser: 1.22 mg/dL — ABNORMAL HIGH (ref 0.44–1.00)
GFR calc non Af Amer: 40 mL/min — ABNORMAL LOW (ref >60)
GFR, EST AFRICAN AMERICAN: 47 mL/min — AB (ref >60)
GLUCOSE: 135 mg/dL — AB (ref 65–99)
POTASSIUM: 3.1 mmol/L — AB (ref 3.5–5.1)
Phosphorus: 2.9 mg/dL (ref 2.5–4.6)
SODIUM: 151 mmol/L — AB (ref 135–145)

## 2015-06-11 LAB — GLUCOSE, CAPILLARY
GLUCOSE-CAPILLARY: 122 mg/dL — AB (ref 65–99)
GLUCOSE-CAPILLARY: 130 mg/dL — AB (ref 65–99)
Glucose-Capillary: 128 mg/dL — ABNORMAL HIGH (ref 65–99)

## 2015-06-11 LAB — CBC WITH DIFFERENTIAL/PLATELET
BASOS PCT: 0 %
Basophils Absolute: 0 10*3/uL (ref 0.0–0.1)
EOS ABS: 0.3 10*3/uL (ref 0.0–0.7)
EOS PCT: 4 %
HCT: 24.9 % — ABNORMAL LOW (ref 36.0–46.0)
Hemoglobin: 8.3 g/dL — ABNORMAL LOW (ref 12.0–15.0)
Lymphocytes Relative: 14 %
Lymphs Abs: 0.9 10*3/uL (ref 0.7–4.0)
MCH: 30.4 pg (ref 26.0–34.0)
MCHC: 33.3 g/dL (ref 30.0–36.0)
MCV: 91.2 fL (ref 78.0–100.0)
MONOS PCT: 10 %
Monocytes Absolute: 0.7 10*3/uL (ref 0.1–1.0)
Neutro Abs: 5 10*3/uL (ref 1.7–7.7)
Neutrophils Relative %: 72 %
PLATELETS: 125 10*3/uL — AB (ref 150–400)
RBC: 2.73 MIL/uL — ABNORMAL LOW (ref 3.87–5.11)
RDW: 19.6 % — AB (ref 11.5–15.5)
WBC: 6.8 10*3/uL (ref 4.0–10.5)

## 2015-06-11 LAB — APTT: APTT: 35 s (ref 24–37)

## 2015-06-11 LAB — MAGNESIUM: Magnesium: 2.2 mg/dL (ref 1.7–2.4)

## 2015-06-11 LAB — FIBRINOGEN: Fibrinogen: 397 mg/dL (ref 204–475)

## 2015-06-11 LAB — PROTIME-INR
INR: 1.08 (ref 0.00–1.49)
PROTHROMBIN TIME: 14.2 s (ref 11.6–15.2)

## 2015-06-11 MED ORDER — POTASSIUM CHLORIDE 10 MEQ/100ML IV SOLN
10.0000 meq | INTRAVENOUS | Status: DC
  Administered 2015-06-11: 10 meq via INTRAVENOUS
  Filled 2015-06-11: qty 100

## 2015-06-11 MED ORDER — POTASSIUM CHLORIDE 10 MEQ/50ML IV SOLN
10.0000 meq | INTRAVENOUS | Status: AC
  Administered 2015-06-11 (×5): 10 meq via INTRAVENOUS
  Filled 2015-06-11 (×5): qty 50

## 2015-06-11 MED ORDER — PANTOPRAZOLE SODIUM 40 MG IV SOLR
40.0000 mg | Freq: Two times a day (BID) | INTRAVENOUS | Status: DC
  Administered 2015-06-11 – 2015-06-20 (×18): 40 mg via INTRAVENOUS
  Filled 2015-06-11 (×19): qty 40

## 2015-06-11 MED ORDER — M.V.I. ADULT IV INJ
INJECTION | INTRAVENOUS | Status: AC
  Administered 2015-06-11: 18:00:00 via INTRAVENOUS
  Filled 2015-06-11: qty 1992

## 2015-06-11 MED ORDER — FUROSEMIDE 10 MG/ML IJ SOLN
40.0000 mg | Freq: Two times a day (BID) | INTRAMUSCULAR | Status: DC
  Administered 2015-06-11 – 2015-06-12 (×3): 40 mg via INTRAVENOUS
  Filled 2015-06-11 (×3): qty 4

## 2015-06-11 MED ORDER — DEXTROSE 5 % IV SOLN
INTRAVENOUS | Status: DC
  Administered 2015-06-11: 18:00:00 via INTRAVENOUS
  Administered 2015-06-17: 10 mL via INTRAVENOUS

## 2015-06-11 NOTE — Progress Notes (Signed)
    [facility]   Reason for visit: Follow up on Enterococcal bacteremia  Interval History:  repeat blood culture now negative 1 day.  Unable and unwilling to do TEE at this time.   Physical Exam: Constitutional: up in chair Filed Vitals:   06/11/15 0900 06/11/15 1000  BP: 151/81 118/85  Pulse: 80 65  Temp: 97 F (36.1 C) 97 F (36.1 C)  Resp: 22 13  CARDIOVASCULAR: Tachy RR Skin: no rashes Resp: + rhconchi  Impression: Enterococcus, likely still GI source with ongoing bleed. Improving.    Plan: 1.  Continue ampicillin.   Repeat blood cultures TEE If/when able May otherwise just get prolonged course of ampicillin in IV for 28 days total from yesterday.   2. Bleed - may have been iniial source of #1.

## 2015-06-11 NOTE — Progress Notes (Signed)
Jasmine Newman 1:09 PM  Subjective: Patient without signs of further bleeding doing much better today than yesterday and no new complaints  Objective: Vital signs stable afebrile no acute distress abdomen is a little sore but soft hemoglobin minimal drop BUN okay  Assessment: Duodenal ulcer status post surgery  Plan: Please let me know if I could help any further with this hospital stay otherwise care per internal medicine and surgery  [facility] CampusMAGOD,Jeric Slagel E  Pager [phone] After 5PM or if no answer call [phone]

## 2015-06-11 NOTE — Progress Notes (Signed)
Date:  June 11, 2015 Chart reviewed for concurrent status and case management needs. Will continue to follow patient for changes and needs: remains hypotensive, hgb 8.3, no active bleeding, iv flds, starting po sips only Marcelle Smilinghonda Firas Guardado, BSN, Charity fundraiserN, [place]   [phone]

## 2015-06-11 NOTE — Progress Notes (Signed)
[facility] PROGRESS NOTE  Jasmine Newman HBZ:[id number] DOB: 10-Dec-1931 DOA: 06/01/2015 PCP: No primary care provider on file.  Interim summary/brief H&P 82 year old with past mental history of hypothyroidism, hypertension, hyperlipidemia, depression. Admitted today to the ED after she had a fall at home. She is watching the [employer] basketball game with her husband. When she stood up to use the bathroom her legs gave out and she fell on the right side. She hurt her leg and chest. She did not hit her head and there was no loss of consciousness. She stayed on the floor the whole night. When her son arrived next morning she was found to be lying in a large pool of melanotic stool. In the ED she was found to be hypertensive with hemoglobin of 7.4 (baseline unknown). She continued to be hypotensive despite of 2 L of fluid. GI evaluated the patient. PCCM called for admission. Patient ended experiencing need for intubation and pressors; initially failed  IR approach for embolization and subsequent required surgery. Hemodynamically stable and with good O2 sat currently. Course complicated with enterococcus bacteremia and either NSTEMI vs demand ischemia. Needs TEE. ID, CCS and cardiology on board.  Assessment/Plan: 1 Respiratory failure with hypoxia: required intubation until 06/08/15 -Suspect secondary to multiple factors including pulmonary edema -Continues to have extensive bilateral crackles, a. Volume overload on physical examination. -Chest x-ray on 06/07/2015 had revealed interstitial and airspace opacities that could be consistent with edema. -Repeat chest x-ray on 06/11/2015 pending -Given ongoing evidence of volume overload including having extensive crackles on physical examination will increase Lasix to 40 mg IV twice a day, follow a.m. BMP  2 Shock: sepsis vs secondary to upper GI bleed -resolved -BP stable -She received aggressive IV fluid resuscitation initially -On examination  appears volume overload for which will increase Lasix to 40 mg IV twice a day  3 Aortic stenosis: s/p bioprosthetic valve -Transesophageal echocardiogram planned for 06/12/2015, having blood cultures growing enterococcus from 06/01/2015 and 06/04/2015 -keep good BP control  4 Suspect demand ischemia -She presented with septic shock, GI bleed requiring pressor support initially -2-D echo with wall motion abnormalities -cardiology consulted  -not on ASA due to GIB; not on b-blockers or ACE, given intermittent episodes of shock and renal failure  5.  Upper GI bleed secondary to large duodenal ulcer -She presented with melanotic stools, EGD performed on 06/02/2015 revealed a large 3 cm posterior wall duodenal ulcer that had a large adherent clot. Initially injected with epinephrine. She continued to have upper GI bleed, interventional radiology consulted undergoing coil embolization on 06/05/2015. -She re-bled for which general surgery was consulted and evaluated her on 06/06/2015. She was taken to the operating room on 06/06/2015 where she underwent exploratory laparotomy with oversewing of duodenal ulcer. -She has required blood transfusions during this hospitalization -Lab work on 06/11/2015 show a hemoglobin of 8.3 with hematocrit of 24.9 -continue PPI  6 Acute kidney injury: due to shock episodes and decrease perfusion -Lab work on 06/11/2015 showing creatinine of 1.22 with BUN of 14.  7 Acute blood loss anemia -Secondary to upper GI bleed in setting of large duodenal ulcer -transfusion as needed; goal is for Hgb > 8 -follow Hgb and platelets trend -not active bleeding currently  8-Sepsis: secondary to enterococcus bacteremia -Present on admission  -Blood cultures obtained on 06/01/2015 and 06/04/2015 growing enterococcus species -Transesophageal echocardiogram planned for 06/12/2015 -continue ampicillin; ID on board  9 Left heel ulcer: present prior to admission -follow WOC  recommendations -continue prevlon  boots and preventive measurements   10 Modertae protein calorie malnutrition  -continue TPN for now  Code Status: Full Family Communication: no family at bedside  Disposition Plan: remains in stepdown; continue TPN; diet advance as per CCS rec's; cardiology to assist with TEE.   Consultants:  PCCM  CCS  ID  Cardiology (for TEE)  Procedures/studies: X ray rib 4/2: No fractures Port CXR 4/2: Questionable right basal hazy opacity. R IJ CVL in mid-SVC. Chronic elevation left hemidiaphragm.  R Knee 2View 4/2: No fracture or dislocation. Port CXR 4/4: Silhouetting of left hemidiaphragm unchanged. Endotracheal tube and central venous catheter in good position. No new focal opacity. TTE 4/4: LV normal in size with EF 45-50%. Akinesis of anterior septal myocardium. Grade 2 diastolic dysfunction. LA & RA normal in size. RV normal in size and function. PASP 83mHg. Moderate aortic stenosis w/ bioprosthetic aortic valve without regurgitation. Severe mitral vegetation without stenosis. Trivial pulmonic regurgitation without stenosis. Moderate tricuspid regurgitation. Port CXR 4/8: Endotracheal tube and central line in good position. Low lung volumes. Hazy opacification bilaterally.  Antibiotics:  Ampicillin   HPI/Subjective: She appears somewhat confused and disoriented, asking for ice chips  Objective: Filed Vitals:   06/11/15 0500 06/11/15 0600  BP: 145/76 124/57  Pulse: 74 64  Temp: 97.5 F (36.4 C) 97.2 F (36.2 C)  Resp: 21 12    Intake/Output Summary (Last 24 hours) at 06/11/15 0650 Last data filed at 06/11/15 0600  Gross per 24 hour  Intake 2474.33 ml  Output   5425 ml  Net -2950.67 ml   Filed Weights   06/09/15 0431 06/10/15 0154 06/11/15 0500  Weight: 109.3 kg (240 lb 15.4 oz) 108.7 kg (239 lb 10.2 oz) 107.8 kg (237 lb 10.5 oz)    Exam:   General:  Afebrile, complaining of soreness in her abd, no nausea, no vomiting.  Reports some SOB, but with good O2 sat on 2L.  Cardiovascular: rate controlled currently, no rubs or gallops, positive JVD, positive SM  Respiratory: positive crackles at bases; diffuse rhonchi and mild exp wheezing  Abdomen: soft, positive BS, drain in place, mild tenderness with palpation; no guarding   Musculoskeletal: 2+ edema and left heel ulcer appreciated; no cyanosis; SCD's in place  Data Reviewed: Basic Metabolic Panel:  Recent Labs Lab 06/07/15 0200 06/08/15 0544 06/09/15 0430 06/10/15 0135 06/11/15 0440  NA 149*  148* 151* 149* 152* 151*  K 3.6  3.5 3.3* 3.4* 3.7 3.1*  CL 118*  118* 118* 119* 121* 116*  CO2 _0 GLUCOSE 108*  107* 89 111* 127* 135*  BUN _1 CREATININE 1.27*  1.32* 1.27* 1.37* 1.24* 1.22*  CALCIUM 7.8*  7.7* 7.7* 7.8* 8.0* 8.1*  MG 1.7 1.7 2.1 2.1 2.2  PHOS 3.2 3.1 3.3 3.2 2.9   Liver Function Tests:  Recent Labs Lab 06/06/15 1623 06/07/15 0200 06/08/15 0544 06/09/15 0430 06/10/15 0135 06/11/15 0440  AST 18  --   --   --  23  --   ALT 16  --   --   --  16  --   ALKPHOS 35*  --   --   --  65  --   BILITOT 0.5  --   --   --  0.8  --   PROT 3.4*  --   --   --  5.4*  --   ALBUMIN 1.7* 2.1* 1.9* 2.1* 2.2* 1.9*  CBC:  Recent Labs Lab 06/08/15 0544 06/08/15 1400 06/09/15 0430 06/09/15 2000 06/10/15 0135 06/11/15 0440  WBC 9.4  --  9.2 9.4 9.1 6.8  NEUTROABS 7.0  --  6.6 7.6 7.2 5.0  HGB 7.0* 9.3* 8.5* 8.8* 9.0* 8.3*  HCT 20.0* 27.5* 24.7* 26.0* 26.8* 24.9*  MCV 87.0  --  87.3 88.7 89.3 91.2  PLT 119*  --  130* 128* 133* 125*   Cardiac Enzymes: No results for input(s): CKTOTAL, CKMB, CKMBINDEX, TROPONINI in the last 168 hours. BNP (last 3 results)  Recent Labs  06/01/15 2100  BNP 329.7*   CBG:  Recent Labs Lab 06/10/15 0346 06/10/15 0806 06/10/15 1622 06/10/15 2025 06/10/15 2330  GLUCAP 110* 113* 110* 120* 122*    Recent Results (from the past 240 hour(s))  Urine culture      Status: None (Preliminary result)   Collection Time: 06/01/15 11:37 AM  Result Value Ref Range Status   Specimen Description URINE, CATHETERIZED  Final   Special Requests NONE  Final   Culture   Final    >=100,000 COLONIES/mL ENTEROCOCCUS SPECIES SENT TO REFERENCE LAB FOR SENSITIVITIES Performed at [facility]    Report Status PENDING  Incomplete  Culture, blood (single)     Status: None   Collection Time: 06/01/15  3:13 PM  Result Value Ref Range Status   Specimen Description BLOOD RIGHT ARM  Final   Special Requests BOTTLES DRAWN AEROBIC AND ANAEROBIC Miranda  Final   Culture  Setup Time   Final    IN BOTH AEROBIC AND ANAEROBIC BOTTLES GRAM POSITIVE COCCI IN PAIRS IN CHAINS CRITICAL RESULT CALLED TO, READ BACK BY AND VERIFIED WITH: M.SCHRAMM,RN 1610 06/02/15 M.CAMPBELL    Culture   Final    ENTEROCOCCUS SPECIES Performed at [facility]    Report Status 06/04/2015 FINAL  Final   Organism ID, Bacteria ENTEROCOCCUS SPECIES  Final      Susceptibility   Enterococcus species - MIC*    AMPICILLIN <=2 SENSITIVE Sensitive     VANCOMYCIN 1 SENSITIVE Sensitive     GENTAMICIN SYNERGY SENSITIVE Sensitive     * ENTEROCOCCUS SPECIES  Urine culture     Status: None   Collection Time: 06/01/15  4:30 PM  Result Value Ref Range Status   Specimen Description URINE, CATHETERIZED  Final   Special Requests NONE  Final   Culture   Final    >=100,000 COLONIES/mL ENTEROCOCCUS SPECIES Performed at [facility]    Report Status 06/04/2015 FINAL  Final   Organism ID, Bacteria ENTEROCOCCUS SPECIES  Final      Susceptibility   Enterococcus species - MIC*    AMPICILLIN <=2 SENSITIVE Sensitive     LEVOFLOXACIN 1 SENSITIVE Sensitive     NITROFURANTOIN 32 SENSITIVE Sensitive     VANCOMYCIN 1 SENSITIVE Sensitive     * >=100,000 COLONIES/mL ENTEROCOCCUS SPECIES  MRSA PCR Screening     Status: None   Collection Time: 06/01/15  4:37 PM  Result Value Ref Range Status   MRSA by  PCR NEGATIVE NEGATIVE Final    Comment:        The GeneXpert MRSA Assay (FDA approved for NASAL specimens only), is one component of a comprehensive MRSA colonization surveillance program. It is not intended to diagnose MRSA infection nor to guide or monitor treatment for MRSA infections.   Culture, blood (routine x 2)     Status: None   Collection Time: 06/01/15  8:51 PM  Result  Value Ref Range Status   Specimen Description BLOOD BLOOD LEFT HAND  Final   Special Requests BOTTLES DRAWN AEROBIC ONLY 4CC  Final   Culture   Final    NO GROWTH 5 DAYS Performed at [facility]    Report Status 06/07/2015 FINAL  Final  Culture, blood (routine x 2)     Status: None   Collection Time: 06/04/15 11:10 AM  Result Value Ref Range Status   Specimen Description BLOOD RIGHT HAND  Final   Special Requests IN PEDIATRIC BOTTLE 4CC  Final   Culture   Final    NO GROWTH 5 DAYS Performed at [facility]    Report Status 06/09/2015 FINAL  Final  Culture, blood (routine x 2)     Status: Abnormal   Collection Time: 06/04/15 11:11 AM  Result Value Ref Range Status   Specimen Description BLOOD LEFT HAND  Final   Special Requests IN PEDIATRIC BOTTLE 3CC  Final   Culture  Setup Time   Final    GRAM POSITIVE COCCI IN PAIRS IN CHAINS AEROBIC BOTTLE ONLY CRITICAL RESULT CALLED TO, READ BACK BY AND VERIFIED WITHBaldo Daub RN 1900 06/05/15 A BROWNING    Culture (A)  Final    ENTEROCOCCUS SPECIES SUSCEPTIBILITIES PERFORMED ON PREVIOUS CULTURE WITHIN THE LAST 5 DAYS. Performed at [facility]    Report Status 06/07/2015 FINAL  Final  Culture, blood (routine x 2)     Status: None (Preliminary result)   Collection Time: 06/10/15  1:30 AM  Result Value Ref Range Status   Specimen Description BLOOD BLOOD RIGHT WRIST  Final   Special Requests IN PEDIATRIC BOTTLE 1CC  Final   Culture PENDING  Incomplete   Report Status PENDING  Incomplete  Culture, blood (routine x 2)     Status:  None (Preliminary result)   Collection Time: 06/10/15  1:35 AM  Result Value Ref Range Status   Specimen Description BLOOD RIGHT HAND  Final   Special Requests BOTTLES DRAWN AEROBIC AND ANAEROBIC 5CC  Final   Culture PENDING  Incomplete   Report Status PENDING  Incomplete     Studies: No results found.  Scheduled Meds: . ampicillin (OMNIPEN) IV  2 g Intravenous Q6H  . antiseptic oral rinse  7 mL Mouth Rinse QID  . chlorhexidine gluconate (SAGE KIT)  15 mL Mouth Rinse BID  . furosemide  40 mg Intravenous BID  . insulin aspart  0-9 Units Subcutaneous Q8H  . levothyroxine  25 mcg Intravenous QAC breakfast  . potassium chloride  10 mEq Intravenous Q1 Hr x 2   Continuous Infusions: . Marland KitchenTPN (CLINIMIX-E) Adult 60 mL/hr at 06/10/15 1724  . dextrose 15 mL/hr at 06/10/15 1849  . pantoprozole (PROTONIX) infusion 8 mg/hr (06/10/15 2100)    Active Problems:   GI bleed   Pressure ulcer   Gastrointestinal hemorrhage associated with duodenal ulcer   Hypovolemic shock (HCC)   Bacteremia   NSTEMI (non-ST elevated myocardial infarction) ([place])   Malnutrition of moderate degree   Time spent: 35 minutes (> 50% dedicated to face to face examination, coordination of care and intercommunication with consultants)   Kelvin Cellar  [facility] Pager [phone]. If 7PM-7AM, please contact night-coverage at [email or web address], password [facility] 06/11/2015, 6:50 AM  LOS: 10 days

## 2015-06-11 NOTE — Progress Notes (Signed)
[facility]:  [phone] General Surgery Progress Note   LOS: 10 days  POD -  5 Days Post-Op  Assessment/Plan: 1.  EXPLORATORY LAPAROTOMY OVERSEWING OF BLEEDING DUODENAL ULCER - 06/06/2015 - [place]  On ampicillin  To start sips from floor - will advance diet if tolerated.  She looks pretty good today.  2.  NSTEMI (non-ST elevated myocardial infarction)   History of bioprosthetic valve 3.  Anemia -   Hgb - 8.3 - 06/11/2015 4.  Creat  1.22 - 06/11/2015 5.  Clean ulcer left heel 6.  Bedridden at home  Sits up, but cannot walkd 7.  Malnourished  Albumin - 1.7 from 06/06/2015  Started TPN on 06/09/2015 8.  DVT prophylaxis - on hold because of bleed   Active Problems:   GI bleed   Pressure ulcer   Gastrointestinal hemorrhage associated with duodenal ulcer   Hypovolemic shock (HCC)   Bacteremia   NSTEMI (non-ST elevated myocardial infarction) (HCC)   Malnutrition of moderate degree   Subjective:  She wants something to drink. Otherwise doing okay.  Objective:   Filed Vitals:   06/11/15 0500 06/11/15 0600  BP: 145/76 124/57  Pulse: 74 64  Temp: 97.5 F (36.4 C) 97.2 F (36.2 C)  Resp: 21 12     Intake/Output from previous day:  04/11 0701 - 04/12 0700 In: 2174.3 [I.V.:852.3; IV Piggyback:150; TPN:1172] Out: 5425 [Urine:2675; Drains:2750]  Intake/Output this shift:  Total I/O In: -  Out: 150 [Urine:150]   Physical Exam:   General: Obese older WF who is alert and oriented.    HEENT: Normal. Pupils equal.   .   Lungs: clear   Abdomen: Soft.  Has BS.   Wound: Clean.  Drain in RUQ - probably misentered data - discussed with nurse.   Lab Results:     Recent Labs  06/10/15 0135 06/11/15 0440  WBC 9.1 6.8  HGB 9.0* 8.3*  HCT 26.8* 24.9*  PLT 133* 125*    BMET    Recent Labs  06/10/15 0135 06/11/15 0440  NA 152* 151*  K 3.7 3.1*  CL 121* 116*  CO2 23 30  GLUCOSE 127* 135*  BUN 11 14  CREATININE 1.24* 1.22*  CALCIUM 8.0*  8.1*    PT/INR    Recent Labs  06/10/15 0135 06/11/15 0440  LABPROT 15.6* 14.2  INR 1.27 1.08    ABG  No results for input(s): PHART, HCO3 in the last 72 hours.  Invalid input(s): PCO2, PO2   Studies/Results:  Dg Chest Port 1 View  06/11/2015  CLINICAL DATA:  Shortness of breath. EXAM: PORTABLE CHEST 1 VIEW COMPARISON:  06/07/2015. FINDINGS: Interim extubation removal of NG tube. Right IJ line in stable position. Prior CABG. Cardiomegaly with persistent bilateral from interstitial prominence and bilateral pleural effusions, left side greater right. Findings consistent congestive heart failure. Slight worsening from prior exam. No pneumothorax. IMPRESSION: 1. Interim extubation removal of NG tube. Right IJ line in stable position. 2. Cardiomegaly with diffuse bilateral from interstitial prominence left side greater right. Bilateral pleural effusions left side greater right. Findings consistent congestive heart failure. Findings have progressed from prior exam . Electronically Signed   By: Maisie Fushomas  Register   On: 06/11/2015 07:12     Anti-infectives:   Anti-infectives    Start     Dose/Rate Route Frequency Ordered Stop   06/06/15 1400  ampicillin (OMNIPEN) 2 g in sodium chloride 0.9 % 50 mL IVPB     2 g 150  mL/hr over 20 Minutes Intravenous Every 6 hours 06/06/15 0959     06/04/15 1000  vancomycin (VANCOCIN) 1,250 mg in sodium chloride 0.9 % 250 mL IVPB  Status:  Discontinued     1,250 mg 166.7 mL/hr over 90 Minutes Intravenous Every 24 hours 06/04/15 0901 06/04/15 0928   06/04/15 1000  ampicillin (OMNIPEN) 2 g in sodium chloride 0.9 % 50 mL IVPB  Status:  Discontinued     2 g 150 mL/hr over 20 Minutes Intravenous 6 times per day 06/04/15 0929 06/06/15 0959   06/02/15 0630  cefTRIAXone (ROCEPHIN) 2 g in dextrose 5 % 50 mL IVPB  Status:  Discontinued     2 g 100 mL/hr over 30 Minutes Intravenous Daily 06/02/15 0613 06/04/15 0928   06/02/15 0630  vancomycin (VANCOCIN) IVPB 1000  mg/200 mL premix  Status:  Discontinued     1,000 mg 200 mL/hr over 60 Minutes Intravenous Daily 06/02/15 0619 06/04/15 0901   06/02/15 0600  ampicillin-sulbactam (UNASYN) 1.5 g in sodium chloride 0.9 % 50 mL IVPB  Status:  Discontinued     1.5 g 100 mL/hr over 30 Minutes Intravenous Every 12 hours 06/01/15 1555 06/02/15 0613   06/01/15 1700  ampicillin-sulbactam (UNASYN) 1.5 g in sodium chloride 0.9 % 50 mL IVPB     1.5 g 100 mL/hr over 30 Minutes Intravenous  Once 06/01/15 1554 06/01/15 1726      Ovidio Kin, MD, FACS Pager: [phone] [facility]: [phone] 06/11/2015

## 2015-06-11 NOTE — Progress Notes (Signed)
PARENTERAL NUTRITION CONSULT NOTE  Pharmacy Consult for TPN Indication: prolonged NPO, s/p duodenal ulcer oversewing  No Known Allergies  Patient Measurements: Height: 5\' 5"  (165.1 cm) Weight: 237 lb 10.5 oz (107.8 kg) IBW/kg (Calculated) : 57 Adjusted Body Weight: 77.9 BMI 40  Vital Signs: Temp: 97.2 F (36.2 C) (04/12 0600) Temp Source: Core (Comment) (04/12 0400) BP: 124/57 mmHg (04/12 0600) Pulse Rate: 64 (04/12 0600) Intake/Output from previous day: 04/11 0701 - 04/12 0700 In: 2174.3 [I.V.:852.3; IV Piggyback:150; TPN:1172] Out: 5425 [Urine:2675; Drains:2750] Intake/Output from this shift:    Labs:  Recent Labs  06/09/15 2000 06/10/15 0135 06/11/15 0440  WBC 9.4 9.1 6.8  HGB 8.8* 9.0* 8.3*  HCT 26.0* 26.8* 24.9*  PLT 128* 133* 125*  APTT 32 33 35  INR 1.30 1.27 1.08     Recent Labs  06/09/15 0430 06/10/15 0135 06/11/15 0440  NA 149* 152* 151*  K 3.4* 3.7 3.1*  CL 119* 121* 116*  CO2 24 23 30   GLUCOSE 111* 127* 135*  BUN 10 11 14   CREATININE 1.37* 1.24* 1.22*  CALCIUM 7.8* 8.0* 8.1*  MG 2.1 2.1 2.2  PHOS 3.3 3.2 2.9  PROT  --  5.4*  --   ALBUMIN 2.1* 2.2* 1.9*  AST  --  23  --   ALT  --  16  --   ALKPHOS  --  65  --   BILITOT  --  0.8  --   PREALBUMIN  --  11.5*  --   TRIG  --  134  --    Estimated Creatinine Clearance: 44.1 mL/min (by C-G formula based on Cr of 1.22).    Recent Labs  06/10/15 1622 06/10/15 2025 06/10/15 2330  GLUCAP 110* 120* 122*    Medical History: Past Medical History  Diagnosis Date  . Hypertension     Medications:  Infusions:  . Marland Kitchen.TPN (CLINIMIX-E) Adult 60 mL/hr at 06/10/15 1724  . dextrose 15 mL/hr at 06/10/15 1849  . pantoprozole (PROTONIX) infusion 8 mg/hr (06/10/15 2100)    Insulin Requirements in the past 24 hours: 1 unit SSI  Current Nutrition: NPO  IVF: D5 @15  ml/hr  Central access: Rt IJ CVL (4/2) TPN start date: 4/10  ASSESSMENT                                                                                                           HPI: [id number] yoF admitted on 4/2 with GIB and hypotension; s/p failed arterial embolization 4/6, requiring surgical oversewing of bleeding duodenal ulcer 06/06/15. She remains NPO with malnutrition, unable to meet needs, and is high risk for refeeding.  Surgery may allow PO intake 5 days postop.  Note significant weight increase from 86 kg on admission to 109 kg, I/O unreliable when stool volume is unmeasured.  Pharmacy has been consulted to dose TPN.  Significant events:  4/10 PM - 4/11 AM Overnight, nursing notes indicate 2 dark bloody BM 4/11 Concern for fluid overload (wt increase of 23 kg since admission, UOP yesterday averaged 0.45 ml/kg/hr).  Lasix added, CXR pending, IVF adjusted for TPN volume. 4/12 Remains fluid overload but had excellent UOP yesterday (1.25 ml/kg/hr, I/O - 3.25L) and lasix dose increased today.  Today:   Glucose: CBGs: 113-122  Electrolytes: Na and Cl elevated, but improved.  K low (lasix added 4/11, dose inc 4/12).  Corr Ca 9.8, Mag, and Phos WNL.  Renal:  SCr elevated but improved  LFTs: WNL (4/11)  TGs:  134  Prealbumin: 11.5  NUTRITIONAL GOALS                                                                                             RD recs (4/10): 1850-2035 kcal/day, 110-120 g protein/day  Based on published guidelines, while the patient meets ICU status the initiation of lipids is being delayed for 7 days or until transition out of ICU. Planned start date for lipids is 4/17.  Goal will be to meet 100% of the patient's protein needs and approximately 80% of their caloric needs.  Clinimix 5/20 @ 90 mL/hr to provide 108 grams protein (98% of minimum goal) and 1900 kcal (100% of minimum goal)    PLAN                                                                                                                         Now: KCl 10 mEq IV x 5 runs (in addition to previous 1 dose already given for a total of 6  runs)  At 1800 today:  Increase to Clinimix E 5/20 at 83 ml/hr (slow advancement d/t fluid status and hypernatremia)  HOLD lipids (may start on 4/17 or when no longer ICU status)   Plan to advance as tolerated to the goal rate.  TPN to contain standard multivitamins and trace elements.  Decrease IVF to [facility]  Continue sensitive SSI and CBGs q8h  TPN lab panels on Mondays & Thursdays.  F/u daily.  Lynann Beaver PharmD, BCPS Pager [phone] 06/11/2015 8:00 AM

## 2015-06-11 NOTE — Progress Notes (Signed)
Patient has tolerated water and ginger ale today and will progress to clear liquid diet for dinner per Dr Allene PyoNewman's note.

## 2015-06-11 NOTE — Progress Notes (Signed)
Nutrition Follow-up  DOCUMENTATION CODES:   Non-severe (moderate) malnutrition in context of acute illness/injury, Obesity unspecified  INTERVENTION:   Monitor magnesium, potassium, and phosphorus daily for at least 3 days, MD to replete as needed, as pt is at risk for refeeding syndrome.  TPN per Pharmacy RD to continue to monitor  NUTRITION DIAGNOSIS:   Inadequate oral intake related to inability to eat as evidenced by NPO status.  Ongoing.  GOAL:   Patient will meet greater than or equal to 90% of their needs  Progressing.  MONITOR:   Weight trends, Labs, Skin, I & O's, Other (Comment) (TPN regimen)  ASSESSMENT:   82 year old with past mental history of hypothyroidism, hypertension, hyperlipidemia, depression. Admitted today to the ED after she had a fall at home. [facility] [employer] [facility] with her husband. When she stood up to use the bathroom her legs gave out and she fell on the right side. She hurt her leg and chest. She did not hit her head and there was no loss of consciousness. She stayed on the floor the whole night. When her son arrived next morning she was found to be lying in a large pool of melanotic stool. In the ED she was found to be hypertensive with hemoglobin of 7.4 (baseline unknown).   Pt continues to be NPO, now can have sips of clears. Pt reports feeling hungry and wants fluids. Pt's weight has increased by 47 lb since admission d/t fluid accumulation, Lasix has been ordered. Will keep estimated needs the same.  Plan per Pharmacy 4/12: At 1800 today:  Increase to Clinimix E 5/20 at 83 ml/hr (slow advancement d/t fluid status and hypernatremia)  HOLD lipids (may start on 4/17 or when no longer ICU status)  Plan to advance as tolerated to the goal rate.  Goal: Clinimix 5/20 @ 90 mL/hr to provide 108 grams protein (98% of minimum goal) and 1900 kcal (100% of minimum goal)   Medications: IV Lasix BID, IV KCl 5 x daily, D5 @ 10 ml/hr  -provides 41 kcal, Zofran PRN  Labs reviewed: CBGs: 120-128 Elevated Na Low K Mg/Phos WNL  Diet Order:  .TPN (CLINIMIX-E) Adult Diet NPO time specified Except for: Citigroupce Chips, Sips with Meds, Other (See Comments) .TPN (CLINIMIX-E) Adult  Skin:  Wound (see comment) (Stage 3 L foot pressure injury, Abdominal incision from 06/06/15)  Last BM:  4/11  Height:   Ht Readings from Last 1 Encounters:  06/01/15 5\' 5"  (1.651 m)    Weight:   Wt Readings from Last 1 Encounters:  06/11/15 237 lb 10.5 oz (107.8 kg)    Ideal Body Weight:  56.82 kg (kg)  BMI:  Body mass index is 39.55 kg/(m^2).  Estimated Nutritional Needs:   Kcal:  1850-2035 (20-22 kcal/kg)  Protein:  110-120 grams  Fluid:  1.9-2.1 L/day  EDUCATION NEEDS:   No education needs identified at this time  Tilda FrancoLindsey Norely Schlick, MS, RD, LDN Pager: [phone] After Hours Pager: [phone]

## 2015-06-12 ENCOUNTER — Inpatient Hospital Stay (HOSPITAL_COMMUNITY): Payer: Medicare Other

## 2015-06-12 LAB — FIBRINOGEN: FIBRINOGEN: 406 mg/dL (ref 204–475)

## 2015-06-12 LAB — CBC WITH DIFFERENTIAL/PLATELET
BASOS ABS: 0 10*3/uL (ref 0.0–0.1)
BASOS PCT: 0 %
EOS PCT: 4 %
Eosinophils Absolute: 0.3 10*3/uL (ref 0.0–0.7)
HEMATOCRIT: 27.7 % — AB (ref 36.0–46.0)
HEMOGLOBIN: 8.8 g/dL — AB (ref 12.0–15.0)
Lymphocytes Relative: 16 %
Lymphs Abs: 1.2 10*3/uL (ref 0.7–4.0)
MCH: 29.8 pg (ref 26.0–34.0)
MCHC: 31.8 g/dL (ref 30.0–36.0)
MCV: 93.9 fL (ref 78.0–100.0)
MONO ABS: 0.6 10*3/uL (ref 0.1–1.0)
Monocytes Relative: 8 %
Neutro Abs: 5.4 10*3/uL (ref 1.7–7.7)
Neutrophils Relative %: 72 %
Platelets: 131 10*3/uL — ABNORMAL LOW (ref 150–400)
RBC: 2.95 MIL/uL — ABNORMAL LOW (ref 3.87–5.11)
RDW: 19.2 % — ABNORMAL HIGH (ref 11.5–15.5)
WBC: 7.6 10*3/uL (ref 4.0–10.5)

## 2015-06-12 LAB — COMPREHENSIVE METABOLIC PANEL
ALBUMIN: 1.9 g/dL — AB (ref 3.5–5.0)
ALK PHOS: 86 U/L (ref 38–126)
ALT: 21 U/L (ref 14–54)
ANION GAP: 6 (ref 5–15)
AST: 34 U/L (ref 15–41)
BILIRUBIN TOTAL: 0.6 mg/dL (ref 0.3–1.2)
BUN: 20 mg/dL (ref 6–20)
CALCIUM: 7.7 mg/dL — AB (ref 8.9–10.3)
CO2: 33 mmol/L — AB (ref 22–32)
Chloride: 105 mmol/L (ref 101–111)
Creatinine, Ser: 1.13 mg/dL — ABNORMAL HIGH (ref 0.44–1.00)
GFR calc Af Amer: 51 mL/min — ABNORMAL LOW (ref >60)
GFR calc non Af Amer: 44 mL/min — ABNORMAL LOW (ref >60)
GLUCOSE: 137 mg/dL — AB (ref 65–99)
POTASSIUM: 3.2 mmol/L — AB (ref 3.5–5.1)
SODIUM: 144 mmol/L (ref 135–145)
TOTAL PROTEIN: 5.2 g/dL — AB (ref 6.5–8.1)

## 2015-06-12 LAB — MAGNESIUM: MAGNESIUM: 1.8 mg/dL (ref 1.7–2.4)

## 2015-06-12 LAB — PROTIME-INR
INR: 0.95 (ref 0.00–1.49)
PROTHROMBIN TIME: 12.9 s (ref 11.6–15.2)

## 2015-06-12 LAB — GLUCOSE, CAPILLARY
GLUCOSE-CAPILLARY: 114 mg/dL — AB (ref 65–99)
GLUCOSE-CAPILLARY: 129 mg/dL — AB (ref 65–99)
GLUCOSE-CAPILLARY: 146 mg/dL — AB (ref 65–99)
Glucose-Capillary: 140 mg/dL — ABNORMAL HIGH (ref 65–99)

## 2015-06-12 LAB — PHOSPHORUS: Phosphorus: 2.9 mg/dL (ref 2.5–4.6)

## 2015-06-12 LAB — APTT: APTT: 30 s (ref 24–37)

## 2015-06-12 MED ORDER — POTASSIUM CHLORIDE CRYS ER 20 MEQ PO TBCR
40.0000 meq | EXTENDED_RELEASE_TABLET | Freq: Four times a day (QID) | ORAL | Status: AC
  Administered 2015-06-12 (×2): 40 meq via ORAL
  Filled 2015-06-12 (×2): qty 2

## 2015-06-12 MED ORDER — FUROSEMIDE 40 MG PO TABS
40.0000 mg | ORAL_TABLET | Freq: Two times a day (BID) | ORAL | Status: DC
  Administered 2015-06-12 – 2015-06-14 (×4): 40 mg via ORAL
  Filled 2015-06-12 (×6): qty 1

## 2015-06-12 MED ORDER — SODIUM CHLORIDE 0.9% FLUSH
10.0000 mL | INTRAVENOUS | Status: DC | PRN
  Administered 2015-06-13 – 2015-06-14 (×3): 10 mL
  Administered 2015-06-16 – 2015-06-17 (×2): 20 mL
  Administered 2015-06-18 (×3): 10 mL
  Filled 2015-06-12 (×8): qty 40

## 2015-06-12 MED ORDER — TRACE MINERALS CR-CU-MN-SE-ZN 10-1000-500-60 MCG/ML IV SOLN
INTRAVENOUS | Status: AC
  Administered 2015-06-12: 17:00:00 via INTRAVENOUS
  Filled 2015-06-12: qty 1992

## 2015-06-12 MED ORDER — SODIUM CHLORIDE 0.9% FLUSH
10.0000 mL | Freq: Two times a day (BID) | INTRAVENOUS | Status: DC
  Administered 2015-06-12 – 2015-06-17 (×5): 10 mL
  Administered 2015-06-18 – 2015-06-19 (×2): 20 mL
  Administered 2015-06-20: 10 mL

## 2015-06-12 NOTE — Interval H&P Note (Signed)
History and Physical Interval Note: No interval changes. Will proceed with TEE as planned.  06/12/2015 10:38 AM  Jasmine Newman  has presented today for surgery, with the diagnosis of BACTEREMIA  The various methods of treatment have been discussed with the patient and family. After consideration of risks, benefits and other options for treatment, the patient has consented to  Procedure(s): TRANSESOPHAGEAL ECHOCARDIOGRAM (TEE) (N/A) as a surgical intervention .  The patient's history has been reviewed, patient examined, no change in status, stable for surgery.  I have reviewed the patient's chart and labs.  Questions were answered to the patient's satisfaction.     Prentice DockerKONESWARAN, Rieley Hausman A

## 2015-06-12 NOTE — H&P (View-Only) (Signed)
Asked to speak with patient re: TEE for bacteremia.  Pt currently does not wish to have the procedure done, but she will consider it if her husband feels it is needed. Will discuss this with him when he is available.   Currently, respiratory status will not permit the procedure.  Tentatively scheduled for 04/13, can move further out or cancel if needed. Will write orders once final decision made.  Theodore DemarkBarrett, Sya Nestler, Cordelia Poche-C 06/10/2015 9:25 AM Beeper [phone]

## 2015-06-12 NOTE — Progress Notes (Signed)
[facility] PROGRESS NOTE  Jasmine Newman Ashby KXF:[id number] DOB: 07-31-1931 DOA: 06/01/2015 PCP: No primary care provider on file.  Interim summary/brief H&P 82 year old with past mental history of hypothyroidism, hypertension, hyperlipidemia, depression. Admitted today to the ED after she had a fall at home. She is watching the [facility] basketball game with her husband. When she stood up to use the bathroom her legs gave out and she fell on the right side. She hurt her leg and chest. She did not hit her head and there was no loss of consciousness. She stayed on the floor the whole night. When her son arrived next morning she was found to be lying in a large pool of melanotic stool. In the ED she was found to be hypertensive with hemoglobin of 7.4 (baseline unknown). She continued to be hypotensive despite of 2 L of fluid. GI evaluated the patient. PCCM called for admission. Patient ended experiencing need for intubation and pressors; initially failed  IR approach for embolization and subsequent required surgery. Hemodynamically stable and with good O2 sat currently. Course complicated with enterococcus bacteremia and either NSTEMI vs demand ischemia. Needs TEE. ID, CCS and cardiology on board.  Assessment/Plan: 1 Respiratory failure with hypoxia: required intubation until 06/08/15 -Suspect secondary to multiple factors including pulmonary edema -Continues to have extensive bilateral crackles, a. Volume overload on physical examination. -Chest x-ray on 06/07/2015 had revealed interstitial and airspace opacities that could be consistent with edema. -Repeat chest x-ray on 06/11/2015 pending -On 06/11/2015 given ongoing evidence of volume overload including having extensive crackles on physical examination will increase Lasix to 40 mg IV twice a day -On my assessment on 06/12/2015 she appears improved with decrease in crackles. She had urine output of 6.4 L Kidney function stable having creatinine 1.13  with BUN of 20.Transition to Lasix 40 mg by mouth twice daily on this date.  2 Shock: sepsis vs secondary to upper GI bleed -resolved -BP stable -She received aggressive IV fluid resuscitation initially -Showing overall clinical improvement. Required IV Lasix for volume overload  3 Aortic stenosis: s/p bioprosthetic valve -Transesophageal echocardiogram planned for 06/12/2015, having blood cultures growing enterococcus from 06/01/2015 and 06/04/2015 -keep good BP control  4 Suspect demand ischemia -She presented with septic shock, GI bleed requiring pressor support initially -2-D echo with wall motion abnormalities -cardiology consulted  -not on ASA due to GIB; not on b-blockers or ACE, given intermittent episodes of shock and renal failure  5.  Upper GI bleed secondary to large duodenal ulcer -She presented with melanotic stools, EGD performed on 06/02/2015 revealed a large 3 cm posterior wall duodenal ulcer that had a large adherent clot. Initially injected with epinephrine. She continued to have upper GI bleed, interventional radiology consulted undergoing coil embolization on 06/05/2015. -She re-bled for which general surgery was consulted and evaluated her on 06/06/2015. She was taken to the operating room on 06/06/2015 where she underwent exploratory laparotomy with oversewing of duodenal ulcer. -She has required blood transfusions during this hospitalization -Lab work on 06/11/2015 show a hemoglobin of 8.3 with hematocrit of 24.9 -continue PPI -On 06/12/2015 she had been tolerating clears for which her diet was further advanced to full liquids. Discussed case with general surgery.  6 Acute kidney injury: due to shock episodes and decrease perfusion -Labs on 06/12/2015 showing creatinine 1.13 with BUN of 20.  7 Acute blood loss anemia -Secondary to upper GI bleed in setting of large duodenal ulcer -transfusion as needed; goal is for Hgb > 8 -follow Hgb  and platelets trend -not  active bleeding currently  8-Sepsis: secondary to enterococcus bacteremia -Present on admission  -Blood cultures obtained on 06/01/2015 and 06/04/2015 growing enterococcus species -Transesophageal echocardiogram had been planned for 06/12/2015, however, patient declined procedure. -continue ampicillin; ID on board  9 Left heel ulcer: present prior to admission -follow WOC recommendations -continue prevlon boots and preventive measurements   10 Modertae protein calorie malnutrition  -Remains TPN for now  11. Hypokalemia -A.m. labs showing potassium of 3.2, likely secondary to diuretic therapy. -Will provide Kdur 40 meq by mouth 2  Code Status: Full Family Communication: no family at bedside  Disposition Plan: remains in stepdown;   Consultants:  PCCM  CCS  ID  Cardiology (for TEE)  Procedures/studies: X ray rib 4/2: No fractures Port CXR 4/2: Questionable right basal hazy opacity. R IJ CVL in mid-SVC. Chronic elevation left hemidiaphragm.  R Knee 2View 4/2: No fracture or dislocation. Port CXR 4/4: Silhouetting of left hemidiaphragm unchanged. Endotracheal tube and central venous catheter in good position. No new focal opacity. TTE 4/4: LV normal in size with EF 45-50%. Akinesis of anterior septal myocardium. Grade 2 diastolic dysfunction. LA & RA normal in size. RV normal in size and function. PASP 29mHg. Moderate aortic stenosis w/ bioprosthetic aortic valve without regurgitation. Severe mitral vegetation without stenosis. Trivial pulmonic regurgitation without stenosis. Moderate tricuspid regurgitation. Port CXR 4/8: Endotracheal tube and central line in good position. Low lung volumes. Hazy opacification bilaterally.  Antibiotics:  Ampicillin   HPI/Subjective: Overall she seems improved she is awake and alert following commands, tolerating clear liquids  Objective: Filed Vitals:   06/12/15 1200 06/12/15 1300  BP: 126/53 102/50  Pulse: 66 72  Temp: 97.5 F  (36.4 C) 97.7 F (36.5 C)  Resp: 14 14    Intake/Output Summary (Last 24 hours) at 06/12/15 1521 Last data filed at 06/12/15 1435  Gross per 24 hour  Intake 4257.06 ml  Output   6100 ml  Net -1842.94 ml   Filed Weights   06/10/15 0154 06/11/15 0500 06/12/15 0700  Weight: 108.7 kg (239 lb 10.2 oz) 107.8 kg (237 lb 10.5 oz) 104.2 kg (229 lb 11.5 oz)    Exam:   General:  Afebrile, complaining of soreness in her abd, no nausea, no vomiting. Reports some SOB, but with good O2 sat on 2L.  Cardiovascular: rate controlled currently, no rubs or gallops, positive JVD, positive SM  Respiratory: Improvement to crackles, no wheezing or rales  Abdomen: soft, positive BS, drain in place, mild tenderness with palpation; no guarding   Musculoskeletal: 1+ edema and left heel ulcer appreciated; no cyanosis; SCD's in place  Data Reviewed: Basic Metabolic Panel:  Recent Labs Lab 06/08/15 0544 06/09/15 0430 06/10/15 0135 06/11/15 0440 06/12/15 0330  NA 151* 149* 152* 151* 144  K 3.3* 3.4* 3.7 3.1* 3.2*  CL 118* 119* 121* 116* 105  CO2 24 24 23 30  33*  GLUCOSE 89 111* 127* 135* 137*  BUN 11 10 11 14 20   CREATININE 1.27* 1.37* 1.24* 1.22* 1.13*  CALCIUM 7.7* 7.8* 8.0* 8.1* 7.7*  MG 1.7 2.1 2.1 2.2 1.8  PHOS 3.1 3.3 3.2 2.9 2.9   Liver Function Tests:  Recent Labs Lab 06/06/15 1623  06/08/15 0544 06/09/15 0430 06/10/15 0135 06/11/15 0440 06/12/15 0330  AST 18  --   --   --  23  --  34  ALT 16  --   --   --  16  --  21  ALKPHOS 35*  --   --   --  65  --  86  BILITOT 0.5  --   --   --  0.8  --  0.6  PROT 3.4*  --   --   --  5.4*  --  5.2*  ALBUMIN 1.7*  < > 1.9* 2.1* 2.2* 1.9* 1.9*  < > = values in this interval not displayed. CBC:  Recent Labs Lab 06/09/15 0430 06/09/15 2000 06/10/15 0135 06/11/15 0440 06/12/15 0330  WBC 9.2 9.4 9.1 6.8 7.6  NEUTROABS 6.6 7.6 7.2 5.0 5.4  HGB 8.5* 8.8* 9.0* 8.3* 8.8*  HCT 24.7* 26.0* 26.8* 24.9* 27.7*  MCV 87.3 88.7 89.3 91.2  93.9  PLT 130* 128* 133* 125* 131*   Cardiac Enzymes: No results for input(s): CKTOTAL, CKMB, CKMBINDEX, TROPONINI in the last 168 hours. BNP (last 3 results)  Recent Labs  06/01/15 2100  BNP 329.7*   CBG:  Recent Labs Lab 06/11/15 0732 06/11/15 1515 06/12/15 0025 06/12/15 0418 06/12/15 0722  GLUCAP 128* 130* 140* 114* 129*    Recent Results (from the past 240 hour(s))  Culture, blood (routine x 2)     Status: None   Collection Time: 06/04/15 11:10 AM  Result Value Ref Range Status   Specimen Description BLOOD RIGHT HAND  Final   Special Requests IN PEDIATRIC BOTTLE 4CC  Final   Culture   Final    NO GROWTH 5 DAYS Performed at [facility]    Report Status 06/09/2015 FINAL  Final  Culture, blood (routine x 2)     Status: Abnormal   Collection Time: 06/04/15 11:11 AM  Result Value Ref Range Status   Specimen Description BLOOD LEFT HAND  Final   Special Requests IN PEDIATRIC BOTTLE 3CC  Final   Culture  Setup Time   Final    GRAM POSITIVE COCCI IN PAIRS IN CHAINS AEROBIC BOTTLE ONLY CRITICAL RESULT CALLED TO, READ BACK BY AND VERIFIED WITHBaldo Daub RN 1900 06/05/15 A BROWNING    Culture (A)  Final    ENTEROCOCCUS SPECIES SUSCEPTIBILITIES PERFORMED ON PREVIOUS CULTURE WITHIN THE LAST 5 DAYS. Performed at [facility]    Report Status 06/07/2015 FINAL  Final  Culture, blood (routine x 2)     Status: None (Preliminary result)   Collection Time: 06/10/15  1:30 AM  Result Value Ref Range Status   Specimen Description BLOOD BLOOD RIGHT WRIST  Final   Special Requests IN PEDIATRIC BOTTLE [place]  Final   Culture   Final    NO GROWTH 2 DAYS Performed at [facility]    Report Status PENDING  Incomplete  Culture, blood (routine x 2)     Status: None (Preliminary result)   Collection Time: 06/10/15  1:35 AM  Result Value Ref Range Status   Specimen Description BLOOD RIGHT HAND  Final   Special Requests BOTTLES DRAWN AEROBIC AND ANAEROBIC 5CC   Final   Culture   Final    NO GROWTH 2 DAYS Performed at [facility]    Report Status PENDING  Incomplete     Studies: Dg Chest Port 1 View  06/11/2015  CLINICAL DATA:  Shortness of breath. EXAM: PORTABLE CHEST 1 VIEW COMPARISON:  06/07/2015. FINDINGS: Interim extubation removal of NG tube. Right IJ line in stable position. Prior CABG. Cardiomegaly with persistent bilateral from interstitial prominence and bilateral pleural effusions, left side greater right. Findings consistent congestive heart failure. Slight worsening from prior exam.  No pneumothorax. IMPRESSION: 1. Interim extubation removal of NG tube. Right IJ line in stable position. 2. Cardiomegaly with diffuse bilateral from interstitial prominence left side greater right. Bilateral pleural effusions left side greater right. Findings consistent congestive heart failure. Findings have progressed from prior exam . Electronically Signed   By: [place]   On: 06/11/2015 07:12    Scheduled Meds: . ampicillin (OMNIPEN) IV  2 g Intravenous Q6H  . antiseptic oral rinse  7 mL Mouth Rinse QID  . chlorhexidine gluconate (SAGE KIT)  15 mL Mouth Rinse BID  . furosemide  40 mg Intravenous BID  . insulin aspart  0-9 Units Subcutaneous Q8H  . levothyroxine  25 mcg Intravenous QAC breakfast  . pantoprazole (PROTONIX) IV  40 mg Intravenous Q12H  . sodium chloride flush  10-40 mL Intracatheter Q12H   Continuous Infusions: . Marland KitchenTPN (CLINIMIX-E) Adult 83 mL/hr at 06/11/15 1742  . Marland KitchenTPN (CLINIMIX-E) Adult    . dextrose 10 mL/hr at 06/11/15 1801    Active Problems:   GI bleed   Pressure ulcer   Gastrointestinal hemorrhage associated with duodenal ulcer   Hypovolemic shock (HCC)   Bacteremia   NSTEMI (non-ST elevated myocardial infarction) ([place])   Malnutrition of moderate degree   Time spent: 35 minutes (> 50% dedicated to face to face examination, coordination of care and intercommunication with consultants)   Kelvin Cellar  [facility] Pager [phone]. If 7PM-7AM, please contact night-coverage at [email or web address], password [email or web address] 06/12/2015, 3:21 PM  LOS: 11 days

## 2015-06-12 NOTE — Progress Notes (Signed)
[facility]:  [phone] General Surgery Progress Note   LOS: 11 days  POD -  6 Days Post-Op  Assessment/Plan: 1.  EXPLORATORY LAPAROTOMY OVERSEWING OF BLEEDING DUODENAL ULCER - 06/06/2015 - [place]  On ampicillin  On clear liquids -> to advance to full liquids  2.  NSTEMI (non-ST elevated myocardial infarction)   History of bioprosthetic valve 3.  Anemia -   Hgb - 8.8 - 06/12/2015 4.  Creat  1.13 - 06/12/2015 5.  Clean ulcer left heel 6.  Bedridden at home  Sits up, but cannot walk 7.  Malnourished  Albumin - 1.7 from 06/06/2015  Started TPN on 06/09/2015 -> with advancing diet, this can be tappered 8.  DVT prophylaxis - on hold because of bleed   Active Problems:   GI bleed   Pressure ulcer   Gastrointestinal hemorrhage associated with duodenal ulcer   Hypovolemic shock (HCC)   Bacteremia   NSTEMI (non-ST elevated myocardial infarction) (HCC)   Malnutrition of moderate degree   Subjective:  Tolerating po's okay.  No specific complaint.  Objective:   Filed Vitals:   06/12/15 0500 06/12/15 0600  BP: 138/35 106/39  Pulse: 63 66  Temp: 97.7 F (36.5 C) 97.9 F (36.6 C)  Resp: 14 14     Intake/Output from previous day:  04/12 0701 - 04/13 0700 In: 2064.8 [P.O.:480; I.V.:644.8; IV Piggyback:400; TPN:540] Out: 6500 [UJWJX:9147[Urine:6440; Drains:60]  Intake/Output this shift:      Physical Exam:   General: Obese older WF who is alert and oriented.    HEENT: Normal. Pupils equal.   .   Lungs: clear   Abdomen: Soft.  Has BS.   Wound: Clean.  Drain in RUQ - 60 cc last 24 hours.   Lab Results:     Recent Labs  06/11/15 0440 06/12/15 0330  WBC 6.8 7.6  HGB 8.3* 8.8*  HCT 24.9* 27.7*  PLT 125* 131*    BMET    Recent Labs  06/11/15 0440 06/12/15 0330  NA 151* 144  K 3.1* 3.2*  CL 116* 105  CO2 30 33*  GLUCOSE 135* 137*  BUN 14 20  CREATININE 1.22* 1.13*  CALCIUM 8.1* 7.7*    PT/INR    Recent Labs  06/11/15 0440  06/12/15 0330  LABPROT 14.2 12.9  INR 1.08 0.95    ABG  No results for input(s): PHART, HCO3 in the last 72 hours.  Invalid input(s): PCO2, PO2   Studies/Results:  Dg Chest Port 1 View  06/11/2015  CLINICAL DATA:  Shortness of breath. EXAM: PORTABLE CHEST 1 VIEW COMPARISON:  06/07/2015. FINDINGS: Interim extubation removal of NG tube. Right IJ line in stable position. Prior CABG. Cardiomegaly with persistent bilateral from interstitial prominence and bilateral pleural effusions, left side greater right. Findings consistent congestive heart failure. Slight worsening from prior exam. No pneumothorax. IMPRESSION: 1. Interim extubation removal of NG tube. Right IJ line in stable position. 2. Cardiomegaly with diffuse bilateral from interstitial prominence left side greater right. Bilateral pleural effusions left side greater right. Findings consistent congestive heart failure. Findings have progressed from prior exam . Electronically Signed   By: Maisie Fushomas  Register   On: 06/11/2015 07:12     Anti-infectives:   Anti-infectives    Start     Dose/Rate Route Frequency Ordered Stop   06/06/15 1400  ampicillin (OMNIPEN) 2 g in sodium chloride 0.9 % 50 mL IVPB     2 g 150 mL/hr over 20 Minutes Intravenous Every 6 hours  06/06/15 0959     06/04/15 1000  vancomycin (VANCOCIN) 1,250 mg in sodium chloride 0.9 % 250 mL IVPB  Status:  Discontinued     1,250 mg 166.7 mL/hr over 90 Minutes Intravenous Every 24 hours 06/04/15 0901 06/04/15 0928   06/04/15 1000  ampicillin (OMNIPEN) 2 g in sodium chloride 0.9 % 50 mL IVPB  Status:  Discontinued     2 g 150 mL/hr over 20 Minutes Intravenous 6 times per day 06/04/15 0929 06/06/15 0959   06/02/15 0630  cefTRIAXone (ROCEPHIN) 2 g in dextrose 5 % 50 mL IVPB  Status:  Discontinued     2 g 100 mL/hr over 30 Minutes Intravenous Daily 06/02/15 0613 06/04/15 0928   06/02/15 0630  vancomycin (VANCOCIN) IVPB 1000 mg/200 mL premix  Status:  Discontinued     1,000 mg 200  mL/hr over 60 Minutes Intravenous Daily 06/02/15 0619 06/04/15 0901   06/02/15 0600  ampicillin-sulbactam (UNASYN) 1.5 g in sodium chloride 0.9 % 50 mL IVPB  Status:  Discontinued     1.5 g 100 mL/hr over 30 Minutes Intravenous Every 12 hours 06/01/15 1555 06/02/15 0613   06/01/15 1700  ampicillin-sulbactam (UNASYN) 1.5 g in sodium chloride 0.9 % 50 mL IVPB     1.5 g 100 mL/hr over 30 Minutes Intravenous  Once 06/01/15 1554 06/01/15 1726      Ovidio Kin, MD, FACS Pager: [phone] [facility] Office: [phone] 06/12/2015

## 2015-06-12 NOTE — Progress Notes (Signed)
Pt not wanting TEE at the moment, she is wishing to have her husband to decide. He is not available at the moment. Jasmine Newman with [facility] called and she said pt may be moved to Monday until decision is made.

## 2015-06-12 NOTE — Progress Notes (Signed)
PARENTERAL NUTRITION CONSULT NOTE  Pharmacy Consult for TPN Indication: prolonged NPO, s/p duodenal ulcer oversewing  No Known Allergies  Patient Measurements: Height:  (165.1 cm) Weight: 229 lb 11.5 oz (104.2 kg) IBW/kg (Calculated) : 57 BMI 40  Vital Signs: Temp: 97.7 F (36.5 C) (04/13 0700) BP: 121/96 mmHg (04/13 0700) Pulse Rate: 109 (04/13 0700) Intake/Output from previous day: 04/12 0701 - 04/13 0700 In: 2064.8 [P.O.:480; I.V.:644.8; IV Piggyback:400; TPN:540] Out: 6500 [ZOXWR:6045; Drains:60] Intake/Output from this shift:    Labs:  Recent Labs  06/10/15 0135 06/11/15 0440 06/12/15 0330  WBC 9.1 6.8 7.6  HGB 9.0* 8.3* 8.8*  HCT 26.8* 24.9* 27.7*  PLT 133* 125* 131*  APTT 33 35 30  INR 1.27 1.08 0.95     Recent Labs  06/10/15 0135 06/11/15 0440 06/12/15 0330  NA 152* 151* 144  K 3.7 3.1* 3.2*  CL 121* 116* 105  CO2 23 30 33*  GLUCOSE 127* 135* 137*  BUN CREATININE 1.24* 1.22* 1.13*  CALCIUM 8.0* 8.1* 7.7*  MG 2.1 2.2 1.8  PHOS 3.2 2.9 2.9  PROT 5.4*  --  5.2*  ALBUMIN 2.2* 1.9* 1.9*  AST 23  --  34  ALT 16  --  21  ALKPHOS 65  --  86  BILITOT 0.8  --  0.6  PREALBUMIN 11.5*  --   --   TRIG 134  --   --    Estimated Creatinine Clearance: 46.8 mL/min (by C-G formula based on Cr of 1.13).    Recent Labs  06/12/15 0025 06/12/15 0418 06/12/15 0722  GLUCAP 140* 114* 129*    Medical History: Past Medical History  Diagnosis Date  . Hypertension     Medications:  Infusions:  . Marland KitchenTPN (CLINIMIX-E) Adult 83 mL/hr at 06/11/15 1742  . dextrose 10 mL/hr at 06/11/15 1801    Insulin Requirements in the past 24 hours: 4 units SSI  Current Nutrition: Full liquid diet  IVF: D5 @ KVO  Central access: Rt IJ CVL (4/2) TPN start date: 4/10  ASSESSMENT                                                                                                          HPI: 76 yoF admitted on 4/2 with GIB and hypotension; s/p failed  arterial embolization 4/6, requiring surgical oversewing of bleeding duodenal ulcer 06/06/15. She remains NPO with malnutrition, unable to meet needs, and is high risk for refeeding.  Surgery may allow PO intake 5 days postop.  Note significant weight increase from 86 kg on admission to 109 kg, I/O unreliable when stool volume is unmeasured.  Pharmacy has been consulted to dose TPN.  Significant events:  4/10 PM - 4/11 AM Overnight, nursing notes indicate 2 dark bloody BM 4/11 Concern for fluid overload (wt increase of 23 kg since admission, UOP yesterday averaged 0.45 ml/kg/hr).  Lasix added, CXR pending, IVF adjusted for TPN volume. 4/12 Remains fluid overload but had excellent UOP yesterday (1.25 ml/kg/hr, I/O - 3.25L) and lasix dose  increased today. 4/13 I/O -4.4L yesterday.  She has tolerated CLD and is advanced to full liquids today.  Today:   Glucose: meeting goal < 150 with minimal SSI.  CBGs: 114-140  Electrolytes: Na and Cl improved to WNL.  K low (MD ordered PO replacement).  Corr Ca 9.4, Mag, and Phos WNL.  Renal:  SCr elevated but improved  LFTs: WNL (4/13)  TGs:  134  Prealbumin: 11.5  NUTRITIONAL GOALS                                                                                             RD recs (4/10): 1850-2035 kcal/day, 110-120 g protein/day Recommendations based on admission weight which is still ~ 150% of ideal weight.  If pt shows signs of overfeeding, consider recalculating using adjusted weight, sinceTBW > 125% IBW.  Admission weight 86kg, adjusted weight 64 kg.  Current weight 104 kg, adjusted weight 69 kg.  Based on published guidelines, while the patient meets ICU status the initiation of lipids is being delayed for 7 days or until transition out of ICU. Planned start date for lipids is 4/17.  Goal will be to meet 100% of the patient's protein needs and approximately 80% of their caloric needs.  Clinimix 5/20 @ 83 mL/hr to provide 100 grams protein (91% of  minimum goal) and 1753 kcal (95% of minimum goal)    PLAN                                                                                                                         At 1800 today:  Continue Clinimix E 5/20 at 83 ml/hr  HOLD lipids (may start on 4/17 or when no longer ICU status)   TPN to contain standard multivitamins and trace elements.  [facility]  Continue sensitive SSI and CBGs q8h  TPN lab panels on Mondays & Thursdays.  F/u daily; f/u PO intake and if tolerating PO diet.  Lynann Beaverhristine Bryce Kimble PharmD, BCPS Pager [phone] 06/12/2015 8:49 AM

## 2015-06-13 DIAGNOSIS — D62 Acute posthemorrhagic anemia: Secondary | ICD-10-CM | POA: Insufficient documentation

## 2015-06-13 LAB — MAGNESIUM: MAGNESIUM: 2 mg/dL (ref 1.7–2.4)

## 2015-06-13 LAB — CBC WITH DIFFERENTIAL/PLATELET
BASOS PCT: 0 %
Basophils Absolute: 0 10*3/uL (ref 0.0–0.1)
EOS ABS: 0.3 10*3/uL (ref 0.0–0.7)
Eosinophils Relative: 3 %
HCT: 25.9 % — ABNORMAL LOW (ref 36.0–46.0)
Hemoglobin: 8.2 g/dL — ABNORMAL LOW (ref 12.0–15.0)
Lymphocytes Relative: 13 %
Lymphs Abs: 1.2 10*3/uL (ref 0.7–4.0)
MCH: 30 pg (ref 26.0–34.0)
MCHC: 31.7 g/dL (ref 30.0–36.0)
MCV: 94.9 fL (ref 78.0–100.0)
MONO ABS: 0.8 10*3/uL (ref 0.1–1.0)
MONOS PCT: 9 %
Neutro Abs: 6.6 10*3/uL (ref 1.7–7.7)
Neutrophils Relative %: 75 %
PLATELETS: 129 10*3/uL — AB (ref 150–400)
RBC: 2.73 MIL/uL — ABNORMAL LOW (ref 3.87–5.11)
RDW: 18.7 % — AB (ref 11.5–15.5)
WBC: 8.9 10*3/uL (ref 4.0–10.5)

## 2015-06-13 LAB — RENAL FUNCTION PANEL
ALBUMIN: 1.7 g/dL — AB (ref 3.5–5.0)
ANION GAP: 4 — AB (ref 5–15)
BUN: 27 mg/dL — ABNORMAL HIGH (ref 6–20)
CHLORIDE: 101 mmol/L (ref 101–111)
CO2: 38 mmol/L — ABNORMAL HIGH (ref 22–32)
Calcium: 7.9 mg/dL — ABNORMAL LOW (ref 8.9–10.3)
Creatinine, Ser: 1.16 mg/dL — ABNORMAL HIGH (ref 0.44–1.00)
GFR calc Af Amer: 50 mL/min — ABNORMAL LOW (ref >60)
GFR, EST NON AFRICAN AMERICAN: 43 mL/min — AB (ref >60)
GLUCOSE: 148 mg/dL — AB (ref 65–99)
PHOSPHORUS: 3.1 mg/dL (ref 2.5–4.6)
POTASSIUM: 3.8 mmol/L (ref 3.5–5.1)
Sodium: 143 mmol/L (ref 135–145)

## 2015-06-13 LAB — APTT: APTT: 36 s (ref 24–37)

## 2015-06-13 LAB — GLUCOSE, CAPILLARY
GLUCOSE-CAPILLARY: 122 mg/dL — AB (ref 65–99)
Glucose-Capillary: 146 mg/dL — ABNORMAL HIGH (ref 65–99)
Glucose-Capillary: 126 mg/dL — ABNORMAL HIGH (ref 65–99)
Glucose-Capillary: 112 mg/dL — ABNORMAL HIGH (ref 65–99)

## 2015-06-13 LAB — FIBRINOGEN: Fibrinogen: 442 mg/dL (ref 204–475)

## 2015-06-13 LAB — PROTIME-INR
INR: 1.03 (ref 0.00–1.49)
Prothrombin Time: 13.7 seconds (ref 11.6–15.2)

## 2015-06-13 MED ORDER — TRACE MINERALS CR-CU-MN-SE-ZN 10-1000-500-60 MCG/ML IV SOLN
INTRAVENOUS | Status: AC
  Administered 2015-06-13: 18:00:00 via INTRAVENOUS
  Filled 2015-06-13: qty 1200

## 2015-06-13 MED ORDER — LEVOTHYROXINE SODIUM 50 MCG PO TABS
50.0000 ug | ORAL_TABLET | Freq: Every day | ORAL | Status: DC
  Administered 2015-06-13 – 2015-06-20 (×8): 50 ug via ORAL
  Filled 2015-06-13 (×10): qty 1

## 2015-06-13 NOTE — Progress Notes (Signed)
    [facility]   Reason for visit: Follow up on Enterococcal bacteremia  Interval History:  repeat blood culture now negative 3 days.  Unable and unwilling to do TEE at this time, potentially Monday.   Physical Exam: Constitutional: up in chair Filed Vitals:   06/13/15 0700 06/13/15 0800  BP: 97/53 116/69  Pulse: 78 78  Temp: 97.9 F (36.6 C) 97.7 F (36.5 C)  Resp: 14 17  CARDIOVASCULAR: Tachy RR Skin: no rashes Resp: + rhconchi  Impression: Enterococcus, likely still GI source with ongoing bleed. Improving.  History of bioprosthetic valve, significant calcifications.   Plan: 1.  Continue ampicillin TEE If/when able May otherwise just get prolonged course of ampicillin in IV for 28 days total from 4/11 if no TEE done.   2. Bleed - may have been iniial source of #1.    Dr. Orvan Falconerampbell is available over the weekend if needed, I will follow up on Monday

## 2015-06-13 NOTE — Progress Notes (Signed)
7 Days Post-Op  Subjective: Feels better   Objective: Vital signs in last 24 hours: Temp:  [97.3 F (36.3 C)-98.2 F (36.8 C)] 97.7 F (36.5 C) (04/14 0600) Pulse Rate:  [32-82] 67 (04/14 0600) Resp:  [12-22] 14 (04/14 0600) BP: (70-136)/(33-97) 85/51 mmHg (04/14 0600) SpO2:  [96 %-100 %] 100 % (04/14 0600) Weight:  [104.8 kg (231 lb 0.7 oz)] 104.8 kg (231 lb 0.7 oz) (04/14 0340) Last BM Date: 06/12/15  Intake/Output from previous day: 04/13 0701 - 04/14 0700 In: 4958.2 [P.O.:240; I.V.:1402.3; IV Piggyback:200; TPN:3115.9] Out: 5640 [Urine:5400; Drains:240] Intake/Output this shift:    Incision/Wound:CDI  Soft ND  JP serous minimal output   Lab Results:   Recent Labs  06/12/15 0330 06/13/15 0510  WBC 7.6 8.9  HGB 8.8* 8.2*  HCT 27.7* 25.9*  PLT 131* 129*   BMET  Recent Labs  06/12/15 0330 06/13/15 0510  NA 144 143  K 3.2* 3.8  CL 105 101  CO2 33* 38*  GLUCOSE 137* 148*  BUN 20 27*  CREATININE 1.13* 1.16*  CALCIUM 7.7* 7.9*   PT/INR  Recent Labs  06/12/15 0330 06/13/15 0510  LABPROT 12.9 13.7  INR 0.95 1.03   ABG No results for input(s): PHART, HCO3 in the last 72 hours.  Invalid input(s): PCO2, PO2  Studies/Results: No results found.  Anti-infectives: Anti-infectives    Start     Dose/Rate Route Frequency Ordered Stop   06/06/15 1400  ampicillin (OMNIPEN) 2 g in sodium chloride 0.9 % 50 mL IVPB     2 g 150 mL/hr over 20 Minutes Intravenous Every 6 hours 06/06/15 0959     06/04/15 1000  vancomycin (VANCOCIN) 1,250 mg in sodium chloride 0.9 % 250 mL IVPB  Status:  Discontinued     1,250 mg 166.7 mL/hr over 90 Minutes Intravenous Every 24 hours 06/04/15 0901 06/04/15 0928   06/04/15 1000  ampicillin (OMNIPEN) 2 g in sodium chloride 0.9 % 50 mL IVPB  Status:  Discontinued     2 g 150 mL/hr over 20 Minutes Intravenous 6 times per day 06/04/15 0929 06/06/15 0959   06/02/15 0630  cefTRIAXone (ROCEPHIN) 2 g in dextrose 5 % 50 mL IVPB  Status:   Discontinued     2 g 100 mL/hr over 30 Minutes Intravenous Daily 06/02/15 0613 06/04/15 0928   06/02/15 0630  vancomycin (VANCOCIN) IVPB 1000 mg/200 mL premix  Status:  Discontinued     1,000 mg 200 mL/hr over 60 Minutes Intravenous Daily 06/02/15 0619 06/04/15 0901   06/02/15 0600  ampicillin-sulbactam (UNASYN) 1.5 g in sodium chloride 0.9 % 50 mL IVPB  Status:  Discontinued     1.5 g 100 mL/hr over 30 Minutes Intravenous Every 12 hours 06/01/15 1555 06/02/15 0613   06/01/15 1700  ampicillin-sulbactam (UNASYN) 1.5 g in sodium chloride 0.9 % 50 mL IVPB     1.5 g 100 mL/hr over 30 Minutes Intravenous  Once 06/01/15 1554 06/01/15 1726      Assessment/Plan: s/p Procedure(s): EXPLORATORY LAPAROTOMY OVERSEWING OF BLEEDING DUODENAL ULCER (N/A) Advance diet  Staples out Monday Keep JP until on solid diet  Can be OOB and participate in therapy when able   LOS: 12 days    [place] Creswell A. 06/13/2015

## 2015-06-13 NOTE — Progress Notes (Signed)
[facility] PROGRESS NOTE  Jasmine Newman MCN:[id number] DOB: 04-12-1931 DOA: 06/01/2015 PCP: No primary care provider on file.  Interim summary/brief H&P 82 year old with past mental history of hypothyroidism, hypertension, hyperlipidemia, depression. Admitted today to the ED after she had a fall at home. She is watching the [facility] basketball game with her husband. When she stood up to use the bathroom her legs gave out and she fell on the right side. She hurt her leg and chest. She did not hit her head and there was no loss of consciousness. She stayed on the floor the whole night. When her son arrived next morning she was found to be lying in a large pool of melanotic stool. In the ED she was found to be hypertensive with hemoglobin of 7.4 (baseline unknown). She continued to be hypotensive despite of 2 L of fluid. GI evaluated the patient. PCCM called for admission. Patient ended experiencing need for intubation and pressors; initially failed  IR approach for embolization and subsequent required surgery. Hemodynamically stable and with good O2 sat currently. Course complicated with enterococcus bacteremia and either NSTEMI vs demand ischemia. Needs TEE. ID, CCS and cardiology on board.  Assessment/Plan: 1 Respiratory failure with hypoxia: required intubation until 06/08/15 -Suspect secondary to multiple factors including pulmonary edema -Continues to have extensive bilateral crackles, a. Volume overload on physical examination. -Chest x-ray on 06/07/2015 had revealed interstitial and airspace opacities that could be consistent with edema. -Repeat chest x-ray on 06/11/2015 pending -On 06/11/2015 given ongoing evidence of volume overload including having extensive crackles on physical examination will increase Lasix to 40 mg IV twice a day -On my assessment on 06/12/2015 she appears improved with decrease in crackles. She had urine output of 6.4 L Kidney function stable having creatinine 1.13  with BUN of 20.Transition to Lasix 40 mg by mouth twice daily on this date. -On 06/13/2015 appears stable from respiratory standpoint. She'll be moved out of the stepdown unit to MedSurg  2 Shock: sepsis vs secondary to upper GI bleed -resolved -BP stable -She received aggressive IV fluid resuscitation initially -Showing overall clinical improvement. Required IV Lasix for volume overload -Looks much better by 06/13/2015  3 Aortic stenosis: s/p bioprosthetic valve -Transesophageal echocardiogram planned for 06/12/2015, having blood cultures growing enterococcus from 06/01/2015 and 06/04/2015 -keep good BP control  4 Suspect demand ischemia -She presented with septic shock, GI bleed requiring pressor support initially -2-D echo with wall motion abnormalities -cardiology consulted  -not on ASA due to GIB; not on b-blockers or ACE, given intermittent episodes of shock and renal failure  5.  Upper GI bleed secondary to large duodenal ulcer -She presented with melanotic stools, EGD performed on 06/02/2015 revealed a large 3 cm posterior wall duodenal ulcer that had a large adherent clot. Initially injected with epinephrine. She continued to have upper GI bleed, interventional radiology consulted undergoing coil embolization on 06/05/2015. -She re-bled for which general surgery was consulted and evaluated her on 06/06/2015. She was taken to the operating room on 06/06/2015 where she underwent exploratory laparotomy with oversewing of duodenal ulcer. -She has required blood transfusions during this hospitalization -Lab work on 06/11/2015 show a hemoglobin of 8.3 with hematocrit of 24.9 -continue PPI -On 06/12/2015 she had been tolerating clears for which her diet was further advanced to full liquids. Discussed case with general surgery. -On 06/13/2015 patient having hemoglobin of 8.2  6 Acute kidney injury: due to shock episodes and decrease perfusion -Labs on 06/12/2015 showing creatinine 1.13  with BUN  of 20.  7 Acute blood loss anemia -Secondary to upper GI bleed in setting of large duodenal ulcer -transfusion as needed; goal is for Hgb > 8 -follow Hgb and platelets trend -not active bleeding currently. On 06/13/2015 hemoglobin 8.2  8-Sepsis: secondary to enterococcus bacteremia -Present on admission  -Blood cultures obtained on 06/01/2015 and 06/04/2015 growing enterococcus species -Transesophageal echocardiogram had been planned for 06/12/2015, however, patient declined procedure. -continue ampicillin; ID on board -Dr Linus Salmons recommending prolonged course of ampicillin IV for 28 days total from 06/10/2015 if no transesophageal echocardiogram could be performed.  9 Left heel ulcer: present prior to admission -follow WOC recommendations -continue prevlon boots and preventive measurements   10 Modertae protein calorie malnutrition  -Remains TPN for now  11. Hypokalemia -A.m. labs showing potassium of 3.2, likely secondary to diuretic therapy. -Will provide Kdur 40 meq by mouth 2 -On 06/13/2015 lab work showed improvement in potassium at 3.8  Code Status: Full Family Communication: no family at bedside  Disposition Plan: remains in stepdown;   Consultants:  PCCM  CCS  ID  Cardiology (for TEE)  Procedures/studies: X ray rib 4/2: No fractures Port CXR 4/2: Questionable right basal hazy opacity. R IJ CVL in mid-SVC. Chronic elevation left hemidiaphragm.  R Knee 2View 4/2: No fracture or dislocation. Port CXR 4/4: Silhouetting of left hemidiaphragm unchanged. Endotracheal tube and central venous catheter in good position. No new focal opacity. TTE 4/4: LV normal in size with EF 45-50%. Akinesis of anterior septal myocardium. Grade 2 diastolic dysfunction. LA & RA normal in size. RV normal in size and function. PASP 67mHg. Moderate aortic stenosis w/ bioprosthetic aortic valve without regurgitation. Severe mitral vegetation without stenosis. Trivial pulmonic  regurgitation without stenosis. Moderate tricuspid regurgitation. Port CXR 4/8: Endotracheal tube and central line in good position. Low lung volumes. Hazy opacification bilaterally.  Antibiotics:  Ampicillin   HPI/Subjective: She continues to make progress, tolerating full liquids  Objective: Filed Vitals:   06/13/15 1100 06/13/15 1200  BP: 121/80 113/58  Pulse: 72 74  Temp: 97.7 F (36.5 C) 97.9 F (36.6 C)  Resp: 16 14    Intake/Output Summary (Last 24 hours) at 06/13/15 1657 Last data filed at 06/13/15 1525  Gross per 24 hour  Intake   2127 ml  Output   5195 ml  Net  -3068 ml   Filed Weights   06/11/15 0500 06/12/15 0700 06/13/15 0340  Weight: 107.8 kg (237 lb 10.5 oz) 104.2 kg (229 lb 11.5 oz) 104.8 kg (231 lb 0.7 oz)    Exam:   General:  Afebrile, complaining of soreness in her abd, no nausea, no vomiting. Tolerating full liquids  Cardiovascular: rate controlled currently, no rubs or gallops, positive JVD, positive SM  Respiratory: Improvement to crackles, no wheezing or rales  Abdomen: soft, positive BS, drain in place, mild tenderness with palpation; no guarding   Musculoskeletal: Improved edema and left heel ulcer appreciated; no cyanosis; SCD's in place  Data Reviewed: Basic Metabolic Panel:  Recent Labs Lab 06/09/15 0430 06/10/15 0135 06/11/15 0440 06/12/15 0330 06/13/15 0510  NA 149* 152* 151* 144 143  K 3.4* 3.7 3.1* 3.2* 3.8  CL 119* 121* 116* 105 101  CO2 24 23 30  33* 38*  GLUCOSE 111* 127* 135* 137* 148*  BUN 10 11 14 20  27*  CREATININE 1.37* 1.24* 1.22* 1.13* 1.16*  CALCIUM 7.8* 8.0* 8.1* 7.7* 7.9*  MG 2.1 2.1 2.2 1.8 2.0  PHOS 3.3 3.2 2.9 2.9 3.1   Liver  Function Tests:  Recent Labs Lab 06/09/15 0430 06/10/15 0135 06/11/15 0440 06/12/15 0330 06/13/15 0510  AST  --  23  --  34  --   ALT  --  16  --  21  --   ALKPHOS  --  65  --  86  --   BILITOT  --  0.8  --  0.6  --   PROT  --  5.4*  --  5.2*  --   ALBUMIN 2.1* 2.2*  1.9* 1.9* 1.7*   CBC:  Recent Labs Lab 06/09/15 2000 06/10/15 0135 06/11/15 0440 06/12/15 0330 06/13/15 0510  WBC 9.4 9.1 6.8 7.6 8.9  NEUTROABS 7.6 7.2 5.0 5.4 6.6  HGB 8.8* 9.0* 8.3* 8.8* 8.2*  HCT 26.0* 26.8* 24.9* 27.7* 25.9*  MCV 88.7 89.3 91.2 93.9 94.9  PLT 128* 133* 125* 131* 129*   Cardiac Enzymes: No results for input(s): CKTOTAL, CKMB, CKMBINDEX, TROPONINI in the last 168 hours. BNP (last 3 results)  Recent Labs  06/01/15 2100  BNP 329.7*   CBG:  Recent Labs Lab 06/12/15 0722 06/12/15 1538 06/13/15 0011 06/13/15 0732 06/13/15 1619  GLUCAP 129* 146* 122* 146* 126*    Recent Results (from the past 240 hour(s))  Culture, blood (routine x 2)     Status: None   Collection Time: 06/04/15 11:10 AM  Result Value Ref Range Status   Specimen Description BLOOD RIGHT HAND  Final   Special Requests IN PEDIATRIC BOTTLE 4CC  Final   Culture   Final    NO GROWTH 5 DAYS Performed at [facility]    Report Status 06/09/2015 FINAL  Final  Culture, blood (routine x 2)     Status: Abnormal   Collection Time: 06/04/15 11:11 AM  Result Value Ref Range Status   Specimen Description BLOOD LEFT HAND  Final   Special Requests IN PEDIATRIC BOTTLE 3CC  Final   Culture  Setup Time   Final    GRAM POSITIVE COCCI IN PAIRS IN CHAINS AEROBIC BOTTLE ONLY CRITICAL RESULT CALLED TO, READ BACK BY AND VERIFIED WITHBaldo Daub RN 1900 06/05/15 A BROWNING    Culture (A)  Final    ENTEROCOCCUS SPECIES SUSCEPTIBILITIES PERFORMED ON PREVIOUS CULTURE WITHIN THE LAST 5 DAYS. Performed at [facility]    Report Status 06/07/2015 FINAL  Final  Culture, blood (routine x 2)     Status: None (Preliminary result)   Collection Time: 06/10/15  1:30 AM  Result Value Ref Range Status   Specimen Description BLOOD BLOOD RIGHT WRIST  Final   Special Requests IN PEDIATRIC BOTTLE [place]  Final   Culture   Final    NO GROWTH 3 DAYS Performed at [facility]    Report Status  PENDING  Incomplete  Culture, blood (routine x 2)     Status: None (Preliminary result)   Collection Time: 06/10/15  1:35 AM  Result Value Ref Range Status   Specimen Description BLOOD RIGHT HAND  Final   Special Requests BOTTLES DRAWN AEROBIC AND ANAEROBIC 5CC  Final   Culture   Final    NO GROWTH 3 DAYS Performed at [facility]    Report Status PENDING  Incomplete     Studies: No results found.  Scheduled Meds: . ampicillin (OMNIPEN) IV  2 g Intravenous Q6H  . antiseptic oral rinse  7 mL Mouth Rinse QID  . chlorhexidine gluconate (SAGE KIT)  15 mL Mouth Rinse BID  . furosemide  40  mg Oral BID  . insulin aspart  0-9 Units Subcutaneous Q8H  . levothyroxine  50 mcg Oral QAC breakfast  . pantoprazole (PROTONIX) IV  40 mg Intravenous Q12H  . sodium chloride flush  10-40 mL Intracatheter Q12H   Continuous Infusions: . Marland KitchenTPN (CLINIMIX-E) Adult 83 mL/hr at 06/12/15 1714  . Marland KitchenTPN (CLINIMIX-E) Adult    . dextrose 10 mL/hr at 06/11/15 1801    Active Problems:   GI bleed   Pressure ulcer   Gastrointestinal hemorrhage associated with duodenal ulcer   Hypovolemic shock (HCC)   Bacteremia   NSTEMI (non-ST elevated myocardial infarction) ([place])   Malnutrition of moderate degree   Time spent: 25 minutes (> 50% dedicated to face to face examination, coordination of care and intercommunication with consultants)   Kelvin Cellar  [facility] Pager [phone]. If 7PM-7AM, please contact night-coverage at [email or web address], password [facility] 06/13/2015, 4:57 PM  LOS: 12 days

## 2015-06-13 NOTE — Progress Notes (Signed)
PARENTERAL NUTRITION CONSULT NOTE  Pharmacy Consult for TPN Indication: prolonged NPO, s/p duodenal ulcer oversewing  No Known Allergies  Patient Measurements: Height:  (165.1 cm) Weight: 231 lb 0.7 oz (104.8 kg) IBW/kg (Calculated) : 57 BMI 40  Vital Signs: Temp: 97.7 F (36.5 C) (04/14 0600) Temp Source: Core (Comment) (04/14 0600) BP: 85/51 mmHg (04/14 0600) Pulse Rate: 67 (04/14 0600) Intake/Output from previous day: 04/13 0701 - 04/14 0700 In: 4958.2 [P.O.:240; I.V.:1402.3; IV Piggyback:200; TPN:3115.9] Out: 5640 [Urine:5400; Drains:240] Intake/Output from this shift:    Labs:  Recent Labs  06/11/15 0440 06/12/15 0330 06/13/15 0510  WBC 6.8 7.6 8.9  HGB 8.3* 8.8* 8.2*  HCT 24.9* 27.7* 25.9*  PLT 125* 131* 129*  APTT 35 30 36  INR 1.08 0.95 1.03     Recent Labs  06/11/15 0440 06/12/15 0330 06/13/15 0510  NA 151* 144 143  K 3.1* 3.2* 3.8  CL 116* 105 101  CO2 30 33* 38*  GLUCOSE 135* 137* 148*  BUN 14 20 27*  CREATININE 1.22* 1.13* 1.16*  CALCIUM 8.1* 7.7* 7.9*  MG 2.2 1.8 2.0  PHOS 2.9 2.9 3.1  PROT  --  5.2*  --   ALBUMIN 1.9* 1.9* 1.7*  AST  --  34  --   ALT  --  21  --   ALKPHOS  --  86  --   BILITOT  --  0.6  --    Estimated Creatinine Clearance: 45.7 mL/min (by C-G formula based on Cr of 1.16).    Recent Labs  06/12/15 1538 06/13/15 0011 06/13/15 0732  GLUCAP 146* 122* 146*    Medical History: Past Medical History  Diagnosis Date  . Hypertension     Medications:  Infusions:  . Marland KitchenTPN (CLINIMIX-E) Adult 83 mL/hr at 06/12/15 1714  . dextrose 10 mL/hr at 06/11/15 1801    Insulin Requirements in the past 24 hours: 4 units SSI  Current Nutrition: Full liquid diet - advanced to soft diet 4/14  IVF: D5W @ KVO  Central access: Rt IJ CVL (4/2) TPN start date: 4/10  ASSESSMENT                                                                                                          HPI: 48 yoF admitted on 4/2 with GIB  and hypotension; s/p failed arterial embolization 4/6, requiring surgical oversewing of bleeding duodenal ulcer 06/06/15. She remains NPO with malnutrition, unable to meet needs, and is high risk for refeeding.  Surgery may allow PO intake 5 days postop.  Note significant weight increase from 86 kg on admission to 109 kg, I/O unreliable when stool volume is unmeasured.  Pharmacy has been consulted to dose TPN.  Significant events:  4/10 PM - 4/11 AM Overnight, nursing notes indicate 2 dark bloody BM 4/11 Concern for fluid overload (wt increase of 23 kg since admission, UOP yesterday averaged 0.45 ml/kg/hr).  Lasix added, CXR pending, IVF adjusted for TPN volume. 4/12 Remains fluid overload but had excellent UOP yesterday (1.25 ml/kg/hr, I/O -  3.25L) and lasix dose increased today. 4/13 I/O -4.4L yesterday.  She has tolerated CLD and is advanced to full liquids today. 4/14: I/O -700mL last 24hr. Per GI, tolerated FL and to advance diet to soft today  Today:   Glucose: meeting goal < 150 with minimal SSI.  CBGs: 114-146  Electrolytes: WNL  Renal:  SCr 1.16, stable  LFTs: WNL (4/13)  TGs:  134  Prealbumin: 11.5  NUTRITIONAL GOALS                                                                                             RD recs (4/10): 1850-2035 kcal/day, 110-120 g protein/day Recommendations based on admission weight which is still ~ 150% of ideal weight.  If pt shows signs of overfeeding, consider recalculating using adjusted weight, sinceTBW > 125% IBW.  Admission weight 86kg, adjusted weight 64 kg.  Current weight 104 kg, adjusted weight 69 kg.  Based on published guidelines, while the patient meets ICU status the initiation of lipids is being delayed for 7 days or until transition out of ICU. Planned start date for lipids is 4/17.  Goal will be to meet 100% of the patient's protein needs and approximately 80% of their caloric needs.  Clinimix 5/20 @ 83 mL/hr to provide 100 grams  protein (91% of minimum goal) and 1753 kcal (95% of minimum goal)    PLAN                                                                                                                         At 1800 today:  Due to advancing diet will start decreasing TPN rate - continue Clinimix E 5/20 but will reduce rate to 50 ml/hr  HOLD lipids (may start on 4/17 or when no longer ICU status)   TPN to contain standard multivitamins and trace elements.  [facility]  Continue sensitive SSI and CBGs q8h  TPN lab panels on Mondays & Thursdays.  F/u daily; f/u PO intake and if tolerating PO diet.   Hessie KnowsJustin M Eniya Cannady, PharmD, BCPS Pager [phone] 06/13/2015 8:24 AM

## 2015-06-14 LAB — PROTIME-INR
INR: 0.91 (ref 0.00–1.49)
Prothrombin Time: 12.5 seconds (ref 11.6–15.2)

## 2015-06-14 LAB — RENAL FUNCTION PANEL
ALBUMIN: 1.7 g/dL — AB (ref 3.5–5.0)
ANION GAP: 8 (ref 5–15)
BUN: 31 mg/dL — ABNORMAL HIGH (ref 6–20)
CHLORIDE: 98 mmol/L — AB (ref 101–111)
CO2: 37 mmol/L — AB (ref 22–32)
Calcium: 8.1 mg/dL — ABNORMAL LOW (ref 8.9–10.3)
Creatinine, Ser: 1.34 mg/dL — ABNORMAL HIGH (ref 0.44–1.00)
GFR calc Af Amer: 42 mL/min — ABNORMAL LOW (ref >60)
GFR calc non Af Amer: 36 mL/min — ABNORMAL LOW (ref >60)
GLUCOSE: 125 mg/dL — AB (ref 65–99)
PHOSPHORUS: 3.4 mg/dL (ref 2.5–4.6)
POTASSIUM: 3.7 mmol/L (ref 3.5–5.1)
Sodium: 143 mmol/L (ref 135–145)

## 2015-06-14 LAB — CBC WITH DIFFERENTIAL/PLATELET
BASOS ABS: 0 10*3/uL (ref 0.0–0.1)
BASOS PCT: 0 %
EOS ABS: 0.3 10*3/uL (ref 0.0–0.7)
Eosinophils Relative: 4 %
HEMATOCRIT: 26 % — AB (ref 36.0–46.0)
HEMOGLOBIN: 8 g/dL — AB (ref 12.0–15.0)
Lymphocytes Relative: 13 %
Lymphs Abs: 1.1 10*3/uL (ref 0.7–4.0)
MCH: 29.3 pg (ref 26.0–34.0)
MCHC: 30.8 g/dL (ref 30.0–36.0)
MCV: 95.2 fL (ref 78.0–100.0)
MONOS PCT: 11 %
Monocytes Absolute: 0.9 10*3/uL (ref 0.1–1.0)
NEUTROS PCT: 72 %
Neutro Abs: 6.1 10*3/uL (ref 1.7–7.7)
Platelets: 148 10*3/uL — ABNORMAL LOW (ref 150–400)
RBC: 2.73 MIL/uL — ABNORMAL LOW (ref 3.87–5.11)
RDW: 18.7 % — ABNORMAL HIGH (ref 11.5–15.5)
WBC: 8.4 10*3/uL (ref 4.0–10.5)

## 2015-06-14 LAB — GLUCOSE, CAPILLARY
GLUCOSE-CAPILLARY: 128 mg/dL — AB (ref 65–99)
Glucose-Capillary: 111 mg/dL — ABNORMAL HIGH (ref 65–99)
Glucose-Capillary: 141 mg/dL — ABNORMAL HIGH (ref 65–99)

## 2015-06-14 LAB — MAGNESIUM: Magnesium: 1.8 mg/dL (ref 1.7–2.4)

## 2015-06-14 LAB — APTT: APTT: 37 s (ref 24–37)

## 2015-06-14 LAB — FIBRINOGEN: FIBRINOGEN: 536 mg/dL — AB (ref 204–475)

## 2015-06-14 MED ORDER — TRACE MINERALS CR-CU-MN-SE-ZN 10-1000-500-60 MCG/ML IV SOLN
INTRAVENOUS | Status: DC
  Administered 2015-06-14: 17:00:00 via INTRAVENOUS
  Filled 2015-06-14: qty 960

## 2015-06-14 MED ORDER — FUROSEMIDE 40 MG PO TABS
40.0000 mg | ORAL_TABLET | Freq: Every day | ORAL | Status: DC
  Administered 2015-06-15 – 2015-06-16 (×2): 40 mg via ORAL
  Filled 2015-06-14 (×2): qty 1

## 2015-06-14 MED ORDER — TRAMADOL HCL 50 MG PO TABS
50.0000 mg | ORAL_TABLET | Freq: Four times a day (QID) | ORAL | Status: DC | PRN
  Administered 2015-06-14 – 2015-06-18 (×6): 50 mg via ORAL
  Filled 2015-06-14 (×6): qty 1

## 2015-06-14 NOTE — Progress Notes (Addendum)
Pt left heel dressing changed. Pt premedicated, tolerated well.  Pt resting with call light in reach, will continue to monitor.

## 2015-06-14 NOTE — Evaluation (Signed)
Physical Therapy Evaluation Patient Details Name: Jasmine Newman MRN: [id number] DOB: 1931-03-24 Today's Date: 06/14/2015   History of Present Illness  82 year old with past mental history of hypothyroidism, hypertension, hyperlipidemia, depression. Admitted today to the ED after she had a fall at home. When she stood up to use the bathroom her legs gave out and she fell on the right side. She hurt her leg and chest. She did not hit her head and there was no loss of consciousness. She on the floor the whole night. When her son arrived next morning she was found to be lying in a large pool of melanotic stool. In the ED she was found to be hypertensive with hemoglobin of 7.4 (baseline unknown).   Clinical Impression  Pt admitted with above diagnosis. Pt currently with functional limitations due to the deficits listed below (see PT Problem List).  Pt will benefit from skilled PT to increase their independence and safety with mobility to allow discharge to the venue listed below.  Pt was non-ambulatory at home, but was able to normally transfer self to [facility] with RW.  Pt was TOT A with rolling and groaning with any movement of legs. Recommend air mattress and SNF.     Follow Up Recommendations SNF    Equipment Recommendations  None recommended by PT    Recommendations for Other Services       Precautions / Restrictions Precautions Precautions: Fall      Mobility  Bed Mobility Overal bed mobility: Needs Assistance Bed Mobility: Rolling Rolling: Total assist         General bed mobility comments: Pt attempted to reach over with L arm to roll to the R, but needed TOT A to roll to the R.  Pt was not agreeable to even attempt to roll L.  Transfers                    Ambulation/Gait                Stairs            Wheelchair Mobility    Modified Rankin (Stroke Patients Only)       Balance Overall balance assessment: History of Falls                                            Pertinent Vitals/Pain Pain Assessment: Faces Faces Pain Scale: Hurts whole lot Pain Location: overall body pain  Pain Descriptors / Indicators: Grimacing Pain Intervention(s): Monitored during session;Limited activity within patient's tolerance    Home Living Family/patient expects to be discharged to:: Private residence Living Arrangements: Spouse/significant other Available Help at Discharge: Family Type of Home: House Home Access: Ramped entrance     Home Layout: One level Home Equipment: Environmental consultant - 2 wheels;Bedside commode;Wheelchair - manual;Hospital bed Additional Comments: son lives next door and is around in the evenings    Prior Function Level of Independence: Needs assistance   Gait / Transfers Assistance Needed: non-ambulatory, transfers from hospital bed to [facility] with RW           Hand Dominance        Extremity/Trunk Assessment   Upper Extremity Assessment: Generalized weakness (L arm swollen)           Lower Extremity Assessment: Generalized weakness;RLE deficits/detail;LLE deficits/detail         Communication  Communication: HOH  Cognition Arousal/Alertness: Awake/alert Behavior During Therapy: WFL for tasks assessed/performed Overall Cognitive Status: No family/caregiver present to determine baseline cognitive functioning                      General Comments General comments (skin integrity, edema, etc.): Pt with L heel protector boot in tact and had to re-position with pt moaning in pain when re-positioned.      Exercises        Assessment/Plan    PT Assessment Patient needs continued PT services  PT Diagnosis Acute pain;Generalized weakness   PT Problem List Decreased activity tolerance;Decreased strength;Decreased mobility  PT Treatment Interventions Functional mobility training;Therapeutic activities;Therapeutic exercise;Balance training;Patient/family education   PT Goals  (Current goals can be found in the Care Plan section) Acute Rehab PT Goals Patient Stated Goal: none stated PT Goal Formulation: With patient Time For Goal Achievement: 06/28/15 Potential to Achieve Goals: Good    Frequency Min 2X/week   Barriers to discharge        Co-evaluation               End of Session Equipment Utilized During Treatment: Oxygen Activity Tolerance: Patient limited by pain;Patient limited by fatigue Patient left: in bed Nurse Communication: Mobility status;Need for lift equipment         Time: [id number] PT Time Calculation (min) (ACUTE ONLY): 13 min   Charges:   PT Evaluation $PT Eval Moderate Complexity: 1 Procedure     PT G Codes:        Zakai Gonyea LUBECK 06/14/2015, 10:26 AM

## 2015-06-14 NOTE — Progress Notes (Signed)
Pt placed on air over lay mattress. Pt resting without c/o at this time. Call light in reach will continue to monitor.

## 2015-06-14 NOTE — Progress Notes (Signed)
PARENTERAL NUTRITION CONSULT NOTE  Pharmacy Consult for TPN Indication: prolonged NPO, s/p duodenal ulcer oversewing  No Known Allergies  Patient Measurements: Height: 5\' 5"  (165.1 cm) Weight: 231 lb 0.7 oz (104.8 kg) IBW/kg (Calculated) : 57 BMI 40  Vital Signs: Temp: 98.7 F (37.1 C) (04/15 0611) Temp Source: Oral (04/15 0611) BP: 99/45 mmHg (04/15 [id number]) Pulse Rate: 78 (04/15 0611) Intake/Output from previous day: 04/14 0701 - 04/15 0700 In: 935 [P.O.:180; I.V.:290; IV Piggyback:50; TPN:415] Out: 5350 [Urine:5175; Drains:175] Intake/Output from this shift:    Labs:  Recent Labs  06/12/15 0330 06/13/15 0510 06/14/15 0445  WBC 7.6 8.9 8.4  HGB 8.8* 8.2* 8.0*  HCT 27.7* 25.9* 26.0*  PLT 131* 129* 148*  APTT 30 36 37  INR 0.95 1.03 0.91     Recent Labs  06/12/15 0330 06/13/15 0510 06/14/15 0445  NA 144 143 143  K 3.2* 3.8 3.7  CL 105 101 98*  CO2 33* 38* 37*  GLUCOSE 137* 148* 125*  BUN 20 27* 31*  CREATININE 1.13* 1.16* 1.34*  CALCIUM 7.7* 7.9* 8.1*  MG 1.8 2.0 1.8  PHOS 2.9 3.1 3.4  PROT 5.2*  --   --   ALBUMIN 1.9* 1.7* 1.7*  AST 34  --   --   ALT 21  --   --   ALKPHOS 86  --   --   BILITOT 0.6  --   --    Estimated Creatinine Clearance: 39.6 mL/min (by C-G formula based on Cr of 1.34).    Recent Labs  06/13/15 1619 06/13/15 2327 06/14/15 0737  GLUCAP 126* 112* 128*    Medical History: Past Medical History  Diagnosis Date  . Hypertension     Medications:  Infusions:  . Marland Kitchen.TPN (CLINIMIX-E) Adult 50 mL/hr at 06/13/15 1736  . dextrose 10 mL/hr at 06/11/15 1801    Insulin Requirements in the past 24 hours: 4 units SSI  Current Nutrition: Full liquid diet - advanced to soft diet 4/14  IVF: D5W @ KVO  Central access: Rt IJ CVL (4/2) TPN start date: 4/10  ASSESSMENT                                                                                                          HPI: [id number] yoF admitted on 4/2 with GIB and hypotension; s/p  failed arterial embolization 4/6, requiring surgical oversewing of bleeding duodenal ulcer 06/06/15. She remains NPO with malnutrition, unable to meet needs, and is high risk for refeeding.  Surgery may allow PO intake 5 days postop.  Note significant weight increase from 86 kg on admission to 109 kg, I/O unreliable when stool volume is unmeasured.  Pharmacy has been consulted to dose TPN.  Significant events:  4/10 PM - 4/11 AM Overnight, nursing notes indicate 2 dark bloody BM 4/11 Concern for fluid overload (wt increase of 23 kg since admission, UOP yesterday averaged 0.45 ml/kg/hr).  Lasix added, CXR pending, IVF adjusted for TPN volume. 4/12 Remains fluid overload but had excellent UOP yesterday (1.25 ml/kg/hr, I/O - 3.25L)  and lasix dose increased today. 4/13 I/O -4.4L yesterday.  She has tolerated CLD and is advanced to full liquids today. 4/14: I/O - last 24hr. Per GI, tolerated FL and to advance diet to soft today 4/15: I/O -4.4L. Per GI, tolerating diet and to change to regular diet today.  Today:   Glucose: meeting goal < 150 with minimal SSI.  CBGs: 114-146  Electrolytes: WNL  Renal:  SCr 1.34, rising   LFTs: WNL (4/13)  TGs:  134  Prealbumin: 11.5  NUTRITIONAL GOALS                                                                                             RD recs (4/10): 1850-2035 kcal/day, 110-120 g protein/day Recommendations based on admission weight which is still ~ 150% of ideal weight.  If pt shows signs of overfeeding, consider recalculating using adjusted weight, sinceTBW > 125% IBW.  Admission weight 86kg, adjusted weight 64 kg.  Current weight 104 kg, adjusted weight 69 kg.  Based on published guidelines, while the patient meets ICU status the initiation of lipids is being delayed for 7 days or until transition out of ICU. Planned start date for lipids is 4/17.  Goal will be to meet 100% of the patient's protein needs and approximately 80% of their caloric  needs.  Clinimix 5/20 @ 83 mL/hr to provide 100 grams protein (91% of minimum goal) and 1753 kcal (95% of minimum goal)    PLAN                                                                                                                         At 1800 today:  Due to advancing diet will start decreasing TPN rate - continue Clinimix E 5/20 but will reduce rate to 40 ml/hr. Await orders to stop TPN maybe tomorrow?  HOLD lipids since likely to stop TPN soon  TPN to contain standard multivitamins and trace elements.  Continue IVF at [facility]  Continue sensitive SSI and CBGs q8h  TPN lab panels on Mondays & Thursdays.  F/u daily; f/u PO intake and if tolerating PO diet.   Hessie Knows, PharmD, BCPS Pager [phone] 06/14/2015 9:37 AM

## 2015-06-14 NOTE — Progress Notes (Signed)
Patient ID: Jasmine Newman, female   DOB: 03-31-1931, 82 y.o.   MRN: [id number]  General Surgery [facility]  Subjective: POD#8 - patient on med-surg floor now.  Cannot get OOB - PT evaluating.  Tolerating diet.  No complaints this AM.  Objective: Vital signs in last 24 hours: Temp:  [97.7 F (36.5 C)-98.7 F (37.1 C)] 98.7 F (37.1 C) (04/15 0611) Pulse Rate:  [68-82] 78 (04/15 0611) Resp:  [14-16] 16 (04/15 0611) BP: (99-121)/(45-80) 99/45 mmHg (04/15 0611) SpO2:  [97 %-100 %] 97 % (04/15 0611) Last BM Date: 06/13/15  Intake/Output from previous day: 04/14 0701 - 04/15 0700 In: 935 [P.O.:180; I.V.:290; IV Piggyback:50; TPN:415] Out: 5350 [Urine:5175; Drains:175] Intake/Output this shift:    Physical Exam: HEENT - sclerae clear, mucous membranes moist Abdomen - soft, few BS present; non-tender; JP with serous output; midline wound dry and intact with staples Ext - no edema, non-tender Neuro - alert & oriented, no focal deficits  Lab Results:   Recent Labs  06/13/15 0510 06/14/15 0445  WBC 8.9 8.4  HGB 8.2* 8.0*  HCT 25.9* 26.0*  PLT 129* 148*   BMET  Recent Labs  06/13/15 0510 06/14/15 0445  NA 143 143  K 3.8 3.7  CL 101 98*  CO2 38* 37*  GLUCOSE 148* 125*  BUN 27* 31*  CREATININE 1.16* 1.34*  CALCIUM 7.9* 8.1*   PT/INR  Recent Labs  06/13/15 0510 06/14/15 0445  LABPROT 13.7 12.5  INR 1.03 0.91   Comprehensive Metabolic Panel:    Component Value Date/Time   NA 143 06/14/2015 0445   NA 143 06/13/2015 0510   K 3.7 06/14/2015 0445   K 3.8 06/13/2015 0510   CL 98* 06/14/2015 0445   CL 101 06/13/2015 0510   CO2 37* 06/14/2015 0445   CO2 38* 06/13/2015 0510   BUN 31* 06/14/2015 0445   BUN 27* 06/13/2015 0510   CREATININE 1.34* 06/14/2015 0445   CREATININE 1.16* 06/13/2015 0510   CREATININE 1.14* 06/28/2011 1328   GLUCOSE 125* 06/14/2015 0445   GLUCOSE 148* 06/13/2015 0510   CALCIUM 8.1* 06/14/2015 0445   CALCIUM  7.9* 06/13/2015 0510   AST 34 06/12/2015 0330   AST 23 06/10/2015 0135   ALT 21 06/12/2015 0330   ALT 16 06/10/2015 0135   ALKPHOS 86 06/12/2015 0330   ALKPHOS 65 06/10/2015 0135   BILITOT 0.6 06/12/2015 0330   BILITOT 0.8 06/10/2015 0135   PROT 5.2* 06/12/2015 0330   PROT 5.4* 06/10/2015 0135   ALBUMIN 1.7* 06/14/2015 0445   ALBUMIN 1.7* 06/13/2015 0510    Studies/Results: No results found.  Assessment & Plans: EX LAP with OVERSEWING OF BLEEDING DUODENAL ULCER - 06/07/2015 by Dr. Avel Peaceodd Rosenbower  Regular diet  Hgb stable  Staples and JP out on Monday 4/17  Discussed with PT - patient cannot even roll over in bed; will need rehab/SNF  Will see Monday for wound check and drain removal  Velora Hecklerodd M. Donnis Phaneuf, MD, [facility] Office: [phone]   [name] 06/14/2015

## 2015-06-14 NOTE — Progress Notes (Signed)
[facility] PROGRESS NOTE  Dejia Ebron Batty FGH:[id number] DOB: 1931-12-28 DOA: 06/01/2015 PCP: No primary care provider on file.  Interim summary/brief H&P 82 year old with past mental history of hypothyroidism, hypertension, hyperlipidemia, depression. Admitted today to the ED after she had a fall at home. She is watching the [employer] basketball game with her husband. When she stood up to use the bathroom her legs gave out and she fell on the right side. She hurt her leg and chest. She did not hit her head and there was no loss of consciousness. She stayed on the floor the whole night. When her son arrived next morning she was found to be lying in a large pool of melanotic stool. In the ED she was found to be hypertensive with hemoglobin of 7.4 (baseline unknown). She continued to be hypotensive despite of 2 L of fluid. GI evaluated the patient. PCCM called for admission. Patient ended experiencing need for intubation and pressors; initially failed  IR approach for embolization and subsequent required surgery. Hemodynamically stable and with good O2 sat currently. Course complicated with enterococcus bacteremia and either NSTEMI vs demand ischemia. Needs TEE. ID, CCS and cardiology on board.  She was seen and evaluated by physical therapy who recommended skilled nursing facility placement.  Assessment/Plan: 1 Respiratory failure with hypoxia: required intubation until 06/08/15 -Suspect secondary to multiple factors including pulmonary edema -Continues to have extensive bilateral crackles, a. Volume overload on physical examination. -Chest x-ray on 06/07/2015 had revealed interstitial and airspace opacities that could be consistent with edema. -Repeat chest x-ray on 06/11/2015 pending -On 06/11/2015 given ongoing evidence of volume overload including having extensive crackles on physical examination will increase Lasix to 40 mg IV twice a day -On my assessment on 06/12/2015 she appears improved  with decrease in crackles. She had urine output of 6.4 L Kidney function stable having creatinine 1.13 with BUN of 20.Transition to Lasix 40 mg by mouth twice daily on this date.  2 Shock: sepsis vs secondary to upper GI bleed -resolved -BP stable -She received aggressive IV fluid resuscitation initially -Showing overall clinical improvement. Required IV Lasix for volume overload -Looks much better by 06/13/2015 -Plan to continue IV ampicillin as recommended by infectious disease. ID had recommended ampicillin for 28 days total from 06/10/2015 if no transesophageal echocardiogram would be done.  3 Aortic stenosis: s/p bioprosthetic valve -Transesophageal echocardiogram planned for 06/12/2015, having blood cultures growing enterococcus from 06/01/2015 and 06/04/2015 -keep good BP control  4 Suspect demand ischemia -She presented with septic shock, GI bleed requiring pressor support initially -2-D echo with wall motion abnormalities -cardiology consulted  -not on ASA due to GIB; not on b-blockers or ACE, given intermittent episodes of shock and renal failure  5.  Upper GI bleed secondary to large duodenal ulcer -She presented with melanotic stools, EGD performed on 06/02/2015 revealed a large 3 cm posterior wall duodenal ulcer that had a large adherent clot. Initially injected with epinephrine. She continued to have upper GI bleed, interventional radiology consulted undergoing coil embolization on 06/05/2015. -She re-bled for which general surgery was consulted and evaluated her on 06/06/2015. She was taken to the operating room on 06/06/2015 where she underwent exploratory laparotomy with oversewing of duodenal ulcer. -She has required blood transfusions during this hospitalization -Lab work on 06/11/2015 show a hemoglobin of 8.3 with hematocrit of 24.9 -continue PPI -On 06/12/2015 she had been tolerating clears for which her diet was further advanced to full liquids. Discussed case with  general surgery. -On 06/13/2015  patient having hemoglobin of 8.2  6 Acute kidney injury: due to shock episodes and decrease perfusion -On 06/14/2015 labs showing creatinine of 1.34. -Having an upward trend in her creatinine will decrease her Lasix dose to 40 mg by mouth daily (previously and 40 mg by mouth twice a day) -Repeat BMP in a.m.  7 Acute blood loss anemia -Secondary to upper GI bleed in setting of large duodenal ulcer -transfusion as needed; goal is for Hgb > 8 -follow Hgb and platelets trend -not active bleeding currently. On 06/13/2015 hemoglobin 8.2 -On 06/14/2015 lab work showing hemoglobin of 8.0 -Continue monitoring, consider blood transfusion of her hemoglobin continues to trend down.  8-Sepsis: secondary to enterococcus bacteremia -Present on admission  -Blood cultures obtained on 06/01/2015 and 06/04/2015 growing enterococcus species -Transesophageal echocardiogram had been planned for 06/12/2015, however, patient declined procedure. -continue ampicillin; ID on board -Dr Linus Salmons recommending prolonged course of ampicillin IV for 28 days total from 06/10/2015 if no transesophageal echocardiogram could be performed.  9 Left heel ulcer: present prior to admission -follow WOC recommendations -continue prevlon boots and preventive measurements   10 Modertae protein calorie malnutrition  -Remains TPN for now  11. Hypokalemia -A.m. labs showing potassium of 3.2, likely secondary to diuretic therapy. -Will provide Kdur 40 meq by mouth 2 -On 06/13/2015 lab work showed improvement in potassium at 3.8  Code Status: Full Family Communication: no family at bedside  Disposition Plan: remains in stepdown;   Consultants:  PCCM  CCS  ID  Cardiology (for TEE)  Procedures/studies: X ray rib 4/2: No fractures Port CXR 4/2: Questionable right basal hazy opacity. R IJ CVL in mid-SVC. Chronic elevation left hemidiaphragm.  R Knee 2View 4/2: No fracture or  dislocation. Port CXR 4/4: Silhouetting of left hemidiaphragm unchanged. Endotracheal tube and central venous catheter in good position. No new focal opacity. TTE 4/4: LV normal in size with EF 45-50%. Akinesis of anterior septal myocardium. Grade 2 diastolic dysfunction. LA & RA normal in size. RV normal in size and function. PASP 54mHg. Moderate aortic stenosis w/ bioprosthetic aortic valve without regurgitation. Severe mitral vegetation without stenosis. Trivial pulmonic regurgitation without stenosis. Moderate tricuspid regurgitation. Port CXR 4/8: Endotracheal tube and central line in good position. Low lung volumes. Hazy opacification bilaterally.  Antibiotics:  Ampicillin   HPI/Subjective: She continues to make progress, looks better overall  Objective: Filed Vitals:   06/14/15 1200 06/14/15 1424  BP: 100/50 101/48  Pulse:  82  Temp:  98 F (36.7 C)  Resp:  18    Intake/Output Summary (Last 24 hours) at 06/14/15 1731 Last data filed at 06/14/15 1250  Gross per 24 hour  Intake    350 ml  Output   4635 ml  Net  -4285 ml   Filed Weights   06/12/15 0700 06/13/15 0340 06/14/15 1000  Weight: 104.2 kg (229 lb 11.5 oz) 104.8 kg (231 lb 0.7 oz) 105.2 kg (231 lb 14.8 oz)    Exam:   General:  Afebrile, complaining of soreness in her abd, no nausea, no vomiting. Tolerating full liquids  Cardiovascular: rate controlled currently, no rubs or gallops, positive JVD, positive SM  Respiratory: Improvement to crackles, no wheezing or rales  Abdomen: soft, positive BS, drain in place, mild tenderness with palpation; no guarding   Musculoskeletal: Improved edema and left heel ulcer appreciated; no cyanosis; SCD's in place  Data Reviewed: Basic Metabolic Panel:  Recent Labs Lab 06/10/15 0135 06/11/15 0440 06/12/15 0330 06/13/15 0510 06/14/15 0445  NA  152* 151* 144 143 143  K 3.7 3.1* 3.2* 3.8 3.7  CL 121* 116* 105 101 98*  CO2 23 30 33* 38* 37*  GLUCOSE 127* 135* 137*  148* 125*  BUN 11 14 20  27* 31*  CREATININE 1.24* 1.22* 1.13* 1.16* 1.34*  CALCIUM 8.0* 8.1* 7.7* 7.9* 8.1*  MG 2.1 2.2 1.8 2.0 1.8  PHOS 3.2 2.9 2.9 3.1 3.4   Liver Function Tests:  Recent Labs Lab 06/10/15 0135 06/11/15 0440 06/12/15 0330 06/13/15 0510 06/14/15 0445  AST 23  --  34  --   --   ALT 16  --  21  --   --   ALKPHOS 65  --  86  --   --   BILITOT 0.8  --  0.6  --   --   PROT 5.4*  --  5.2*  --   --   ALBUMIN 2.2* 1.9* 1.9* 1.7* 1.7*   CBC:  Recent Labs Lab 06/10/15 0135 06/11/15 0440 06/12/15 0330 06/13/15 0510 06/14/15 0445  WBC 9.1 6.8 7.6 8.9 8.4  NEUTROABS 7.2 5.0 5.4 6.6 6.1  HGB 9.0* 8.3* 8.8* 8.2* 8.0*  HCT 26.8* 24.9* 27.7* 25.9* 26.0*  MCV 89.3 91.2 93.9 94.9 95.2  PLT 133* 125* 131* 129* 148*   Cardiac Enzymes: No results for input(s): CKTOTAL, CKMB, CKMBINDEX, TROPONINI in the last 168 hours. BNP (last 3 results)  Recent Labs  06/01/15 2100  BNP 329.7*   CBG:  Recent Labs Lab 06/13/15 0732 06/13/15 1619 06/13/15 2327 06/14/15 0737 06/14/15 1522  GLUCAP 146* 126* 112* 128* 111*    Recent Results (from the past 240 hour(s))  Culture, blood (routine x 2)     Status: None (Preliminary result)   Collection Time: 06/10/15  1:30 AM  Result Value Ref Range Status   Specimen Description BLOOD BLOOD RIGHT WRIST  Final   Special Requests IN PEDIATRIC BOTTLE [place]  Final   Culture   Final    NO GROWTH 3 DAYS Performed at [facility]    Report Status PENDING  Incomplete  Culture, blood (routine x 2)     Status: None (Preliminary result)   Collection Time: 06/10/15  1:35 AM  Result Value Ref Range Status   Specimen Description BLOOD RIGHT HAND  Final   Special Requests BOTTLES DRAWN AEROBIC AND ANAEROBIC 5CC  Final   Culture   Final    NO GROWTH 3 DAYS Performed at [facility]    Report Status PENDING  Incomplete     Studies: No results found.  Scheduled Meds: . ampicillin (OMNIPEN) IV  2 g Intravenous Q6H   . antiseptic oral rinse  7 mL Mouth Rinse QID  . chlorhexidine gluconate (SAGE KIT)  15 mL Mouth Rinse BID  . furosemide  40 mg Oral BID  . insulin aspart  0-9 Units Subcutaneous Q8H  . levothyroxine  50 mcg Oral QAC breakfast  . pantoprazole (PROTONIX) IV  40 mg Intravenous Q12H  . sodium chloride flush  10-40 mL Intracatheter Q12H   Continuous Infusions: . Marland KitchenTPN (CLINIMIX-E) Adult Stopped (06/14/15 1700)  . Marland KitchenTPN (CLINIMIX-E) Adult 40 mL/hr at 06/14/15 1702  . dextrose 10 mL/hr at 06/11/15 1801    Active Problems:   GI bleed   Pressure ulcer   Gastrointestinal hemorrhage associated with duodenal ulcer   Hypovolemic shock (HCC)   Bacteremia   NSTEMI (non-ST elevated myocardial infarction) (HCC)   Malnutrition of moderate degree   Anemia due to acute  blood loss   Time spent: 15 minutes (> 50% dedicated to face to face examination, coordination of care and intercommunication with consultants)   Kelvin Cellar  [facility] Pager [phone]. If 7PM-7AM, please contact night-coverage at [email or web address], password [facility] 06/14/2015, 5:31 PM  LOS: 13 days

## 2015-06-15 LAB — RENAL FUNCTION PANEL
ANION GAP: 8 (ref 5–15)
Albumin: 1.7 g/dL — ABNORMAL LOW (ref 3.5–5.0)
BUN: 28 mg/dL — AB (ref 6–20)
CHLORIDE: 96 mmol/L — AB (ref 101–111)
CO2: 38 mmol/L — AB (ref 22–32)
Calcium: 8.1 mg/dL — ABNORMAL LOW (ref 8.9–10.3)
Creatinine, Ser: 1.37 mg/dL — ABNORMAL HIGH (ref 0.44–1.00)
GFR calc Af Amer: 41 mL/min — ABNORMAL LOW (ref >60)
GFR calc non Af Amer: 35 mL/min — ABNORMAL LOW (ref >60)
GLUCOSE: 147 mg/dL — AB (ref 65–99)
PHOSPHORUS: 3.8 mg/dL (ref 2.5–4.6)
POTASSIUM: 3.6 mmol/L (ref 3.5–5.1)
Sodium: 142 mmol/L (ref 135–145)

## 2015-06-15 LAB — CBC WITH DIFFERENTIAL/PLATELET
BASOS ABS: 0 10*3/uL (ref 0.0–0.1)
BASOS PCT: 0 %
EOS ABS: 0.2 10*3/uL (ref 0.0–0.7)
Eosinophils Relative: 3 %
HCT: 24.9 % — ABNORMAL LOW (ref 36.0–46.0)
HEMOGLOBIN: 7.7 g/dL — AB (ref 12.0–15.0)
Lymphocytes Relative: 14 %
Lymphs Abs: 1.2 10*3/uL (ref 0.7–4.0)
MCH: 28.8 pg (ref 26.0–34.0)
MCHC: 30.9 g/dL (ref 30.0–36.0)
MCV: 93.3 fL (ref 78.0–100.0)
MONOS PCT: 11 %
Monocytes Absolute: 0.9 10*3/uL (ref 0.1–1.0)
NEUTROS PCT: 72 %
Neutro Abs: 5.9 10*3/uL (ref 1.7–7.7)
Platelets: 173 10*3/uL (ref 150–400)
RBC: 2.67 MIL/uL — ABNORMAL LOW (ref 3.87–5.11)
RDW: 18.1 % — ABNORMAL HIGH (ref 11.5–15.5)
WBC: 8.2 10*3/uL (ref 4.0–10.5)

## 2015-06-15 LAB — CULTURE, BLOOD (ROUTINE X 2)
CULTURE: NO GROWTH
Culture: NO GROWTH

## 2015-06-15 LAB — APTT: APTT: 38 s — AB (ref 24–37)

## 2015-06-15 LAB — PROTIME-INR
INR: 0.96 (ref 0.00–1.49)
Prothrombin Time: 13 seconds (ref 11.6–15.2)

## 2015-06-15 LAB — GLUCOSE, CAPILLARY: GLUCOSE-CAPILLARY: 131 mg/dL — AB (ref 65–99)

## 2015-06-15 LAB — PREPARE RBC (CROSSMATCH)

## 2015-06-15 LAB — MAGNESIUM: Magnesium: 2 mg/dL (ref 1.7–2.4)

## 2015-06-15 LAB — FIBRINOGEN: FIBRINOGEN: 575 mg/dL — AB (ref 204–475)

## 2015-06-15 MED ORDER — OXYCODONE HCL 5 MG PO TABS
5.0000 mg | ORAL_TABLET | Freq: Four times a day (QID) | ORAL | Status: DC | PRN
  Administered 2015-06-15 – 2015-06-20 (×14): 5 mg via ORAL
  Filled 2015-06-15 (×15): qty 1

## 2015-06-15 MED ORDER — SODIUM CHLORIDE 0.9 % IV SOLN
Freq: Once | INTRAVENOUS | Status: AC
  Administered 2015-06-15: 08:00:00 via INTRAVENOUS

## 2015-06-15 MED ORDER — MORPHINE SULFATE (PF) 2 MG/ML IV SOLN
1.0000 mg | INTRAVENOUS | Status: DC | PRN
  Administered 2015-06-15 – 2015-06-20 (×14): 1 mg via INTRAVENOUS
  Filled 2015-06-15 (×14): qty 1

## 2015-06-15 NOTE — Progress Notes (Signed)
[facility] PROGRESS NOTE  Jasmine Newman Filler BWG:[id number] DOB: 10-07-1931 DOA: 06/01/2015 PCP: No primary care provider on file.  Interim summary/brief H&P 82 year old with past mental history of hypothyroidism, hypertension, hyperlipidemia, depression. Admitted today to the ED after she had a fall at home. She is watching the [facility] basketball game with her husband. When she stood up to use the bathroom her legs gave out and she fell on the right side. She hurt her leg and chest. She did not hit her head and there was no loss of consciousness. She stayed on the floor the whole night. When her son arrived next morning she was found to be lying in a large pool of melanotic stool. In the ED she was found to be hypertensive with hemoglobin of 7.4 (baseline unknown). She continued to be hypotensive despite of 2 L of fluid. GI evaluated the patient. PCCM called for admission. Patient ended experiencing need for intubation and pressors; initially failed  IR approach for embolization and subsequent required surgery. Hemodynamically stable and with good O2 sat currently. Course complicated with enterococcus bacteremia and either NSTEMI vs demand ischemia. Needs TEE. ID, CCS and cardiology on board.  She was seen and evaluated by physical therapy who recommended skilled nursing facility placement.  Assessment/Plan: 1 Respiratory failure with hypoxia: required intubation until 06/08/15 -Suspect secondary to multiple factors including pulmonary edema -Continues to have extensive bilateral crackles, a. Volume overload on physical examination. -Chest x-ray on 06/07/2015 had revealed interstitial and airspace opacities that could be consistent with edema. -Repeat chest x-ray on 06/11/2015 pending -On 06/11/2015 given ongoing evidence of volume overload including having extensive crackles on physical examination will increase Lasix to 40 mg IV twice a day -On my assessment on 06/12/2015 she appears improved  with decrease in crackles. She had urine output of 6.4 L Kidney function stable having creatinine 1.13 with BUN of 20.Transition to Lasix 40 mg by mouth twice daily on this date. -On 06/15/2015 she is stable from respiratory standpoint. Remains on Lasix 40 mg by mouth daily  2 Shock: sepsis vs secondary to upper GI bleed -resolved -BP stable -She received aggressive IV fluid resuscitation initially -Showing overall clinical improvement. Required IV Lasix for volume overload -Looks much better by 06/13/2015 -Plan to continue IV ampicillin as recommended by infectious disease. ID had recommended ampicillin for 28 days total from 06/10/2015 if no transesophageal echocardiogram would be done.  3 Aortic stenosis: s/p bioprosthetic valve -Transesophageal echocardiogram planned for 06/12/2015, having blood cultures growing enterococcus from 06/01/2015 and 06/04/2015 -keep good BP control  4 Suspect demand ischemia -She presented with septic shock, GI bleed requiring pressor support initially -2-D echo with wall motion abnormalities -cardiology following. Not on antiplatelet therapy due to GI bleed  5.  Upper GI bleed secondary to large duodenal ulcer -She presented with melanotic stools, EGD performed on 06/02/2015 revealed a large 3 cm posterior wall duodenal ulcer that had a large adherent clot. Initially injected with epinephrine. She continued to have upper GI bleed, interventional radiology consulted undergoing coil embolization on 06/05/2015. -She re-bled for which general surgery was consulted and evaluated her on 06/06/2015. She was taken to the operating room on 06/06/2015 where she underwent exploratory laparotomy with oversewing of duodenal ulcer. -She has required blood transfusions during this hospitalization -Lab work on 06/11/2015 show a hemoglobin of 8.3 with hematocrit of 24.9 -continue PPI -On 06/15/2015 hemoglobin trended down to 7.7 for which she was transfused 1 unit of packed  red blood cells. On  this date spoke with general surgery who agree with the discontinuation of TNA as she was tolerating soft diet.  6 Acute kidney injury: due to shock episodes and decrease perfusion -On 06/14/2015 labs showing creatinine of 1.34. -Having an upward trend in her creatinine will decrease her Lasix dose to 40 mg by mouth daily (previously and 40 mg by mouth twice a day) -Repeat BMP in a.m.  7 Acute blood loss anemia -Secondary to upper GI bleed in setting of large duodenal ulcer -transfusion as needed; goal is for Hgb > 8 -follow Hgb and platelets trend -On 06/15/2015 revealed a downward trend in hemoglobin to 7.74 she was transfused 1 unit of packed red blood cells  8-Sepsis: secondary to enterococcus bacteremia -Present on admission  -Blood cultures obtained on 06/01/2015 and 06/04/2015 growing enterococcus species -Transesophageal echocardiogram had been planned for 06/12/2015, however, patient declined procedure. -continue ampicillin; ID on board -Dr Linus Salmons recommending prolonged course of ampicillin IV for 28 days total from 06/10/2015 if no transesophageal echocardiogram could be performed.  9 Left heel ulcer: present prior to admission -follow WOC recommendations -continue prevlon boots and preventive measurements   10 Modertae protein calorie malnutrition  -Remains TPN for now  11. Hypokalemia -A.m. labs showing potassium of 3.2, likely secondary to diuretic therapy. -Will provide Kdur 40 meq by mouth 2 -On 06/13/2015 lab work showed improvement in potassium at 3.8  Code Status: Full Family Communication: no family at bedside  Disposition Plan: remains in stepdown;   Consultants:  PCCM  CCS  ID  Cardiology (for TEE)  Procedures/studies: X ray rib 4/2: No fractures Port CXR 4/2: Questionable right basal hazy opacity. R IJ CVL in mid-SVC. Chronic elevation left hemidiaphragm.  R Knee 2View 4/2: No fracture or dislocation. Port CXR 4/4:  Silhouetting of left hemidiaphragm unchanged. Endotracheal tube and central venous catheter in good position. No new focal opacity. TTE 4/4: LV normal in size with EF 45-50%. Akinesis of anterior septal myocardium. Grade 2 diastolic dysfunction. LA & RA normal in size. RV normal in size and function. PASP 53mHg. Moderate aortic stenosis w/ bioprosthetic aortic valve without regurgitation. Severe mitral vegetation without stenosis. Trivial pulmonic regurgitation without stenosis. Moderate tricuspid regurgitation. Port CXR 4/8: Endotracheal tube and central line in good position. Low lung volumes. Hazy opacification bilaterally.  Antibiotics:  Ampicillin   HPI/Subjective: She continues to make progress, looks better overall  Objective: Filed Vitals:   06/15/15 1227 06/15/15 1301  BP: 104/51 96/55  Pulse: 80 80  Temp: 97.8 F (36.6 C) 97.7 F (36.5 C)  Resp: 20 14    Intake/Output Summary (Last 24 hours) at 06/15/15 1534 Last data filed at 06/15/15 1439  Gross per 24 hour  Intake 755.17 ml  Output   2925 ml  Net -2169.83 ml   Filed Weights   06/13/15 0340 06/14/15 1000 06/15/15 0508  Weight: 104.8 kg (231 lb 0.7 oz) 105.2 kg (231 lb 14.8 oz) 103.6 kg (228 lb 6.3 oz)    Exam:   General:  Afebrile, complaining of soreness in her abd, no nausea, no vomiting. Tolerating full liquids  Cardiovascular: rate controlled currently, no rubs or gallops, positive JVD, positive SM  Respiratory: Improvement to crackles, no wheezing or rales  Abdomen: soft, positive BS, drain in place, mild tenderness with palpation; no guarding   Musculoskeletal: Improved edema and left heel ulcer appreciated; no cyanosis; SCD's in place  Data Reviewed: Basic Metabolic Panel:  Recent Labs Lab 06/11/15 0440 06/12/15 0330 06/13/15 0510 06/14/15  0445 06/15/15 0514  NA 151* 144 143 143 142  K 3.1* 3.2* 3.8 3.7 3.6  CL 116* 105 101 98* 96*  CO2 30 33* 38* 37* 38*  GLUCOSE 135* 137* 148* 125*  147*  BUN 14 20 27* 31* 28*  CREATININE 1.22* 1.13* 1.16* 1.34* 1.37*  CALCIUM 8.1* 7.7* 7.9* 8.1* 8.1*  MG 2.2 1.8 2.0 1.8 2.0  PHOS 2.9 2.9 3.1 3.4 3.8   Liver Function Tests:  Recent Labs Lab 06/10/15 0135 06/11/15 0440 06/12/15 0330 06/13/15 0510 06/14/15 0445 06/15/15 0514  AST 23  --  34  --   --   --   ALT 16  --  21  --   --   --   ALKPHOS 65  --  86  --   --   --   BILITOT 0.8  --  0.6  --   --   --   PROT 5.4*  --  5.2*  --   --   --   ALBUMIN 2.2* 1.9* 1.9* 1.7* 1.7* 1.7*   CBC:  Recent Labs Lab 06/11/15 0440 06/12/15 0330 06/13/15 0510 06/14/15 0445 06/15/15 0514  WBC 6.8 7.6 8.9 8.4 8.2  NEUTROABS 5.0 5.4 6.6 6.1 5.9  HGB 8.3* 8.8* 8.2* 8.0* 7.7*  HCT 24.9* 27.7* 25.9* 26.0* 24.9*  MCV 91.2 93.9 94.9 95.2 93.3  PLT 125* 131* 129* 148* 173   Cardiac Enzymes: No results for input(s): CKTOTAL, CKMB, CKMBINDEX, TROPONINI in the last 168 hours. BNP (last 3 results)  Recent Labs  06/01/15 2100  BNP 329.7*   CBG:  Recent Labs Lab 06/13/15 2327 06/14/15 0737 06/14/15 1522 06/14/15 2241 06/15/15 0637  GLUCAP 112* 128* 111* 141* 131*    Recent Results (from the past 240 hour(s))  Culture, blood (routine x 2)     Status: None (Preliminary result)   Collection Time: 06/10/15  1:30 AM  Result Value Ref Range Status   Specimen Description BLOOD BLOOD RIGHT WRIST  Final   Special Requests IN PEDIATRIC BOTTLE [place]  Final   Culture   Final    NO GROWTH 4 DAYS Performed at [facility]    Report Status PENDING  Incomplete  Culture, blood (routine x 2)     Status: None (Preliminary result)   Collection Time: 06/10/15  1:35 AM  Result Value Ref Range Status   Specimen Description BLOOD RIGHT HAND  Final   Special Requests BOTTLES DRAWN AEROBIC AND ANAEROBIC 5CC  Final   Culture   Final    NO GROWTH 4 DAYS Performed at [facility]    Report Status PENDING  Incomplete     Studies: No results found.  Scheduled Meds: .  ampicillin (OMNIPEN) IV  2 g Intravenous Q6H  . antiseptic oral rinse  7 mL Mouth Rinse QID  . chlorhexidine gluconate (SAGE KIT)  15 mL Mouth Rinse BID  . furosemide  40 mg Oral Daily  . levothyroxine  50 mcg Oral QAC breakfast  . pantoprazole (PROTONIX) IV  40 mg Intravenous Q12H  . sodium chloride flush  10-40 mL Intracatheter Q12H   Continuous Infusions: . dextrose 10 mL/hr at 06/15/15 1431    Active Problems:   GI bleed   Pressure ulcer   Gastrointestinal hemorrhage associated with duodenal ulcer   Hypovolemic shock (HCC)   Bacteremia   NSTEMI (non-ST elevated myocardial infarction) (HCC)   Malnutrition of moderate degree   Anemia due to acute blood loss  Time spent: 25 minutes (> 50% dedicated to face to face examination, coordination of care and intercommunication with consultants)   Kelvin Cellar  [facility] Pager [phone]. If 7PM-7AM, please contact night-coverage at [email or web address], password [facility] 06/15/2015, 3:34 PM  LOS: 14 days

## 2015-06-16 ENCOUNTER — Encounter (HOSPITAL_COMMUNITY)
Admission: EM | Disposition: A | Discharge status: Skilled Nursing Facility | Payer: Self-pay | Source: Home / Self Care | Attending: Pulmonary Disease

## 2015-06-16 LAB — PROTIME-INR
INR: 0.97 (ref 0.00–1.49)
PROTHROMBIN TIME: 13.1 s (ref 11.6–15.2)

## 2015-06-16 LAB — FIBRINOGEN: FIBRINOGEN: 676 mg/dL — AB (ref 204–475)

## 2015-06-16 LAB — CBC WITH DIFFERENTIAL/PLATELET
BASOS ABS: 0 10*3/uL (ref 0.0–0.1)
BASOS PCT: 0 %
EOS ABS: 0.3 10*3/uL (ref 0.0–0.7)
EOS PCT: 4 %
HCT: 27.1 % — ABNORMAL LOW (ref 36.0–46.0)
Hemoglobin: 8.4 g/dL — ABNORMAL LOW (ref 12.0–15.0)
LYMPHS PCT: 15 %
Lymphs Abs: 0.9 10*3/uL (ref 0.7–4.0)
MCH: 28.8 pg (ref 26.0–34.0)
MCHC: 31 g/dL (ref 30.0–36.0)
MCV: 92.8 fL (ref 78.0–100.0)
MONO ABS: 0.9 10*3/uL (ref 0.1–1.0)
Monocytes Relative: 14 %
Neutro Abs: 4 10*3/uL (ref 1.7–7.7)
Neutrophils Relative %: 67 %
PLATELETS: 205 10*3/uL (ref 150–400)
RBC: 2.92 MIL/uL — AB (ref 3.87–5.11)
RDW: 18.9 % — AB (ref 11.5–15.5)
WBC: 6 10*3/uL (ref 4.0–10.5)

## 2015-06-16 LAB — RENAL FUNCTION PANEL
Albumin: 1.7 g/dL — ABNORMAL LOW (ref 3.5–5.0)
Anion gap: 12 (ref 5–15)
BUN: 22 mg/dL — ABNORMAL HIGH (ref 6–20)
CHLORIDE: 93 mmol/L — AB (ref 101–111)
CO2: 38 mmol/L — AB (ref 22–32)
CREATININE: 1.31 mg/dL — AB (ref 0.44–1.00)
Calcium: 8.5 mg/dL — ABNORMAL LOW (ref 8.9–10.3)
GFR calc non Af Amer: 37 mL/min — ABNORMAL LOW (ref >60)
GFR, EST AFRICAN AMERICAN: 43 mL/min — AB (ref >60)
Glucose, Bld: 108 mg/dL — ABNORMAL HIGH (ref 65–99)
POTASSIUM: 3.5 mmol/L (ref 3.5–5.1)
Phosphorus: 3.9 mg/dL (ref 2.5–4.6)
Sodium: 143 mmol/L (ref 135–145)

## 2015-06-16 LAB — TYPE AND SCREEN
ABO/RH(D): O POS
Antibody Screen: NEGATIVE
Unit division: 0

## 2015-06-16 LAB — APTT: APTT: 35 s (ref 24–37)

## 2015-06-16 LAB — MAGNESIUM: MAGNESIUM: 2 mg/dL (ref 1.7–2.4)

## 2015-06-16 SURGERY — ECHOCARDIOGRAM, TRANSESOPHAGEAL
Anesthesia: Moderate Sedation

## 2015-06-16 MED ORDER — FUROSEMIDE 20 MG PO TABS
20.0000 mg | ORAL_TABLET | Freq: Every day | ORAL | Status: DC
  Administered 2015-06-17: 20 mg via ORAL
  Filled 2015-06-16: qty 1

## 2015-06-16 NOTE — Progress Notes (Signed)
Contacted by CSW that patient and husband did not want SNF but want to go home and "do the best they can". Spoke with husband on the phone and expressed concern for his ability to manage patient as she is now bedbound and unable to assist with even rolling side to side without max assist. Husband states he will do what patient wants, if she agrees to SNF he is agreeable but if she wants to go home he will do the best he can. Spoke with patient and let her know I discussed with husband that he was open to SNF, patient was adamant that she was not going to SNF, she said her husband could take care of her. I pressed her that that would be a huge burden of care for her husband and if he became ill she would be without a caregiver. She said her son would help if needed. I asked if she had spoken with her son about her plans and she said no. I stressed that she currently requires a great deal of physical care and I was concerned about her husbands ability to care for her, she again said she was not going to SNF. Patient states she has a hospital bed. She said prior to her hospitalization she was not ambulatory but was able to get from bed to w/c or BSC. She states she will get a catheter so she won't have to go to the bathroom and plans to use a bedpan. She plans to spend time in her bed and not get up to a w/c anymore. PT was present during part of the interview, they continue to recommend SNF.

## 2015-06-16 NOTE — Progress Notes (Signed)
[facility] PROGRESS NOTE  Jasmine Newman EXB:[id number] DOB: 03-26-1931 DOA: 06/01/2015 PCP: No primary care provider on file.  Interim summary/brief H&P 82 year old with past mental history of hypothyroidism, hypertension, hyperlipidemia, depression. Admitted today to the ED after she had a fall at home. She is watching the [facility] basketball game with her husband. When she stood up to use the bathroom her legs gave out and she fell on the right side. She hurt her leg and chest. She did not hit her head and there was no loss of consciousness. She stayed on the floor the whole night. When her son arrived next morning she was found to be lying in a large pool of melanotic stool. In the ED she was found to be hypertensive with hemoglobin of 7.4 (baseline unknown). She continued to be hypotensive despite of 2 L of fluid. GI evaluated the patient. PCCM called for admission. Patient ended experiencing need for intubation and pressors; initially failed  IR approach for embolization and subsequent required surgery. Hemodynamically stable and with good O2 sat currently. Course complicated with enterococcus bacteremia and either NSTEMI vs demand ischemia. Needs TEE. ID, CCS and cardiology on board.  On 06/16/2015 She was seen and evaluated by physical therapy who recommended skilled nursing facility placement. I spoke to Jasmine and Jasmine Newman regarding placement to skilled nursing facility. Jasmine Newman continues to be adamant about going home, when asked how her husband would me her needs, she replied "well he's done it in the past and will figure it out." I explained that she had significant physical deconditioning and having increase in her healthcare needs, situation where her husband may not be able to provide the care she needs.  Jasmine Newman told me that he would speak with their son about this and have a family meeting recognizing that he may not be able to care for her.      Assessment/Plan: 1 Respiratory failure with hypoxia: required intubation until 06/08/15 -Suspect secondary to multiple factors including pulmonary edema -Continues to have extensive bilateral crackles, a. Volume overload on physical examination. -Chest x-ray on 06/07/2015 had revealed interstitial and airspace opacities that could be consistent with edema. -Repeat chest x-ray on 06/11/2015 pending -On 06/11/2015 given ongoing evidence of volume overload including having extensive crackles on physical examination will increase Lasix to 40 mg IV twice a day -On my assessment on 06/12/2015 she appears improved with decrease in crackles. She had urine output of 6.4 L Kidney function stable having creatinine 1.13 with BUN of 20.Transition to Lasix 40 mg by mouth twice daily on this date. -On 06/15/2015 she is stable from respiratory standpoint. Remains on Lasix 40 mg by mouth daily  2 Shock: sepsis vs secondary to upper GI bleed -resolved -BP stable -She received aggressive IV fluid resuscitation initially -Showing overall clinical improvement. Required IV Lasix for volume overload -Looks much better by 06/13/2015 -Plan to continue IV ampicillin as recommended by infectious disease. ID had recommended ampicillin for 28 days total from 06/10/2015 if no transesophageal echocardiogram would be done.  3 Aortic stenosis: s/p bioprosthetic valve -Transesophageal echocardiogram planned for 06/12/2015, having blood cultures growing enterococcus from 06/01/2015 and 06/04/2015 -keep good BP control  4 Suspect demand ischemia -She presented with septic shock, GI bleed requiring pressor support initially -2-D echo with wall motion abnormalities -cardiology following. Not on antiplatelet therapy due to GI bleed  5.  Upper GI bleed secondary to large duodenal ulcer -She presented with melanotic stools, EGD performed on 06/02/2015 revealed  a large 3 cm posterior wall duodenal ulcer that had a large  adherent clot. Initially injected with epinephrine. She continued to have upper GI bleed, interventional radiology consulted undergoing coil embolization on 06/05/2015. -She re-bled for which general surgery was consulted and evaluated her on 06/06/2015. She was taken to the operating room on 06/06/2015 where she underwent exploratory laparotomy with oversewing of duodenal ulcer. -She has required blood transfusions during this hospitalization -Lab work on 06/11/2015 show a hemoglobin of 8.3 with hematocrit of 24.9 -continue PPI -On 06/15/2015 hemoglobin trended down to 7.7 for which she was transfused 1 unit of packed red blood cells. On this date spoke with general surgery who agree with the discontinuation of TNA as she was tolerating soft diet.  6 Acute kidney injury: due to shock episodes and decrease perfusion -On 06/14/2015 labs showing creatinine of 1.34. -Having an upward trend in her creatinine will decrease her Lasix dose to 40 mg by mouth daily (previously and 40 mg by mouth twice a day) -On 06/16/2015 Lasix decreased from 40 mg by mouth daily to 20 g by mouth daily  7 Acute blood loss anemia -Secondary to upper GI bleed in setting of large duodenal ulcer -transfusion as needed; goal is for Hgb > 8 -follow Hgb and platelets trend -On 06/15/2015 revealed a downward trend in hemoglobin to 7.74 she was transfused 1 unit of packed red blood cells -Repeat lab work on 06/16/2015 showing stable hemoglobin of 8.4.  8-Sepsis: secondary to enterococcus bacteremia -Present on admission  -Blood cultures obtained on 06/01/2015 and 06/04/2015 growing enterococcus species -Transesophageal echocardiogram had been planned for 06/12/2015, however, patient declined procedure. -continue ampicillin; ID on board -Dr Linus Salmons recommending prolonged course of ampicillin IV for 28 days total from 06/10/2015 if no transesophageal echocardiogram could be performed.  9 Left heel ulcer: present prior to  admission -follow WOC recommendations -continue prevlon boots and preventive measurements   10 Modertae protein calorie malnutrition  -Remains TPN for now  11. Hypokalemia -A.m. labs showing potassium of 3.2, likely secondary to diuretic therapy. -Will provide Kdur 40 meq by mouth 2 -On 06/13/2015 lab work showed improvement in potassium at 3.8  Code Status: Full Family Communication: no family at bedside  Disposition Plan: remains in stepdown;   Consultants:  PCCM  CCS  ID  Cardiology (for TEE)  Procedures/studies: X ray rib 4/2: No fractures Port CXR 4/2: Questionable right basal hazy opacity. R IJ CVL in mid-SVC. Chronic elevation left hemidiaphragm.  R Knee 2View 4/2: No fracture or dislocation. Port CXR 4/4: Silhouetting of left hemidiaphragm unchanged. Endotracheal tube and central venous catheter in good position. No new focal opacity. TTE 4/4: LV normal in size with EF 45-50%. Akinesis of anterior septal myocardium. Grade 2 diastolic dysfunction. LA & RA normal in size. RV normal in size and function. PASP 42mHg. Moderate aortic stenosis w/ bioprosthetic aortic valve without regurgitation. Severe mitral vegetation without stenosis. Trivial pulmonic regurgitation without stenosis. Moderate tricuspid regurgitation. Port CXR 4/8: Endotracheal tube and central line in good position. Low lung volumes. Hazy opacification bilaterally.  Antibiotics:  Ampicillin   HPI/Subjective: She continues to make progress, looks better overall  Objective: Filed Vitals:   06/16/15 0600 06/16/15 1422  BP: 104/63 101/61  Pulse: 77 79  Temp: 98 F (36.7 C) 98.1 F (36.7 C)  Resp: 16 15    Intake/Output Summary (Last 24 hours) at 06/16/15 1620 Last data filed at 06/16/15 1422  Gross per 24 hour  Intake  940 ml  Output   2955 ml  Net  -2015 ml   Filed Weights   06/13/15 0340 06/14/15 1000 06/15/15 0508  Weight: 104.8 kg (231 lb 0.7 oz) 105.2 kg (231 lb 14.8 oz) 103.6  kg (228 lb 6.3 oz)    Exam:   General:  Afebrile, complaining of soreness in her abd, no nausea, no vomiting.   Cardiovascular: rate controlled currently, no rubs or gallops, positive JVD, positive SM  Respiratory: Improvement to crackles, no wheezing or rales  Abdomen: soft, positive BS, drain in place, mild tenderness with palpation; no guarding   Musculoskeletal: Improved edema and left heel ulcer appreciated; no cyanosis; SCD's in place  Data Reviewed: Basic Metabolic Panel:  Recent Labs Lab 06/12/15 0330 06/13/15 0510 06/14/15 0445 06/15/15 0514 06/16/15 0400  NA 144 143 143 142 143  K 3.2* 3.8 3.7 3.6 3.5  CL 105 101 98* 96* 93*  CO2 33* 38* 37* 38* 38*  GLUCOSE 137* 148* 125* 147* 108*  BUN 20 27* 31* 28* 22*  CREATININE 1.13* 1.16* 1.34* 1.37* 1.31*  CALCIUM 7.7* 7.9* 8.1* 8.1* 8.5*  MG 1.8 2.0 1.8 2.0 2.0  PHOS 2.9 3.1 3.4 3.8 3.9   Liver Function Tests:  Recent Labs Lab 06/10/15 0135  06/12/15 0330 06/13/15 0510 06/14/15 0445 06/15/15 0514 06/16/15 0400  AST 23  --  34  --   --   --   --   ALT 16  --  21  --   --   --   --   ALKPHOS 65  --  86  --   --   --   --   BILITOT 0.8  --  0.6  --   --   --   --   PROT 5.4*  --  5.2*  --   --   --   --   ALBUMIN 2.2*  < > 1.9* 1.7* 1.7* 1.7* 1.7*  < > = values in this interval not displayed. CBC:  Recent Labs Lab 06/12/15 0330 06/13/15 0510 06/14/15 0445 06/15/15 0514 06/16/15 0400  WBC 7.6 8.9 8.4 8.2 6.0  NEUTROABS 5.4 6.6 6.1 5.9 4.0  HGB 8.8* 8.2* 8.0* 7.7* 8.4*  HCT 27.7* 25.9* 26.0* 24.9* 27.1*  MCV 93.9 94.9 95.2 93.3 92.8  PLT 131* 129* 148* 173 205   Cardiac Enzymes: No results for input(s): CKTOTAL, CKMB, CKMBINDEX, TROPONINI in the last 168 hours. BNP (last 3 results)  Recent Labs  06/01/15 2100  BNP 329.7*   CBG:  Recent Labs Lab 06/13/15 2327 06/14/15 0737 06/14/15 1522 06/14/15 2241 06/15/15 0637  GLUCAP 112* 128* 111* 141* 131*    Recent Results (from the past  240 hour(s))  Culture, blood (routine x 2)     Status: None   Collection Time: 06/10/15  1:30 AM  Result Value Ref Range Status   Specimen Description BLOOD BLOOD RIGHT WRIST  Final   Special Requests IN PEDIATRIC BOTTLE [place]  Final   Culture   Final    NO GROWTH 5 DAYS Performed at [facility]    Report Status 06/15/2015 FINAL  Final  Culture, blood (routine x 2)     Status: None   Collection Time: 06/10/15  1:35 AM  Result Value Ref Range Status   Specimen Description BLOOD RIGHT HAND  Final   Special Requests BOTTLES DRAWN AEROBIC AND ANAEROBIC 5CC  Final   Culture   Final    NO GROWTH 5 DAYS  Performed at [facility]    Report Status 06/15/2015 FINAL  Final     Studies: No results found.  Scheduled Meds: . ampicillin (OMNIPEN) IV  2 g Intravenous Q6H  . antiseptic oral rinse  7 mL Mouth Rinse QID  . chlorhexidine gluconate (SAGE KIT)  15 mL Mouth Rinse BID  . furosemide  40 mg Oral Daily  . levothyroxine  50 mcg Oral QAC breakfast  . pantoprazole (PROTONIX) IV  40 mg Intravenous Q12H  . sodium chloride flush  10-40 mL Intracatheter Q12H   Continuous Infusions: . dextrose 10 mL/hr at 06/15/15 1431    Active Problems:   GI bleed   Pressure ulcer   Gastrointestinal hemorrhage associated with duodenal ulcer   Hypovolemic shock (HCC)   Bacteremia   NSTEMI (non-ST elevated myocardial infarction) ([place])   Malnutrition of moderate degree   Anemia due to acute blood loss   Time spent: 25 minutes (> 50% dedicated to face to face examination, coordination of care and intercommunication with consultants)   Kelvin Cellar  [facility] Pager [phone]. If 7PM-7AM, please contact night-coverage at [email or web address], password [facility] 06/16/2015, 4:20 PM  LOS: 15 days

## 2015-06-16 NOTE — Clinical Social Work Note (Signed)
CSW met with patient and patient's husband to discuss PT recommendation for SNF. Patient and patient's husband are not interested SNF placement at this time. The patient and patient's husband are planning for a discharge home. CSW made RNCM aware. CSW signing off.   [place], LCSW [phone]

## 2015-06-16 NOTE — Progress Notes (Signed)
10 Days Post-Op  Subjective: She is alert, poorly kept and lives with her husband.  She says she doesn't walk and she fell at home going to bedside commode.  From our standpoint her incision looks good, and drain is clear.  She is on a soft diet and reports +BM, even though it's not recorded.    Objective: Vital signs in last 24 hours: Temp:  [97.7 F (36.5 C)-98.5 F (36.9 C)] 98 F (36.7 C) (04/17 0600) Pulse Rate:  [77-87] 77 (04/17 0600) Resp:  [14-20] 16 (04/17 0600) BP: (96-116)/(41-63) 104/63 mmHg (04/17 0600) SpO2:  [98 %-100 %] 98 % (04/17 0600) Last BM Date: 06/15/15 600 po  Soft diet Urine 3525 Drain:  30 Afebrile, VSS Creatinine is stable WBC is normal and anemia is stable Intake/Output from previous day: 04/16 0701 - 04/17 0700 In: 1673.2 [P.O.:600; I.V.:625.2; Blood:348; IV Piggyback:100] Out: 4709 [Urine:3525; Drains:30] Intake/Output this shift:    General appearance: alert, cooperative and no distress GI: soft, still sore, incision looks fine, tolerating soft diet, + BS, +BM, drain is clear.  Lab Results:   Recent Labs  06/15/15 0514 06/16/15 0400  WBC 8.2 6.0  HGB 7.7* 8.4*  HCT 24.9* 27.1*  PLT 173 205    BMET  Recent Labs  06/15/15 0514 06/16/15 0400  NA 142 143  K 3.6 3.5  CL 96* 93*  CO2 38* 38*  GLUCOSE 147* 108*  BUN 28* 22*  CREATININE 1.37* 1.31*  CALCIUM 8.1* 8.5*   PT/INR  Recent Labs  06/15/15 0514 06/16/15 0400  LABPROT 13.0 13.1  INR 0.96 0.97     Recent Labs Lab 06/10/15 0135  06/12/15 0330 06/13/15 0510 06/14/15 0445 06/15/15 0514 06/16/15 0400  AST 23  --  34  --   --   --   --   ALT 16  --  21  --   --   --   --   ALKPHOS 65  --  86  --   --   --   --   BILITOT 0.8  --  0.6  --   --   --   --   PROT 5.4*  --  5.2*  --   --   --   --   ALBUMIN 2.2*  < > 1.9* 1.7* 1.7* 1.7* 1.7*  < > = values in this interval not displayed.   Lipase     Component Value Date/Time   LIPASE 36 06/01/2015 2100      Studies/Results: No results found.  Medications: . ampicillin (OMNIPEN) IV  2 g Intravenous Q6H  . antiseptic oral rinse  7 mL Mouth Rinse QID  . chlorhexidine gluconate (SAGE KIT)  15 mL Mouth Rinse BID  . furosemide  40 mg Oral Daily  . levothyroxine  50 mcg Oral QAC breakfast  . pantoprazole (PROTONIX) IV  40 mg Intravenous Q12H  . sodium chloride flush  10-40 mL Intracatheter Q12H    Assessment/Plan Bleeding giant duodenal ulcer S/p Emergency exploratory laparotomy with oversewing of bleeding giant duodenal ulcer, 06/06/15, Dr. Jackolyn Confer Respiratory failure with hypoxia Shock:sepsis vs GI bleed Demand ischemia Acute kidney injury PCM Anemia/multiple transfusions: Last transfusion 06/15/15 Antibiotics:  Ampicillin day 12 DVT:  SCD's    Plan:  She is 10 days post op, and doing well from surgical standpoint.  I would plan to get staples out day 14, and will check on pulling drain.       LOS: 15 days  Earnstine Regal 06/16/2015 [phone]

## 2015-06-16 NOTE — Progress Notes (Signed)
Physical Therapy Treatment Patient Details Name: Jasmine Newman MRN: [id number] DOB: 07/18/1931 Today's Date: 06/16/2015    History of Present Illness 82 year old with past mental history of hypothyroidism, hypertension, hyperlipidemia, depression. Admitted today to the ED after she had a fall at home. When she stood up to use the bathroom her legs gave out and she fell on the right side. She hurt her leg and chest. She did not hit her head and there was no loss of consciousness. She on the floor the whole night. When her son arrived next morning she was found to be lying in a large pool of melanotic stool. In the ED she was found to be hypertensive with hemoglobin of 7.4 (baseline unknown).  Pt is s/p Emergency exploratory laparotomy with oversewing of bleeding giant duodenal ulcer, 06/06/15    PT Comments    Pt assisted with rolling.  Pt believes spouse can provide her current assist level.  CM in room as well and pt adamant about going home (please refer to CM note).  Continue to recommend SNF at this time.  Follow Up Recommendations  SNF     Equipment Recommendations  None recommended by PT    Recommendations for Other Services       Precautions / Restrictions Precautions Precautions: Fall Precaution Comments: JP drain    Mobility  Bed Mobility Overal bed mobility: Needs Assistance Bed Mobility: Rolling Rolling: Max assist         General bed mobility comments: pt able to reach and self assist with use of hand rails however requiring assist for lower body and hips, more assist to rolling on left side utilizing bed pad, did not seem agreeable to any further activity and RN/NT into room for bathing  Transfers                    Ambulation/Gait                 Stairs            Wheelchair Mobility    Modified Rankin (Stroke Patients Only)       Balance                                    Cognition Arousal/Alertness:  Awake/alert Behavior During Therapy: WFL for tasks assessed/performed Overall Cognitive Status: No family/caregiver present to determine baseline cognitive functioning                      Exercises      General Comments        Pertinent Vitals/Pain Pain Assessment:  (no pain reported, RN into room end of session)    Home Living                      Prior Function            PT Goals (current goals can now be found in the care plan section) Progress towards PT goals: Not progressing toward goals - comment    Frequency  Min 2X/week    PT Plan Current plan remains appropriate    Co-evaluation             End of Session   Activity Tolerance: Patient limited by fatigue Patient left: in bed;with call bell/phone within reach;with nursing/sitter in room     Time: [id number] PT Time Calculation (min) (  ACUTE ONLY): 10 min  Charges:  $Therapeutic Activity: 8-22 mins                    G Codes:      Nalanie Winiecki,KATHrine E 06-28-15, 3:51 PM Zenovia Jarred, PT, DPT 2015-06-28 Pager: [phone]

## 2015-06-17 LAB — FIBRINOGEN: FIBRINOGEN: 667 mg/dL — AB (ref 204–475)

## 2015-06-17 LAB — PROTIME-INR
INR: 1.07 (ref 0.00–1.49)
Prothrombin Time: 14.1 seconds (ref 11.6–15.2)

## 2015-06-17 LAB — RENAL FUNCTION PANEL
Albumin: 1.6 g/dL — ABNORMAL LOW (ref 3.5–5.0)
Anion gap: 8 (ref 5–15)
BUN: 19 mg/dL (ref 6–20)
CHLORIDE: 93 mmol/L — AB (ref 101–111)
CO2: 40 mmol/L — AB (ref 22–32)
CREATININE: 1.48 mg/dL — AB (ref 0.44–1.00)
Calcium: 8.1 mg/dL — ABNORMAL LOW (ref 8.9–10.3)
GFR calc Af Amer: 37 mL/min — ABNORMAL LOW (ref >60)
GFR, EST NON AFRICAN AMERICAN: 32 mL/min — AB (ref >60)
Glucose, Bld: 89 mg/dL (ref 65–99)
Phosphorus: 3.7 mg/dL (ref 2.5–4.6)
Potassium: 3.1 mmol/L — ABNORMAL LOW (ref 3.5–5.1)
Sodium: 141 mmol/L (ref 135–145)

## 2015-06-17 LAB — URINE CULTURE

## 2015-06-17 LAB — CBC WITH DIFFERENTIAL/PLATELET
BASOS ABS: 0 10*3/uL (ref 0.0–0.1)
Basophils Relative: 0 %
Eosinophils Absolute: 0.2 10*3/uL (ref 0.0–0.7)
Eosinophils Relative: 3 %
HEMATOCRIT: 25.3 % — AB (ref 36.0–46.0)
Hemoglobin: 7.9 g/dL — ABNORMAL LOW (ref 12.0–15.0)
LYMPHS PCT: 18 %
Lymphs Abs: 1.1 10*3/uL (ref 0.7–4.0)
MCH: 29.2 pg (ref 26.0–34.0)
MCHC: 31.2 g/dL (ref 30.0–36.0)
MCV: 93.4 fL (ref 78.0–100.0)
Monocytes Absolute: 0.9 10*3/uL (ref 0.1–1.0)
Monocytes Relative: 14 %
NEUTROS ABS: 4 10*3/uL (ref 1.7–7.7)
NEUTROS PCT: 65 %
PLATELETS: 220 10*3/uL (ref 150–400)
RBC: 2.71 MIL/uL — AB (ref 3.87–5.11)
RDW: 17.8 % — ABNORMAL HIGH (ref 11.5–15.5)
WBC: 6.3 10*3/uL (ref 4.0–10.5)

## 2015-06-17 LAB — PREPARE RBC (CROSSMATCH)

## 2015-06-17 LAB — MAGNESIUM: Magnesium: 1.9 mg/dL (ref 1.7–2.4)

## 2015-06-17 LAB — APTT: aPTT: 37 seconds (ref 24–37)

## 2015-06-17 MED ORDER — POTASSIUM CHLORIDE CRYS ER 20 MEQ PO TBCR
40.0000 meq | EXTENDED_RELEASE_TABLET | Freq: Four times a day (QID) | ORAL | Status: AC
  Administered 2015-06-17 (×2): 40 meq via ORAL
  Filled 2015-06-17 (×2): qty 2

## 2015-06-17 MED ORDER — SODIUM CHLORIDE 0.9 % IV SOLN: Freq: Once | INTRAVENOUS | Status: DC

## 2015-06-17 MED ORDER — BOOST / RESOURCE BREEZE PO LIQD
1.0000 | Freq: Three times a day (TID) | ORAL | Status: DC
  Administered 2015-06-17 – 2015-06-20 (×6): 1 via ORAL

## 2015-06-17 MED ORDER — SODIUM CHLORIDE 0.9 % IV SOLN
INTRAVENOUS | Status: DC
  Administered 2015-06-17 – 2015-06-18 (×2): via INTRAVENOUS

## 2015-06-17 NOTE — Progress Notes (Signed)
11 Days Post-Op  Subjective: She feels bad this AM.  Drain is clear and we will pull that today.    Objective: Vital signs in last 24 hours: Temp:  [98 F (36.7 C)-98.1 F (36.7 C)] 98 F (36.7 C) (04/18 0507) Pulse Rate:  [78-81] 78 (04/18 0507) Resp:  [15-16] 16 (04/18 0507) BP: (101-104)/(46-61) 104/55 mmHg (04/18 0507) SpO2:  [95 %-97 %] 96 % (04/18 0507) Weight:  [100.2 kg (220 lb 14.4 oz)] 100.2 kg (220 lb 14.4 oz) (04/18 0507) Last BM Date: 06/16/15 600 Po recorded  Diet:  soft Urine 1300 Drain 150 Afebrile, VSS Creatinine is up  H/H is stable K+ 3.1 Intake/Output from previous day: 04/17 0701 - 04/18 0700 In: 9450 [P.O.:600; I.V.:940; IV Piggyback:150] Out: 1450 [Urine:1300; Drains:150] Intake/Output this shift: Total I/O In: 0  Out: 760 [Urine:700; Drains:60]  General appearance: alert, cooperative, no distress and doesn't feel good, but not really specific about where she feels bad. GI: soft, few BS, tolerating diet, drainage is clear.  incision looks fine.  Lab Results:   Recent Labs  06/16/15 0400 06/17/15 0403  WBC 6.0 6.3  HGB 8.4* 7.9*  HCT 27.1* 25.3*  PLT 205 220    BMET  Recent Labs  06/16/15 0400 06/17/15 0403  NA 143 141  K 3.5 3.1*  CL 93* 93*  CO2 38* 40*  GLUCOSE 108* 89  BUN 22* 19  CREATININE 1.31* 1.48*  CALCIUM 8.5* 8.1*   PT/INR  Recent Labs  06/16/15 0400 06/17/15 0403  LABPROT 13.1 14.1  INR 0.97 1.07     Recent Labs Lab 06/12/15 0330 06/13/15 0510 06/14/15 0445 06/15/15 0514 06/16/15 0400 06/17/15 0403  AST 34  --   --   --   --   --   ALT 21  --   --   --   --   --   ALKPHOS 86  --   --   --   --   --   BILITOT 0.6  --   --   --   --   --   PROT 5.2*  --   --   --   --   --   ALBUMIN 1.9* 1.7* 1.7* 1.7* 1.7* 1.6*     Lipase     Component Value Date/Time   LIPASE 36 06/01/2015 2100     Studies/Results: No results found.  Medications: . ampicillin (OMNIPEN) IV  2 g Intravenous Q6H  .  antiseptic oral rinse  7 mL Mouth Rinse QID  . chlorhexidine gluconate (SAGE KIT)  15 mL Mouth Rinse BID  . furosemide  20 mg Oral Daily  . levothyroxine  50 mcg Oral QAC breakfast  . pantoprazole (PROTONIX) IV  40 mg Intravenous Q12H  . sodium chloride flush  10-40 mL Intracatheter Q12H    Assessment/Plan Bleeding giant duodenal ulcer S/p Emergency exploratory laparotomy with oversewing of bleeding giant duodenal ulcer, 06/06/15, Dr. Jackolyn Confer Respiratory failure with hypoxia Shock:sepsis vs GI bleed Demand ischemia Acute kidney injury PCM Anemia/multiple transfusions: Last transfusion 06/15/15 Antibiotics: Ampicillin day 13 DVT: SCD's    Plan:  Pull drain, staples can come out on 06/20/15.     LOS: 16 days    Jasmine Newman 06/17/2015 [phone]

## 2015-06-17 NOTE — Progress Notes (Signed)
[facility] PROGRESS NOTE  Jasmine Newman XBD:[id number] DOB: 1932/03/01 DOA: 06/01/2015 PCP: No primary care provider on file.  Interim summary/brief H&P 82 year old with past mental history of hypothyroidism, hypertension, hyperlipidemia, depression. Admitted today to the ED after she had a fall at home. She is watching the [facility] basketball game with her husband. When she stood up to use the bathroom her legs gave out and she fell on the right side. She hurt her leg and chest. She did not hit her head and there was no loss of consciousness. She stayed on the floor the whole night. When her son arrived next morning she was found to be lying in a large pool of melanotic stool. In the ED she was found to be hypertensive with hemoglobin of 7.4 (baseline unknown). She continued to be hypotensive despite of 2 L of fluid. GI evaluated the patient. PCCM called for admission. Patient ended experiencing need for intubation and pressors; initially failed  IR approach for embolization and subsequent required surgery. Hemodynamically stable and with good O2 sat currently. Course complicated with enterococcus bacteremia and either NSTEMI vs demand ischemia. Infectious disease recommended TEE to rule out endocarditis. ID, CCS and cardiology were consulted. Early on she refused transesophageal echocardiogram as infectious disease recommended treating with 28 days of ampicillin from 06/10/2015. Now she has agreed to undergo procedure signing consent on 06/17/2015.  Plan for her to undergo transesophageal echocardiogram on 06/18/2015.   Regards to disposition she was seen and evaluated by physical therapy who recommended skilled nursing facility placement. I spoke to Mr and Mrs Quale regarding placement to skilled nursing facility. Mrs Kratz continues to be adamant about going home, when asked how her husband would me her needs, she replied "well he's done it in the past and will figure it out." I  explained that she had significant physical deconditioning and having increase in her healthcare needs, situation where her husband may not be able to provide the care she needs.  Mr Mcmonigle told me that he would speak with their son about this and have a family meeting recognizing that he may not be able to care for her.     Assessment/Plan: 1 Respiratory failure with hypoxia: required intubation until 06/08/15 -Suspect secondary to multiple factors including pulmonary edema -Continues to have extensive bilateral crackles, a. Volume overload on physical examination. -Chest x-ray on 06/07/2015 had revealed interstitial and airspace opacities that could be consistent with edema. -Repeat chest x-ray on 06/11/2015 pending -On 06/11/2015 given ongoing evidence of volume overload including having extensive crackles on physical examination will increase Lasix to 40 mg IV twice a day -On my assessment on 06/12/2015 she appears improved with decrease in crackles. She had urine output of 6.4 L Kidney function stable having creatinine 1.13 with BUN of 20.Transition to Lasix 40 mg by mouth twice daily on this date. -On 06/15/2015 she is stable from respiratory standpoint.  -On 06/17/2015 have an upward trend and creatinine 1.48 for which I will stop Lasix  2 Shock: sepsis vs secondary to upper GI bleed -resolved -BP stable -She received aggressive IV fluid resuscitation initially -Showing overall clinical improvement. Required IV Lasix for volume overload -Plan for transesophageal echocardiogram on 06/18/2015, patient now agreeable and has signed consent.  3 Aortic stenosis: s/p bioprosthetic valve -Transesophageal echocardiogram planned for 06/12/2015, having blood cultures growing enterococcus from 06/01/2015 and 06/04/2015 -keep good BP control  4 Suspect demand ischemia -She presented with septic shock, GI bleed requiring pressor support initially -2-D  echo with wall motion  abnormalities -cardiology following. Not on antiplatelet therapy due to GI bleed  5.  Upper GI bleed secondary to large duodenal ulcer -She presented with melanotic stools, EGD performed on 06/02/2015 revealed a large 3 cm posterior wall duodenal ulcer that had a large adherent clot. Initially injected with epinephrine. She continued to have upper GI bleed, interventional radiology consulted undergoing coil embolization on 06/05/2015. -She re-bled for which general surgery was consulted and evaluated her on 06/06/2015. She was taken to the operating room on 06/06/2015 where she underwent exploratory laparotomy with oversewing of duodenal ulcer. -She has required blood transfusions during this hospitalization -Lab work on 06/11/2015 show a hemoglobin of 8.3 with hematocrit of 24.9 -continue PPI -On 06/15/2015 hemoglobin trended down to 7.7 for which she was transfused 1 unit of packed red blood cells. On this date spoke with general surgery who agree with the discontinuation of TNA as she was tolerating soft diet. -Transfusing 1 unit of packed red blood cells on 06/17/2015 have a hemoglobin of 7.9  6 Acute kidney injury: due to shock episodes and decrease perfusion -On 06/14/2015 labs showing creatinine of 1.34. -Having an upward trend in her creatinine will decrease her Lasix dose to 40 mg by mouth daily (previously and 40 mg by mouth twice a day) -On 06/16/2015 Lasix decreased from 40 mg by mouth daily to 20 g by mouth daily -On 06/17/2015 lab work showing upward trend in creatinine to 1.48. Lasix stop. She'll be made nothing by mouth after midnight for transesophageal echocardiogram tomorrow, will provide gentle IV fluid hydration with normal saline running at 75 mL/hour  7 Acute blood loss anemia -Secondary to upper GI bleed in setting of large duodenal ulcer -transfusion as needed; goal is for Hgb > 8 -follow Hgb and platelets trend -Hemoglobin trended down to 7.9 on 06/17/2015. Does not  have clinical evidence of GI bleed. She'll be transfused 1 unit of packed red blood cells on this date.  8-Sepsis: secondary to enterococcus bacteremia -Present on admission  -Blood cultures obtained on 06/01/2015 and 06/04/2015 growing enterococcus species -Transesophageal echocardiogram had been planned for 06/12/2015, however, patient declined procedure. -continue ampicillin; ID on board -Dr Linus Salmons recommending prolonged course of ampicillin IV for 28 days total from 06/10/2015 if no transesophageal echocardiogram could be performed. -On 06/17/2015 patient now agreeable to undergo procedure. Transesophageal echocardiogram has been planned for 06/18/2015.  9 Left heel ulcer: present prior to admission -follow WOC recommendations -continue prevlon boots and preventive measurements   10. Hypokalemia -A.m. labs showing potassium of 3.1, likely resulting from diuretic therapy -Will replace with kdur 40 meq PO x 2  Code Status: Full Family Communication: I spoke to her husband on 06/16/2015 Disposition Plan: Plan for transesophageal echocardiogram on 06/18/2015  Consultants:  [place]  ID  Cardiology (for TEE)  Procedures/studies: X ray rib 4/2: No fractures Port CXR 4/2: Questionable right basal hazy opacity. R IJ CVL in mid-SVC. Chronic elevation left hemidiaphragm.  R Knee 2View 4/2: No fracture or dislocation. Port CXR 4/4: Silhouetting of left hemidiaphragm unchanged. Endotracheal tube and central venous catheter in good position. No new focal opacity. TTE 4/4: LV normal in size with EF 45-50%. Akinesis of anterior septal myocardium. Grade 2 diastolic dysfunction. LA & RA normal in size. RV normal in size and function. PASP 74mHg. Moderate aortic stenosis w/ bioprosthetic aortic valve without regurgitation. Severe mitral vegetation without stenosis. Trivial pulmonic regurgitation without stenosis. Moderate tricuspid regurgitation. Port CXR 4/8: Endotracheal tube  and  central line in good position. Low lung volumes. Hazy opacification bilaterally.  Antibiotics:  Ampicillin   HPI/Subjective: She continues to make progress, looks better overall  Objective: Filed Vitals:   06/17/15 0507 06/17/15 1438  BP: 104/55 105/47  Pulse: 78 80  Temp: 98 F (36.7 C) 98 F (36.7 C)  Resp: 16 16    Intake/Output Summary (Last 24 hours) at 06/17/15 1659 Last data filed at 06/17/15 1500  Gross per 24 hour  Intake 1006.33 ml  Output   1575 ml  Net -568.67 ml   Filed Weights   06/14/15 1000 06/15/15 0508 06/17/15 0507  Weight: 105.2 kg (231 lb 14.8 oz) 103.6 kg (228 lb 6.3 oz) 100.2 kg (220 lb 14.4 oz)    Exam:   General: Nontoxic appearing, awake and alert, hard of hearing  Cardiovascular: rate controlled currently, no rubs or gallops  Respiratory: Improvement to crackles, no wheezing or rales  Abdomen: soft, positive BS, drain in place, mild tenderness with palpation; no guarding   Musculoskeletal: Improved edema and left heel ulcer appreciated; no cyanosis; SCD's in place  Data Reviewed: Basic Metabolic Panel:  Recent Labs Lab 06/13/15 0510 06/14/15 0445 06/15/15 0514 06/16/15 0400 06/17/15 0403  NA 143 143 142 143 141  K 3.8 3.7 3.6 3.5 3.1*  CL 101 98* 96* 93* 93*  CO2 38* 37* 38* 38* 40*  GLUCOSE 148* 125* 147* 108* 89  BUN 27* 31* 28* 22* 19  CREATININE 1.16* 1.34* 1.37* 1.31* 1.48*  CALCIUM 7.9* 8.1* 8.1* 8.5* 8.1*  MG 2.0 1.8 2.0 2.0 1.9  PHOS 3.1 3.4 3.8 3.9 3.7   Liver Function Tests:  Recent Labs Lab 06/12/15 0330 06/13/15 0510 06/14/15 0445 06/15/15 0514 06/16/15 0400 06/17/15 0403  AST 34  --   --   --   --   --   ALT 21  --   --   --   --   --   ALKPHOS 86  --   --   --   --   --   BILITOT 0.6  --   --   --   --   --   PROT 5.2*  --   --   --   --   --   ALBUMIN 1.9* 1.7* 1.7* 1.7* 1.7* 1.6*   CBC:  Recent Labs Lab 06/13/15 0510 06/14/15 0445 06/15/15 0514 06/16/15 0400 06/17/15 0403  WBC 8.9  8.4 8.2 6.0 6.3  NEUTROABS 6.6 6.1 5.9 4.0 4.0  HGB 8.2* 8.0* 7.7* 8.4* 7.9*  HCT 25.9* 26.0* 24.9* 27.1* 25.3*  MCV 94.9 95.2 93.3 92.8 93.4  PLT 129* 148* 173 205 220   Cardiac Enzymes: No results for input(s): CKTOTAL, CKMB, CKMBINDEX, TROPONINI in the last 168 hours. BNP (last 3 results)  Recent Labs  06/01/15 2100  BNP 329.7*   CBG:  Recent Labs Lab 06/13/15 2327 06/14/15 0737 06/14/15 1522 06/14/15 2241 06/15/15 0637  GLUCAP 112* 128* 111* 141* 131*    Recent Results (from the past 240 hour(s))  Culture, blood (routine x 2)     Status: None   Collection Time: 06/10/15  1:30 AM  Result Value Ref Range Status   Specimen Description BLOOD BLOOD RIGHT WRIST  Final   Special Requests IN PEDIATRIC BOTTLE [place]  Final   Culture   Final    NO GROWTH 5 DAYS Performed at [facility]    Report Status 06/15/2015 FINAL  Final  Culture, blood (routine  x 2)     Status: None   Collection Time: 06/10/15  1:35 AM  Result Value Ref Range Status   Specimen Description BLOOD RIGHT HAND  Final   Special Requests BOTTLES DRAWN AEROBIC AND ANAEROBIC 5CC  Final   Culture   Final    NO GROWTH 5 DAYS Performed at [facility]    Report Status 06/15/2015 FINAL  Final     Studies: No results found.  Scheduled Meds: . ampicillin (OMNIPEN) IV  2 g Intravenous Q6H  . antiseptic oral rinse  7 mL Mouth Rinse QID  . chlorhexidine gluconate (SAGE KIT)  15 mL Mouth Rinse BID  . feeding supplement  1 Container Oral TID BM  . furosemide  20 mg Oral Daily  . levothyroxine  50 mcg Oral QAC breakfast  . pantoprazole (PROTONIX) IV  40 mg Intravenous Q12H  . sodium chloride flush  10-40 mL Intracatheter Q12H   Continuous Infusions: . dextrose 10 mL (06/17/15 0956)    Active Problems:   GI bleed   Pressure ulcer   Gastrointestinal hemorrhage associated with duodenal ulcer   Hypovolemic shock (HCC)   Bacteremia   NSTEMI (non-ST elevated myocardial infarction) ([place])    Malnutrition of moderate degree   Anemia due to acute blood loss   Time spent: 25 minutes (> 50% dedicated to face to face examination, coordination of care and intercommunication with consultants)   Kelvin Cellar  [facility] Pager [phone]. If 7PM-7AM, please contact night-coverage at [email or web address], password [facility] 06/17/2015, 4:59 PM  LOS: 16 days

## 2015-06-17 NOTE — Progress Notes (Signed)
Nutrition Follow-up  DOCUMENTATION CODES:   Non-severe (moderate) malnutrition in context of acute illness/injury, Obesity unspecified  INTERVENTION:   Provide Boost Breeze po TID, each supplement provides 250 kcal and 9 grams of protein Encourage PO intake RD to continue to monitor  NUTRITION DIAGNOSIS:   Inadequate oral intake related to poor appetite as evidenced by meal completion < 50%.  Revised diagnosis.  GOAL:   Patient will meet greater than or equal to 90% of their needs  Progressing.  MONITOR:   PO intake, Labs, Weight trends, I & O's, Skin  ASSESSMENT:   82 year old with past mental history of hypothyroidism, hypertension, hyperlipidemia, depression. Admitted today to the ED after she had a fall at home. She is [facility] the [facility] game with her husband. When she stood up to use the bathroom her legs gave out and she fell on the right side. She hurt her leg and chest. She did not hit her head and there was no loss of consciousness. She stayed on the floor the whole night. When her son arrived next morning she was found to be lying in a large pool of melanotic stool. In the ED she was found to be hypertensive with hemoglobin of 7.4 (baseline unknown).   TPN was d/c 4/16. Diet upgraded to soft diet, currently consuming 25-50% of meals. Weight has increased since admission, +30 lb. Lasix has been ordered. RD to order Boost Breeze supplement to provide extra kcal and protein.  Medications: Lasix tablet daily, D5 infusion @ 10 ml/hr -41 kcal Labs reviewed:  Low K Mg/Phos WNL  Diet Order:  DIET SOFT Room service appropriate?: Yes; Fluid consistency:: Thin  Skin:  Wound (see comment) (Stage 3 L foot pressure injury, Abdominal incision from 06/06/15)  Last BM:  4/17  Height:   Ht Readings from Last 1 Encounters:  06/01/15 5\' 5"  (1.651 m)    Weight:   Wt Readings from Last 1 Encounters:  06/17/15 220 lb 14.4 oz (100.2 kg)    Ideal Body Weight:  56.82  kg (kg)  BMI:  Body mass index is 36.76 kg/(m^2).  Estimated Nutritional Needs:   Kcal:  1700-1900  Protein:  75-85g  Fluid:  1.9L/day  EDUCATION NEEDS:   No education needs identified at this time  Tilda FrancoLindsey Shantel Wesely, MS, RD, LDN Pager: [phone] After Hours Pager: [phone]

## 2015-06-17 NOTE — Progress Notes (Signed)
TEE discussed w/ pt at bedside. Pt agreeable. Consent signed, in chart. [facility] transportation arranged for pick up at 1230. Procedure scheduled for 1500. NPO at [place].

## 2015-06-17 NOTE — Progress Notes (Signed)
Lasix stopped.MD paged for foley catheter removal. Order given to leave in until am for attending physican

## 2015-06-17 NOTE — Care Management Important Message (Signed)
Important Message  Patient Details  Name: Jasmine Newman MRN: [id number] Date of Birth: 1931-04-12   Medicare Important Message Given:  Yes    Haskell FlirtJamison, Jishnu Jenniges 06/17/2015, 10:37 AMImportant Message  Patient Details  Name: Jasmine Newman MRN: [id number] Date of Birth: 1931-04-12   Medicare Important Message Given:  Yes    Haskell FlirtJamison, Yocelin Vanlue 06/17/2015, 10:37 AM

## 2015-06-18 ENCOUNTER — Ambulatory Visit (HOSPITAL_COMMUNITY): Payer: Medicare Other

## 2015-06-18 ENCOUNTER — Inpatient Hospital Stay (HOSPITAL_COMMUNITY): Payer: Medicare Other | Admitting: Certified Registered Nurse Anesthetist

## 2015-06-18 ENCOUNTER — Encounter (HOSPITAL_COMMUNITY)
Admission: EM | Disposition: A | Discharge status: Skilled Nursing Facility | Payer: Self-pay | Source: Home / Self Care | Attending: Pulmonary Disease

## 2015-06-18 ENCOUNTER — Encounter (HOSPITAL_COMMUNITY): Payer: Self-pay | Admitting: *Deleted

## 2015-06-18 DIAGNOSIS — I330 Acute and subacute infective endocarditis: Secondary | ICD-10-CM

## 2015-06-18 DIAGNOSIS — I340 Nonrheumatic mitral (valve) insufficiency: Secondary | ICD-10-CM

## 2015-06-18 HISTORY — PX: TEE WITHOUT CARDIOVERSION: SHX5443

## 2015-06-18 LAB — CBC WITH DIFFERENTIAL/PLATELET
BASOS ABS: 0 10*3/uL (ref 0.0–0.1)
Basophils Relative: 0 %
EOS ABS: 0.2 10*3/uL (ref 0.0–0.7)
EOS PCT: 3 %
HCT: 27.2 % — ABNORMAL LOW (ref 36.0–46.0)
Hemoglobin: 8.5 g/dL — ABNORMAL LOW (ref 12.0–15.0)
LYMPHS PCT: 21 %
Lymphs Abs: 1.2 10*3/uL (ref 0.7–4.0)
MCH: 28.6 pg (ref 26.0–34.0)
MCHC: 31.3 g/dL (ref 30.0–36.0)
MCV: 91.6 fL (ref 78.0–100.0)
MONO ABS: 0.8 10*3/uL (ref 0.1–1.0)
Monocytes Relative: 14 %
Neutro Abs: 3.6 10*3/uL (ref 1.7–7.7)
Neutrophils Relative %: 62 %
PLATELETS: 244 10*3/uL (ref 150–400)
RBC: 2.97 MIL/uL — ABNORMAL LOW (ref 3.87–5.11)
RDW: 18.2 % — AB (ref 11.5–15.5)
WBC: 5.8 10*3/uL (ref 4.0–10.5)

## 2015-06-18 LAB — PROTIME-INR
INR: 1.16 (ref 0.00–1.49)
PROTHROMBIN TIME: 14.5 s (ref 11.6–15.2)

## 2015-06-18 LAB — FIBRINOGEN: FIBRINOGEN: 705 mg/dL — AB (ref 204–475)

## 2015-06-18 LAB — RENAL FUNCTION PANEL
ANION GAP: 8 (ref 5–15)
Albumin: 1.6 g/dL — ABNORMAL LOW (ref 3.5–5.0)
BUN: 18 mg/dL (ref 6–20)
CHLORIDE: 95 mmol/L — AB (ref 101–111)
CO2: 36 mmol/L — AB (ref 22–32)
CREATININE: 1.48 mg/dL — AB (ref 0.44–1.00)
Calcium: 8.1 mg/dL — ABNORMAL LOW (ref 8.9–10.3)
GFR, EST AFRICAN AMERICAN: 37 mL/min — AB (ref >60)
GFR, EST NON AFRICAN AMERICAN: 32 mL/min — AB (ref >60)
Glucose, Bld: 93 mg/dL (ref 65–99)
POTASSIUM: 3.9 mmol/L (ref 3.5–5.1)
Phosphorus: 3.6 mg/dL (ref 2.5–4.6)
Sodium: 139 mmol/L (ref 135–145)

## 2015-06-18 LAB — MAGNESIUM: MAGNESIUM: 2 mg/dL (ref 1.7–2.4)

## 2015-06-18 LAB — TYPE AND SCREEN
ABO/RH(D): O POS
ANTIBODY SCREEN: NEGATIVE
UNIT DIVISION: 0

## 2015-06-18 LAB — APTT: aPTT: 38 seconds — ABNORMAL HIGH (ref 24–37)

## 2015-06-18 SURGERY — ECHOCARDIOGRAM, TRANSESOPHAGEAL
Anesthesia: Moderate Sedation

## 2015-06-18 MED ORDER — BUTAMBEN-TETRACAINE-BENZOCAINE 2-2-14 % EX AERO
INHALATION_SPRAY | CUTANEOUS | Status: DC | PRN
  Administered 2015-06-18 (×2): 1 via TOPICAL

## 2015-06-18 MED ORDER — MIDAZOLAM HCL 5 MG/ML IJ SOLN
INTRAMUSCULAR | Status: AC
  Filled 2015-06-18: qty 2

## 2015-06-18 MED ORDER — PROPOFOL 500 MG/50ML IV EMUL
INTRAVENOUS | Status: DC | PRN
Start: 2015-06-18 — End: 2015-06-18
  Administered 2015-06-18: 40 ug/kg/min via INTRAVENOUS

## 2015-06-18 MED ORDER — PHENYLEPHRINE HCL 10 MG/ML IJ SOLN
INTRAMUSCULAR | Status: DC | PRN
  Administered 2015-06-18 (×2): 80 ug via INTRAVENOUS

## 2015-06-18 MED ORDER — DEXTROSE 5 % IV SOLN
2.0000 g | Freq: Two times a day (BID) | INTRAVENOUS | Status: DC
  Administered 2015-06-18 – 2015-06-20 (×4): 2 g via INTRAVENOUS
  Filled 2015-06-18 (×5): qty 2

## 2015-06-18 MED ORDER — PROPOFOL 10 MG/ML IV BOLUS
INTRAVENOUS | Status: DC | PRN
  Administered 2015-06-18: 20 mg via INTRAVENOUS
  Administered 2015-06-18: 10 mg via INTRAVENOUS
  Administered 2015-06-18: 20 mg via INTRAVENOUS

## 2015-06-18 MED ORDER — ALBUTEROL SULFATE HFA 108 (90 BASE) MCG/ACT IN AERS
INHALATION_SPRAY | RESPIRATORY_TRACT | Status: DC | PRN
  Administered 2015-06-18: 2 via RESPIRATORY_TRACT

## 2015-06-18 MED ORDER — FENTANYL CITRATE (PF) 100 MCG/2ML IJ SOLN
INTRAMUSCULAR | Status: AC
  Filled 2015-06-18: qty 2

## 2015-06-18 NOTE — Transfer of Care (Signed)
Immediate Anesthesia Transfer of Care Note  Patient: Jasmine Newman  Procedure(s) Performed: Procedure(s): TRANSESOPHAGEAL ECHOCARDIOGRAM (TEE) (N/A)  Patient Location: PACU and Endoscopy Unit  Anesthesia Type:MAC  Level of Consciousness: sedated and patient cooperative  Airway & Oxygen Therapy: Patient Spontanous Breathing and Patient connected to nasal cannula oxygen  Post-op Assessment: Report given to RN, Post -op Vital signs reviewed and stable and Patient moving all extremities X 4  Post vital signs: stable  Last Vitals:  Filed Vitals:   06/18/15 1520 06/18/15 1525  BP: 114/77 135/59  Pulse: 81 79  Temp:    Resp: 21 14    Complications: No apparent anesthesia complications

## 2015-06-18 NOTE — Progress Notes (Addendum)
PROGRESS NOTE    Jasmine Newman  ONG:[id number] DOB: September 30, 1931 DOA: 06/01/2015  PCP: No primary care provider on file.   Brief Narrative:  82 year old with past mental history of hypothyroidism, hypertension, hyperlipidemia, depression admitted after she was found in a pool of melanotic stool where she had been laying all night. In the ED she was found to be anemic with hemoglobin of 7.4 (baseline unknown). She continued to be hypotensive despite of 2 L of fluid. GI evaluated the patient. PCCM admitted the patient and intubated her.    Assessment & Plan:   Principal Problem:   Gastrointestinal hemorrhage associated with duodenal ulcer -EGD performed on 06/02/2015 revealed a large 3 cm posterior wall duodenal ulcer that had a large adherent clot. Initially injected with epinephrine. She continued to have upper GI bleed, interventional radiology consulted undergoing coil embolization on 06/05/2015. -She re-bled for which general surgery was consulted and evaluated her on 06/06/2015. She was taken to the operating room on 06/06/2015 where she underwent exploratory laparotomy with oversewing of duodenal ulcer. - cont PPI  Active Problems:   Anemia - initially due to acute blood loss and more recently likely due to anemia of acute inflammation - cont to transfuse as needed    Hypovolemic shock - due to GI bleed- resolved    Enterococcal Bacteremia/ sepsis/ endocarditis/ UTI - -Blood cultures and U culture obtained on 06/01/2015 and 06/04/2015 growing enterococcus species -Transesophageal echocardiogram had been planned for 06/12/2015, however, patient declined procedure.-she agreed to it later  - TEE 4/19- showed large vegetation in outflow tract- spoke with Dr Cyndia Bent from [place] [facility]- he agrees that she is a not candidate for surgery - cont antibiotics per ID- Dr Linus Salmons recommends ampicillin and Rocephin for 6 wks - ordered PICC line    NSTEMI (non-ST elevated myocardial infarction)    - type 2 due to stress   Pulm edema/ volume overloaded - treated with IV lasix which was stopped on 4/18- follow  Left heel ulcer - Prevalon boots- wound care evaluated her  Bedbound  - no longer able to transfer from bed to chair- has a foley    DVT prophylaxis: SCDs due to GI bleed Code Status: DNR Family Communication:  Husband at bedside Disposition Plan: patient wishes to go home but it is doubtful that her husband can do IV antibiotics esp QID Ampicillin- she is also completely bed bound now- will discuss going to rehab   Consultants:   PCCM  CCS  ID   Procedures:   TTE, TEE  Intubation   Antimicrobials:  Anti-infectives    Start     Dose/Rate Route Frequency Ordered Stop   06/18/15 1800  cefTRIAXone (ROCEPHIN) 2 g in dextrose 5 % 50 mL IVPB     2 g 100 mL/hr over 30 Minutes Intravenous Every 12 hours 06/18/15 1607     06/06/15 1400  ampicillin (OMNIPEN) 2 g in sodium chloride 0.9 % 50 mL IVPB     2 g 150 mL/hr over 20 Minutes Intravenous Every 6 hours 06/06/15 0959     06/04/15 1000  vancomycin (VANCOCIN) 1,250 mg in sodium chloride 0.9 % 250 mL IVPB  Status:  Discontinued     1,250 mg 166.7 mL/hr over 90 Minutes Intravenous Every 24 hours 06/04/15 0901 06/04/15 0928   06/04/15 1000  ampicillin (OMNIPEN) 2 g in sodium chloride 0.9 % 50 mL IVPB  Status:  Discontinued     2 g 150 mL/hr over 20 Minutes Intravenous 6  times per day 06/04/15 0929 06/06/15 0959   06/02/15 0630  cefTRIAXone (ROCEPHIN) 2 g in dextrose 5 % 50 mL IVPB  Status:  Discontinued     2 g 100 mL/hr over 30 Minutes Intravenous Daily 06/02/15 0613 06/04/15 0928   06/02/15 0630  vancomycin (VANCOCIN) IVPB 1000 mg/200 mL premix  Status:  Discontinued     1,000 mg 200 mL/hr over 60 Minutes Intravenous Daily 06/02/15 0619 06/04/15 0901   06/02/15 0600  ampicillin-sulbactam (UNASYN) 1.5 g in sodium chloride 0.9 % 50 mL IVPB  Status:  Discontinued     1.5 g 100 mL/hr over 30 Minutes  Intravenous Every 12 hours 06/01/15 1555 06/02/15 0613   06/01/15 1700  ampicillin-sulbactam (UNASYN) 1.5 g in sodium chloride 0.9 % 50 mL IVPB     1.5 g 100 mL/hr over 30 Minutes Intravenous  Once 06/01/15 1554 06/01/15 1726      Subjective: Complains of pain in her legs. No complaints of nausea, vomiting, dyspnea or cough.   Objective: Filed Vitals:   06/18/15 1520 06/18/15 1525 06/18/15 1535 06/18/15 1608  BP: 114/77 135/59    Pulse: 81 79  78  Temp:    98 F (36.7 C)  TempSrc:    Oral  Resp: 21 14  18   Height:      Weight:      SpO2: 97% 97% 97% 98%    Intake/Output Summary (Last 24 hours) at 06/18/15 1755 Last data filed at 06/18/15 1527  Gross per 24 hour  Intake 1415.5 ml  Output   2600 ml  Net -1184.5 ml   Filed Weights   06/17/15 0507 06/17/15 2122 06/18/15 0417  Weight: 100.2 kg (220 lb 14.4 oz) 101.334 kg (223 lb 6.4 oz) 101.334 kg (223 lb 6.4 oz)    Examination: General exam: Appears calm and comfortable - very hard of hearing- confused to situation- states she is in the hospital due to her leg pain Respiratory system: Clear to auscultation. Respiratory effort normal. Cardiovascular system: S1 & S2 heard, RRR. 2/6 murmur at 2nd right intercostal space Gastrointestinal system: Abdomen is nondistended, soft and nontender. No organomegaly or masses felt. Normal bowel sounds heard. Central nervous system: Alert and oriented. No focal neurological deficits. Extremities: Symmetric 5 x 5 power. Skin: No rashes, lesions or ulcers Psychiatry:  Mood & affect appropriate.     Data Reviewed: I have personally reviewed following labs and imaging studies  CBC:  Recent Labs Lab 06/14/15 0445 06/15/15 0514 06/16/15 0400 06/17/15 0403 06/18/15 0510  WBC 8.4 8.2 6.0 6.3 5.8  NEUTROABS 6.1 5.9 4.0 4.0 3.6  HGB 8.0* 7.7* 8.4* 7.9* 8.5*  HCT 26.0* 24.9* 27.1* 25.3* 27.2*  MCV 95.2 93.3 92.8 93.4 91.6  PLT 148* 173 205 220 161   Basic Metabolic Panel:  Recent  Labs Lab 06/14/15 0445 06/15/15 0514 06/16/15 0400 06/17/15 0403 06/18/15 0510  NA 143 142 143 141 139  K 3.7 3.6 3.5 3.1* 3.9  CL 98* 96* 93* 93* 95*  CO2 37* 38* 38* 40* 36*  GLUCOSE 125* 147* 108* 89 93  BUN 31* 28* 22* 19 18  CREATININE 1.34* 1.37* 1.31* 1.48* 1.48*  CALCIUM 8.1* 8.1* 8.5* 8.1* 8.1*  MG 1.8 2.0 2.0 1.9 2.0  PHOS 3.4 3.8 3.9 3.7 3.6   GFR: Estimated Creatinine Clearance: 35.2 mL/min (by C-G formula based on Cr of 1.48). Liver Function Tests:  Recent Labs Lab 06/12/15 0330  06/14/15 0445 06/15/15 0514 06/16/15 0400 06/17/15 0403  06/18/15 0510  AST 34  --   --   --   --   --   --   ALT 21  --   --   --   --   --   --   ALKPHOS 86  --   --   --   --   --   --   BILITOT 0.6  --   --   --   --   --   --   PROT 5.2*  --   --   --   --   --   --   ALBUMIN 1.9*  < > 1.7* 1.7* 1.7* 1.6* 1.6*  < > = values in this interval not displayed. No results for input(s): LIPASE, AMYLASE in the last 168 hours. No results for input(s): AMMONIA in the last 168 hours. Coagulation Profile:  Recent Labs Lab 06/14/15 0445 06/15/15 0514 06/16/15 0400 06/17/15 0403 06/18/15 0510  INR 0.91 0.96 0.97 1.07 1.16   Cardiac Enzymes: No results for input(s): CKTOTAL, CKMB, CKMBINDEX, TROPONINI in the last 168 hours. BNP (last 3 results) No results for input(s): PROBNP in the last 8760 hours. HbA1C: No results for input(s): HGBA1C in the last 72 hours. CBG:  Recent Labs Lab 06/13/15 2327 06/14/15 0737 06/14/15 1522 06/14/15 2241 06/15/15 0637  GLUCAP 112* 128* 111* 141* 131*   Lipid Profile: No results for input(s): CHOL, HDL, LDLCALC, TRIG, CHOLHDL, LDLDIRECT in the last 72 hours. Thyroid Function Tests: No results for input(s): TSH, T4TOTAL, FREET4, T3FREE, THYROIDAB in the last 72 hours. Anemia Panel: No results for input(s): VITAMINB12, FOLATE, FERRITIN, TIBC, IRON, RETICCTPCT in the last 72 hours. Urine analysis:    Component Value Date/Time    COLORURINE YELLOW 06/01/2015 1137   APPEARANCEUR CLOUDY* 06/01/2015 1137   LABSPEC 1.016 06/01/2015 1137   PHURINE 5.0 06/01/2015 1137   GLUCOSEU NEGATIVE 06/01/2015 1137   HGBUR MODERATE* 06/01/2015 1137   BILIRUBINUR NEGATIVE 06/01/2015 1137   KETONESUR NEGATIVE 06/01/2015 1137   PROTEINUR 30* 06/01/2015 1137   NITRITE NEGATIVE 06/01/2015 1137   LEUKOCYTESUR TRACE* 06/01/2015 1137   Sepsis Labs: @LABRCNTIP (procalcitonin:4,lacticidven:4)  ) Recent Results (from the past 240 hour(s))  Culture, blood (routine x 2)     Status: None   Collection Time: 06/10/15  1:30 AM  Result Value Ref Range Status   Specimen Description BLOOD BLOOD RIGHT WRIST  Final   Special Requests IN PEDIATRIC BOTTLE [place]  Final   Culture   Final    NO GROWTH 5 DAYS Performed at [facility]    Report Status 06/15/2015 FINAL  Final  Culture, blood (routine x 2)     Status: None   Collection Time: 06/10/15  1:35 AM  Result Value Ref Range Status   Specimen Description BLOOD RIGHT HAND  Final   Special Requests BOTTLES DRAWN AEROBIC AND ANAEROBIC 5CC  Final   Culture   Final    NO GROWTH 5 DAYS Performed at [facility]    Report Status 06/15/2015 FINAL  Final         Radiology Studies: No results found.      Scheduled Meds: . sodium chloride   Intravenous Once  . ampicillin (OMNIPEN) IV  2 g Intravenous Q6H  . antiseptic oral rinse  7 mL Mouth Rinse QID  . cefTRIAXone (ROCEPHIN)  IV  2 g Intravenous Q12H  . chlorhexidine gluconate (SAGE KIT)  15 mL Mouth Rinse BID  . feeding supplement  1 Container Oral TID BM  . levothyroxine  50 mcg Oral QAC breakfast  . pantoprazole (PROTONIX) IV  40 mg Intravenous Q12H  . sodium chloride flush  10-40 mL Intracatheter Q12H   Continuous Infusions: . sodium chloride 75 mL/hr at 06/18/15 0529     LOS: 17 days    Time spent in minutes: 67    [place], MD [facility] Pager: [email or web address] Password [facility] 06/18/2015,  5:55 PM

## 2015-06-18 NOTE — Interval H&P Note (Signed)
History and Physical Interval Note:  06/18/2015 1:53 PM  Jasmine Newman  has presented today for surgery, with the diagnosis of bacteremia  The various methods of treatment have been discussed with the patient and family. After consideration of risks, benefits and other options for treatment, the patient has consented to  Procedure(s): TRANSESOPHAGEAL ECHOCARDIOGRAM (TEE) (N/A) as a surgical intervention .  The patient's history has been reviewed, patient examined, no change in status, stable for surgery.  I have reviewed the patient's chart and labs.  Questions were answered to the patient's satisfaction.     Charlton HawsPeter Symphony Demuro

## 2015-06-18 NOTE — H&P (View-Only) (Signed)
[facility] PROGRESS NOTE  Sonakshi G Ballantine MRN:[id number] DOB: 03/28/1931 DOA: 06/01/2015 PCP: No primary care provider on file.  Interim summary/brief H&P 82-year-old with past mental history of hypothyroidism, hypertension, hyperlipidemia, depression. Admitted today to the ED after she had a fall at home. She is watching the [employer] basketball game with her husband. When she stood up to use the bathroom her legs gave out and she fell on the right side. She hurt her leg and chest. She did not hit her head and there was no loss of consciousness. She stayed on the floor the whole night. When her son arrived next morning she was found to be lying in a large pool of melanotic stool. In the ED she was found to be hypertensive with hemoglobin of 7.4 (baseline unknown). She continued to be hypotensive despite of 2 L of fluid. GI evaluated the patient. PCCM called for admission. Patient ended experiencing need for intubation and pressors; initially failed  IR approach for embolization and subsequent required surgery. Hemodynamically stable and with good O2 sat currently. Course complicated with enterococcus bacteremia and either NSTEMI vs demand ischemia. Infectious disease recommended TEE to rule out endocarditis. ID, CCS and cardiology were consulted. Early on she refused transesophageal echocardiogram as infectious disease recommended treating with 28 days of ampicillin from 06/10/2015. Now she has agreed to undergo procedure signing consent on 06/17/2015.  Plan for her to undergo transesophageal echocardiogram on 06/18/2015.   Regards to disposition she was seen and evaluated by physical therapy who recommended skilled nursing facility placement. I spoke to Mr and Mrs Brand regarding placement to skilled nursing facility. Mrs Sarracino continues to be adamant about going home, when asked how her husband would me her needs, she replied "well he's done it in the past and will figure it out." I  explained that she had significant physical deconditioning and having increase in her healthcare needs, situation where her husband may not be able to provide the care she needs.  Mr Calvillo told me that he would speak with their son about this and have a family meeting recognizing that he may not be able to care for her.     Assessment/Plan: 1 Respiratory failure with hypoxia: required intubation until 06/08/15 -Suspect secondary to multiple factors including pulmonary edema -Continues to have extensive bilateral crackles, a. Volume overload on physical examination. -Chest x-ray on 06/07/2015 had revealed interstitial and airspace opacities that could be consistent with edema. -Repeat chest x-ray on 06/11/2015 pending -On 06/11/2015 given ongoing evidence of volume overload including having extensive crackles on physical examination will increase Lasix to 40 mg IV twice a day -On my assessment on 06/12/2015 she appears improved with decrease in crackles. She had urine output of 6.4 L Kidney function stable having creatinine 1.13 with BUN of 20.Transition to Lasix 40 mg by mouth twice daily on this date. -On 06/15/2015 she is stable from respiratory standpoint.  -On 06/17/2015 have an upward trend and creatinine 1.48 for which I will stop Lasix  2 Shock: sepsis vs secondary to upper GI bleed -resolved -BP stable -She received aggressive IV fluid resuscitation initially -Showing overall clinical improvement. Required IV Lasix for volume overload -Plan for transesophageal echocardiogram on 06/18/2015, patient now agreeable and has signed consent.  3 Aortic stenosis: s/p bioprosthetic valve -Transesophageal echocardiogram planned for 06/12/2015, having blood cultures growing enterococcus from 06/01/2015 and 06/04/2015 -keep good BP control  4 Suspect demand ischemia -She presented with septic shock, GI bleed requiring pressor support initially -2-D   echo with wall motion  abnormalities -cardiology following. Not on antiplatelet therapy due to GI bleed  5.  Upper GI bleed secondary to large duodenal ulcer -She presented with melanotic stools, EGD performed on 06/02/2015 revealed a large 3 cm posterior wall duodenal ulcer that had a large adherent clot. Initially injected with epinephrine. She continued to have upper GI bleed, interventional radiology consulted undergoing coil embolization on 06/05/2015. -She re-bled for which general surgery was consulted and evaluated her on 06/06/2015. She was taken to the operating room on 06/06/2015 where she underwent exploratory laparotomy with oversewing of duodenal ulcer. -She has required blood transfusions during this hospitalization -Lab work on 06/11/2015 show a hemoglobin of 8.3 with hematocrit of 24.9 -continue PPI -On 06/15/2015 hemoglobin trended down to 7.7 for which she was transfused 1 unit of packed red blood cells. On this date spoke with general surgery who agree with the discontinuation of TNA as she was tolerating soft diet. -Transfusing 1 unit of packed red blood cells on 06/17/2015 have a hemoglobin of 7.9  6 Acute kidney injury: due to shock episodes and decrease perfusion -On 06/14/2015 labs showing creatinine of 1.34. -Having an upward trend in her creatinine will decrease her Lasix dose to 40 mg by mouth daily (previously and 40 mg by mouth twice a day) -On 06/16/2015 Lasix decreased from 40 mg by mouth daily to 20 g by mouth daily -On 06/17/2015 lab work showing upward trend in creatinine to 1.48. Lasix stop. She'll be made nothing by mouth after midnight for transesophageal echocardiogram tomorrow, will provide gentle IV fluid hydration with normal saline running at 75 mL/hour  7 Acute blood loss anemia -Secondary to upper GI bleed in setting of large duodenal ulcer -transfusion as needed; goal is for Hgb > 8 -follow Hgb and platelets trend -Hemoglobin trended down to 7.9 on 06/17/2015. Does not  have clinical evidence of GI bleed. She'll be transfused 1 unit of packed red blood cells on this date.  8-Sepsis: secondary to enterococcus bacteremia -Present on admission  -Blood cultures obtained on 06/01/2015 and 06/04/2015 growing enterococcus species -Transesophageal echocardiogram had been planned for 06/12/2015, however, patient declined procedure. -continue ampicillin; ID on board -Dr Comer recommending prolonged course of ampicillin IV for 28 days total from 06/10/2015 if no transesophageal echocardiogram could be performed. -On 06/17/2015 patient now agreeable to undergo procedure. Transesophageal echocardiogram has been planned for 06/18/2015.  9 Left heel ulcer: present prior to admission -follow [facility] recommendations -continue prevlon boots and preventive measurements   10. Hypokalemia -A.m. labs showing potassium of 3.1, likely resulting from diuretic therapy -Will replace with kdur 40 meq PO x 2  Code Status: Full Family Communication: I spoke to her husband on 06/16/2015 Disposition Plan: Plan for transesophageal echocardiogram on 06/18/2015  Consultants:  PCCM  CCS  ID  Cardiology (for TEE)  Procedures/studies: X ray rib 4/2: No fractures Port CXR 4/2: Questionable right basal hazy opacity. R IJ CVL in mid-SVC. Chronic elevation left hemidiaphragm.  R Knee 2View 4/2: No fracture or dislocation. Port CXR 4/4: Silhouetting of left hemidiaphragm unchanged. Endotracheal tube and central venous catheter in good position. No new focal opacity. TTE 4/4: LV normal in size with EF 45-50%. Akinesis of anterior septal myocardium. Grade 2 diastolic dysfunction. LA & RA normal in size. RV normal in size and function. PASP 40mmHg. Moderate aortic stenosis w/ bioprosthetic aortic valve without regurgitation. Severe mitral vegetation without stenosis. Trivial pulmonic regurgitation without stenosis. Moderate tricuspid regurgitation. Port CXR 4/8: Endotracheal tube   and  central line in good position. Low lung volumes. Hazy opacification bilaterally.  Antibiotics:  Ampicillin   HPI/Subjective: She continues to make progress, looks better overall  Objective: Filed Vitals:   06/17/15 0507 06/17/15 1438  BP: 104/55 105/47  Pulse: 78 80  Temp: 98 F (36.7 C) 98 F (36.7 C)  Resp: 16 16    Intake/Output Summary (Last 24 hours) at 06/17/15 1659 Last data filed at 06/17/15 1500  Gross per 24 hour  Intake 1006.33 ml  Output   1575 ml  Net -568.67 ml   Filed Weights   06/14/15 1000 06/15/15 0508 06/17/15 0507  Weight: 105.2 kg (231 lb 14.8 oz) 103.6 kg (228 lb 6.3 oz) 100.2 kg (220 lb 14.4 oz)    Exam:   General: Nontoxic appearing, awake and alert, hard of hearing  Cardiovascular: rate controlled currently, no rubs or gallops  Respiratory: Improvement to crackles, no wheezing or rales  Abdomen: soft, positive BS, drain in place, mild tenderness with palpation; no guarding   Musculoskeletal: Improved edema and left heel ulcer appreciated; no cyanosis; SCD's in place  Data Reviewed: Basic Metabolic Panel:  Recent Labs Lab 06/13/15 0510 06/14/15 0445 06/15/15 0514 06/16/15 0400 06/17/15 0403  NA 143 143 142 143 141  K 3.8 3.7 3.6 3.5 3.1*  CL 101 98* 96* 93* 93*  CO2 38* 37* 38* 38* 40*  GLUCOSE 148* 125* 147* 108* 89  BUN 27* 31* 28* 22* 19  CREATININE 1.16* 1.34* 1.37* 1.31* 1.48*  CALCIUM 7.9* 8.1* 8.1* 8.5* 8.1*  MG 2.0 1.8 2.0 2.0 1.9  PHOS 3.1 3.4 3.8 3.9 3.7   Liver Function Tests:  Recent Labs Lab 06/12/15 0330 06/13/15 0510 06/14/15 0445 06/15/15 0514 06/16/15 0400 06/17/15 0403  AST 34  --   --   --   --   --   ALT 21  --   --   --   --   --   ALKPHOS 86  --   --   --   --   --   BILITOT 0.6  --   --   --   --   --   PROT 5.2*  --   --   --   --   --   ALBUMIN 1.9* 1.7* 1.7* 1.7* 1.7* 1.6*   CBC:  Recent Labs Lab 06/13/15 0510 06/14/15 0445 06/15/15 0514 06/16/15 0400 06/17/15 0403  WBC 8.9  8.4 8.2 6.0 6.3  NEUTROABS 6.6 6.1 5.9 4.0 4.0  HGB 8.2* 8.0* 7.7* 8.4* 7.9*  HCT 25.9* 26.0* 24.9* 27.1* 25.3*  MCV 94.9 95.2 93.3 92.8 93.4  PLT 129* 148* 173 205 220   Cardiac Enzymes: No results for input(s): CKTOTAL, CKMB, CKMBINDEX, TROPONINI in the last 168 hours. BNP (last 3 results)  Recent Labs  06/01/15 2100  BNP 329.7*   CBG:  Recent Labs Lab 06/13/15 2327 06/14/15 0737 06/14/15 1522 06/14/15 2241 06/15/15 0637  GLUCAP 112* 128* 111* 141* 131*    Recent Results (from the past 240 hour(s))  Culture, blood (routine x 2)     Status: None   Collection Time: 06/10/15  1:30 AM  Result Value Ref Range Status   Specimen Description BLOOD BLOOD RIGHT WRIST  Final   Special Requests IN PEDIATRIC BOTTLE 1CC  Final   Culture   Final    NO GROWTH 5 DAYS Performed at  Hospital    Report Status 06/15/2015 FINAL  Final  Culture, blood (routine   x 2)     Status: None   Collection Time: 06/10/15  1:35 AM  Result Value Ref Range Status   Specimen Description BLOOD RIGHT HAND  Final   Special Requests BOTTLES DRAWN AEROBIC AND ANAEROBIC 5CC  Final   Culture   Final    NO GROWTH 5 DAYS Performed at [facility]    Report Status 06/15/2015 FINAL  Final     Studies: No results found.  Scheduled Meds: . ampicillin (OMNIPEN) IV  2 g Intravenous Q6H  . antiseptic oral rinse  7 mL Mouth Rinse QID  . chlorhexidine gluconate (SAGE KIT)  15 mL Mouth Rinse BID  . feeding supplement  1 Container Oral TID BM  . furosemide  20 mg Oral Daily  . levothyroxine  50 mcg Oral QAC breakfast  . pantoprazole (PROTONIX) IV  40 mg Intravenous Q12H  . sodium chloride flush  10-40 mL Intracatheter Q12H   Continuous Infusions: . dextrose 10 mL (06/17/15 0956)    Active Problems:   GI bleed   Pressure ulcer   Gastrointestinal hemorrhage associated with duodenal ulcer   Hypovolemic shock (HCC)   Bacteremia   NSTEMI (non-ST elevated myocardial infarction) (HCC)    Malnutrition of moderate degree   Anemia due to acute blood loss   Time spent: 25 minutes (> 50% dedicated to face to face examination, coordination of care and intercommunication with consultants)   Cyenna Rebello  [facility] Pager [phone]. If 7PM-7AM, please contact night-coverage at [email or web address], password [email or web address] 06/17/2015, 4:59 PM  LOS: 16 days              

## 2015-06-18 NOTE — Progress Notes (Signed)
    [facility]   Reason for visit: Follow up on Enterococcal bacteremia  Interval History: just had her TEE and a large vegetation noted on LVOT.    Physical Exam: Constitutional:  Filed Vitals:   06/18/15 1520 06/18/15 1525  BP: 114/77 135/59  Pulse: 81 79  Temp:    Resp: 21 14   patient appears in NAD  Impression: Enterococcal prosthetic valve endocarditis  Plan: 1.  I will add ceftriaxone to her ampicillin for dual coverage 2.  She will need a prolonged course 3.  She will need formal consult with cardiology (if not already officially involved) and possibly with CVTS

## 2015-06-18 NOTE — Progress Notes (Signed)
  Echocardiogram Echocardiogram Transesophageal has been performed.  Leta JunglingCooper, Aubreanna Percle M 06/18/2015, 3:21 PM

## 2015-06-18 NOTE — Anesthesia Postprocedure Evaluation (Signed)
Anesthesia Post Note  Patient: Jasmine Newman  Procedure(s) Performed: Procedure(s) (LRB): TRANSESOPHAGEAL ECHOCARDIOGRAM (TEE) (N/A)  Patient location during evaluation: Endoscopy Anesthesia Type: MAC Level of consciousness: sedated Pain management: pain level controlled Vital Signs Assessment: post-procedure vital signs reviewed and stable Respiratory status: spontaneous breathing and respiratory function stable Cardiovascular status: blood pressure returned to baseline and stable Anesthetic complications: no    Last Vitals:  Filed Vitals:   06/18/15 0517 06/18/15 1333  BP: 106/48 134/55  Pulse: 79 75  Temp: 36.6 C 36.4 C  Resp: 18 16    Last Pain:  Filed Vitals:   06/18/15 1509  PainSc: 4                  Franciscojavier Wronski EDWARD

## 2015-06-18 NOTE — Anesthesia Preprocedure Evaluation (Addendum)
Anesthesia Evaluation  Patient identified by MRN, date of birth, ID band Patient awake and Patient confused    Reviewed: Allergy & Precautions, NPO status , Patient's Chart, lab work & pertinent test results, Unable to perform ROS - Chart review only  History of Anesthesia Complications (+) AWARENESS UNDER ANESTHESIA  Airway Mallampati: I  TM Distance: >3 FB Neck ROM: Limited  Mouth opening: Limited Mouth Opening  Dental   Pulmonary COPD, former smoker,    Pulmonary exam normal        Cardiovascular hypertension, + Past MI  Normal cardiovascular exam     Neuro/Psych    GI/Hepatic PUD,   Endo/Other    Renal/GU Renal disease     Musculoskeletal   Abdominal   Peds  Hematology  (+) anemia ,   Anesthesia Other Findings   Reproductive/Obstetrics                             Anesthesia Physical Anesthesia Plan  ASA: III and emergent  Anesthesia Plan: MAC   Post-op Pain Management:    Induction: Intravenous  Airway Management Planned: Mask  Additional Equipment:   Intra-op Plan:   Post-operative Plan:   Informed Consent: I have reviewed the patients History and Physical, chart, labs and discussed the procedure including the risks, benefits and alternatives for the proposed anesthesia with the patient or authorized representative who has indicated his/her understanding and acceptance.     Plan Discussed with: CRNA, Anesthesiologist and Surgeon  Anesthesia Plan Comments:        Anesthesia Quick Evaluation

## 2015-06-18 NOTE — Transfer of Care (Signed)
Immediate Anesthesia Transfer of Care Note  Patient: Jasmine Newman  Procedure(s) Performed: Procedure(s): TRANSESOPHAGEAL ECHOCARDIOGRAM (TEE) (N/A)  Patient Location: Endoscopy Unit  Anesthesia Type:MAC  Level of Consciousness: awake and alert   Airway & Oxygen Therapy: Patient Spontanous Breathing and Patient connected to nasal cannula oxygen  Post-op Assessment: Report given to RN and Post -op Vital signs reviewed and stable  Post vital signs: Reviewed and stable  Last Vitals:  Filed Vitals:   06/18/15 1333 06/18/15 1515  BP: 134/55   Pulse: 75 76  Temp: 36.4 C   Resp: 16 13    Complications: No apparent anesthesia complications

## 2015-06-18 NOTE — Progress Notes (Signed)
12 Days Post-Op  Subjective: No change, dressing over drain site needs changed, but otherwise seems to be doing well.   Objective: Vital signs in last 24 hours: Temp:  [97.9 F (36.6 C)-98.8 F (37.1 C)] 97.9 F (36.6 C) (04/19 0517) Pulse Rate:  [75-80] 79 (04/19 0517) Resp:  [16-18] 18 (04/19 0517) BP: (103-115)/(46-53) 106/48 mmHg (04/19 0517) SpO2:  [94 %-98 %] 96 % (04/19 0517) Weight:  [101.334 kg (223 lb 6.4 oz)] 101.334 kg (223 lb 6.4 oz) (04/19 0417) Last BM Date: 06/16/15 490 PO  Urine 2350 Drain pulled  Afebrile, VSS Labs stable Intake/Output from previous day: 04/18 0701 - 04/19 0700 In: 1665.5 [P.O.:490; I.V.:875; Blood:300.5] Out: 2415 [Urine:2350; Drains:65] Intake/Output this shift:    General appearance: alert, cooperative and no distress GI: soft, non-tender; bowel sounds normal; no masses,  no organomegaly and drain site and incision look fine.  Lab Results:   Recent Labs  06/17/15 0403 06/18/15 0510  WBC 6.3 5.8  HGB 7.9* 8.5*  HCT 25.3* 27.2*  PLT 220 244    BMET  Recent Labs  06/17/15 0403 06/18/15 0510  NA 141 139  K 3.1* 3.9  CL 93* 95*  CO2 40* 36*  GLUCOSE 89 93  BUN 19 18  CREATININE 1.48* 1.48*  CALCIUM 8.1* 8.1*   PT/INR  Recent Labs  06/17/15 0403 06/18/15 0510  LABPROT 14.1 14.5  INR 1.07 1.16     Recent Labs Lab 06/12/15 0330  06/14/15 0445 06/15/15 0514 06/16/15 0400 06/17/15 0403 06/18/15 0510  AST 34  --   --   --   --   --   --   ALT 21  --   --   --   --   --   --   ALKPHOS 86  --   --   --   --   --   --   BILITOT 0.6  --   --   --   --   --   --   PROT 5.2*  --   --   --   --   --   --   ALBUMIN 1.9*  < > 1.7* 1.7* 1.7* 1.6* 1.6*  < > = values in this interval not displayed.   Lipase     Component Value Date/Time   LIPASE 36 06/01/2015 2100     Studies/Results: No results found.  Medications: . sodium chloride   Intravenous Once  . ampicillin (OMNIPEN) IV  2 g Intravenous Q6H  .  antiseptic oral rinse  7 mL Mouth Rinse QID  . chlorhexidine gluconate (SAGE KIT)  15 mL Mouth Rinse BID  . feeding supplement  1 Container Oral TID BM  . levothyroxine  50 mcg Oral QAC breakfast  . pantoprazole (PROTONIX) IV  40 mg Intravenous Q12H  . sodium chloride flush  10-40 mL Intracatheter Q12H    Assessment/Plan Bleeding giant duodenal ulcer S/p Emergency exploratory laparotomy with oversewing of bleeding giant duodenal ulcer, 06/06/15, Dr. Jackolyn Confer Respiratory failure with hypoxia Shock:sepsis vs GI bleed Demand ischemia Acute kidney injury PCM Anemia/multiple transfusions: Last transfusion 06/15/15 Antibiotics: Ampicillin day 14 (bacteremia) DVT: SCD's    Plan:  From our standpoint she can go when medically stable.  I think she is to get ECHO cardiogram today.    LOS: 17 days    Jasmine Newman 06/18/2015 [phone]

## 2015-06-18 NOTE — CV Procedure (Signed)
TEE: Propofol Anesthesia Dr Katrinka BlazingSmith  Normal LV EF 60% Mild MR Tissue AVR.  Large vegetation on ventricular side of LVOT.  Mild to moderate resultant AR Normal TV/PV Intervalvular fibrosa thickened but no aneurysm or obvious abscess No effusion No PFO No pericardial effusion  Jasmine HawsPeter Ersilia Newman

## 2015-06-19 ENCOUNTER — Inpatient Hospital Stay (HOSPITAL_COMMUNITY): Payer: Medicare Other

## 2015-06-19 ENCOUNTER — Encounter (HOSPITAL_COMMUNITY): Payer: Self-pay | Admitting: Cardiovascular Disease

## 2015-06-19 DIAGNOSIS — I339 Acute and subacute endocarditis, unspecified: Secondary | ICD-10-CM

## 2015-06-19 DIAGNOSIS — B9689 Other specified bacterial agents as the cause of diseases classified elsewhere: Secondary | ICD-10-CM

## 2015-06-19 DIAGNOSIS — E440 Moderate protein-calorie malnutrition: Secondary | ICD-10-CM

## 2015-06-19 LAB — CBC WITH DIFFERENTIAL/PLATELET
BASOS PCT: 1 %
Basophils Absolute: 0 10*3/uL (ref 0.0–0.1)
Eosinophils Absolute: 0.2 10*3/uL (ref 0.0–0.7)
Eosinophils Relative: 4 %
HEMATOCRIT: 27.3 % — AB (ref 36.0–46.0)
HEMOGLOBIN: 8.4 g/dL — AB (ref 12.0–15.0)
Lymphocytes Relative: 16 %
Lymphs Abs: 1 10*3/uL (ref 0.7–4.0)
MCH: 27.9 pg (ref 26.0–34.0)
MCHC: 30.8 g/dL (ref 30.0–36.0)
MCV: 90.7 fL (ref 78.0–100.0)
MONOS PCT: 14 %
Monocytes Absolute: 0.9 10*3/uL (ref 0.1–1.0)
NEUTROS ABS: 4.2 10*3/uL (ref 1.7–7.7)
NEUTROS PCT: 65 %
Platelets: 269 10*3/uL (ref 150–400)
RBC: 3.01 MIL/uL — ABNORMAL LOW (ref 3.87–5.11)
RDW: 17.4 % — ABNORMAL HIGH (ref 11.5–15.5)
WBC: 6.3 10*3/uL (ref 4.0–10.5)

## 2015-06-19 LAB — FIBRINOGEN: Fibrinogen: 713 mg/dL — ABNORMAL HIGH (ref 204–475)

## 2015-06-19 LAB — RENAL FUNCTION PANEL
ALBUMIN: 1.7 g/dL — AB (ref 3.5–5.0)
Anion gap: 10 (ref 5–15)
BUN: 14 mg/dL (ref 6–20)
CALCIUM: 8.3 mg/dL — AB (ref 8.9–10.3)
CO2: 35 mmol/L — ABNORMAL HIGH (ref 22–32)
Chloride: 96 mmol/L — ABNORMAL LOW (ref 101–111)
Creatinine, Ser: 1.27 mg/dL — ABNORMAL HIGH (ref 0.44–1.00)
GFR calc Af Amer: 45 mL/min — ABNORMAL LOW (ref >60)
GFR calc non Af Amer: 38 mL/min — ABNORMAL LOW (ref >60)
GLUCOSE: 92 mg/dL (ref 65–99)
PHOSPHORUS: 3.9 mg/dL (ref 2.5–4.6)
POTASSIUM: 3.6 mmol/L (ref 3.5–5.1)
SODIUM: 141 mmol/L (ref 135–145)

## 2015-06-19 LAB — PROTIME-INR
INR: 1.13 (ref 0.00–1.49)
Prothrombin Time: 14.2 seconds (ref 11.6–15.2)

## 2015-06-19 LAB — APTT: APTT: 35 s (ref 24–37)

## 2015-06-19 LAB — MAGNESIUM: Magnesium: 2.1 mg/dL (ref 1.7–2.4)

## 2015-06-19 MED ORDER — LIDOCAINE HCL 1 % IJ SOLN
INTRAMUSCULAR | Status: DC | PRN
  Administered 2015-06-19: 10 mL

## 2015-06-19 MED ORDER — LIDOCAINE HCL 1 % IJ SOLN
INTRAMUSCULAR | Status: AC
  Filled 2015-06-19: qty 20

## 2015-06-19 NOTE — Progress Notes (Signed)
    [facility]   Reason for visit: Follow up on endocarditis  Interval History: TEE with vegetation, not felt to be a surgical candidate by Dr. Laneta SimmersBartle, appreciate his input.  No fever, no chills.   Physical Exam: Constitutional:  Filed Vitals:   06/18/15 2109 06/19/15 0455  BP: 138/78 135/75  Pulse: 78 80  Temp: 97.8 F (36.6 C) 98 F (36.7 C)  Resp: 17 16   patient appears in NAD Respiratory: Normal respiratory effort; CTA B Cardiovascular: RRR  Review of Systems: Constitutional: negative for fatigue and malaise Gastrointestinal: negative for diarrhea  Lab Results  Component Value Date   WBC 6.3 06/19/2015   HGB 8.4* 06/19/2015   HCT 27.3* 06/19/2015   MCV 90.7 06/19/2015   PLT 269 06/19/2015    Lab Results  Component Value Date   CREATININE 1.27* 06/19/2015   BUN 14 06/19/2015   NA 141 06/19/2015   K 3.6 06/19/2015   CL 96* 06/19/2015   CO2 35* 06/19/2015    Lab Results  Component Value Date   ALT 21 06/12/2015   AST 34 06/12/2015   ALKPHOS 86 06/12/2015     Microbiology: Recent Results (from the past 240 hour(s))  Culture, blood (routine x 2)     Status: None   Collection Time: 06/10/15  1:30 AM  Result Value Ref Range Status   Specimen Description BLOOD BLOOD RIGHT WRIST  Final   Special Requests IN PEDIATRIC BOTTLE 1CC  Final   Culture   Final    NO GROWTH 5 DAYS Performed at [facility]    Report Status 06/15/2015 FINAL  Final  Culture, blood (routine x 2)     Status: None   Collection Time: 06/10/15  1:35 AM  Result Value Ref Range Status   Specimen Description BLOOD RIGHT HAND  Final   Special Requests BOTTLES DRAWN AEROBIC AND ANAEROBIC 5CC  Final   Culture   Final    NO GROWTH 5 DAYS Performed at [facility]    Report Status 06/15/2015 FINAL  Final    Impression/Plan:  1. 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  Will need to continue ampicillin and ceftriaxone through May 22nd.   She  should have cardiology follow up and a repeat echo prior to stopping therapy  2. GI hemorrhage - s/p oversewing of duodenal ulcer.   We will arrange follow up in our clinic in about 4-5 weeks.

## 2015-06-19 NOTE — Procedures (Signed)
US/fluoroscopic guided right DL basilic vein PICC placed. Length 40 cm. Tip SVC/RA junction. No immediate complications.

## 2015-06-19 NOTE — NC FL2 (Signed)
[place] MEDICAID FL2 LEVEL OF CARE SCREENING TOOL     IDENTIFICATION  Patient Name: Jasmine Newman Birthdate: Mar 19, 1931 Sex: female Admission Date (Current Location): 06/01/2015  [facility] and [place] Number:  Herbalist and Address:  [facility],  [place] [address], [place]      Provider Number: [id number]  Attending Physician Name and Address:  Debbe Odea, MD  Relative Name and Phone Number:       Current Level of Care: Hospital Recommended Level of Care: [place] Prior Approval Number:    Date Approved/Denied:   PASRR Number: [id number]  Discharge Plan: Home    Current Diagnoses: Patient Active Problem List   Diagnosis Date Noted  . Acute endocarditis   . Anemia due to acute blood loss   . Malnutrition of moderate degree 06/10/2015  . Gastrointestinal hemorrhage associated with duodenal ulcer   . Hypovolemic shock ([place])   . Bacteremia   . NSTEMI (non-ST elevated myocardial infarction) ([place])   . Pressure ulcer 06/01/2015  . AKI (acute kidney injury) ([place])   . Dehydration   . Fall   . Gastrointestinal hemorrhage with melena     Orientation RESPIRATION BLADDER Height & Weight     Self, Time, Situation, Place  Normal Continent Weight: 224 lb (101.606 kg) Height:  _0  (165.1 cm)  BEHAVIORAL SYMPTOMS/MOOD NEUROLOGICAL BOWEL NUTRITION STATUS      Incontinent Diet (Soft Diet)  AMBULATORY STATUS COMMUNICATION OF NEEDS Skin   Extensive Assist Verbally PU Stage and Appropriate Care (PressureUlcer04/02/17StageIII-Fullthicknesstissueloss.Subcutaneousfatmaybevisiblebutbone,tendonormuscleareNOTexposed. & Incision(Closed)04/07/17Abdomen)                       Personal Care Assistance Level of Assistance  Bathing, Dressing Bathing Assistance: Limited assistance   Dressing Assistance: Limited assistance     Functional Limitations Info             SPECIAL CARE FACTORS  FREQUENCY  PT (By licensed PT), OT (By licensed OT)     PT Frequency: 5 OT Frequency: 5            Contractures      Additional Factors Info  Code Status, Allergies Code Status Info: DNR Allergies Info: NKDA           Current Medications (06/19/2015):  This is the current hospital active medication list Current Facility-Administered Medications  Medication Dose Route Frequency Provider Last Rate Last Dose  . 0.9 %  sodium chloride infusion   Intravenous Once Kelvin Cellar, MD      . ampicillin (OMNIPEN) 2 g in sodium chloride 0.9 % 50 mL IVPB  2 g Intravenous Q6H Donita Brooks, NP   2 g at 06/19/15 0803  . antiseptic oral rinse solution (CORINZ)  7 mL Mouth Rinse QID Praveen Mannam, MD   7 mL at 06/19/15 0400  . cefTRIAXone (ROCEPHIN) 2 g in dextrose 5 % 50 mL IVPB  2 g Intravenous Q12H Thayer Headings, MD   2 g at 06/19/15 0532  . chlorhexidine gluconate (SAGE KIT) (PERIDEX) 0.12 % solution 15 mL  15 mL Mouth Rinse BID Praveen Mannam, MD   15 mL at 06/18/15 2000  . feeding supplement (BOOST / RESOURCE BREEZE) liquid 1 Container  1 Container Oral TID BM Kelvin Cellar, MD   1 Container at 06/18/15 2000  . ipratropium-albuterol (DUONEB) 0.5-2.5 (3) MG/3ML nebulizer solution 3 mL  3 mL Nebulization Q6H PRN Barton Dubois, MD   3 mL at  06/15/15 0132  . levothyroxine (SYNTHROID, LEVOTHROID) tablet 50 mcg  50 mcg Oral QAC breakfast Kelvin Cellar, MD   50 mcg at 06/19/15 0804  . lidocaine (XYLOCAINE) 1 % (with pres) injection    PRN Greggory Keen, MD   10 mL at 06/05/15 0950  . lip balm (CARMEX) ointment   Topical PRN Marshell Garfinkel, MD   1 application at 96/72/89 1248  . morphine 2 MG/ML injection 1 mg  1 mg Intravenous Q4H PRN Kelvin Cellar, MD   1 mg at 06/18/15 0913  . ondansetron (ZOFRAN) injection 4 mg  4 mg Intravenous Q4H PRN Javier Glazier, MD   4 mg at 06/15/15 0254  . oxyCODONE (Oxy IR/ROXICODONE) immediate release tablet 5 mg  5 mg Oral Q6H PRN Kelvin Cellar, MD    5 mg at 06/19/15 0533  . pantoprazole (PROTONIX) injection 40 mg  40 mg Intravenous Q12H Kelvin Cellar, MD   40 mg at 06/18/15 2130  . sodium chloride flush (NS) 0.9 % injection 10-40 mL  10-40 mL Intracatheter Q12H Kelvin Cellar, MD   20 mL at 06/18/15 2200  . sodium chloride flush (NS) 0.9 % injection 10-40 mL  10-40 mL Intracatheter PRN Kelvin Cellar, MD   10 mL at 06/18/15 1706  . traMADol (ULTRAM) tablet 50 mg  50 mg Oral Q6H PRN Dianne Dun, NP   50 mg at 06/18/15 2215     Discharge Medications: Please see discharge summary for a list of discharge medications.  Relevant Imaging Results:  Relevant Lab Results:   Additional Information SSN: [id number]  Standley Brooking, LCSW

## 2015-06-19 NOTE — Progress Notes (Signed)
Cardiothoracic Surgery  Called by Dr. Butler Denmarkizwan concerning this 82 year old patient who reportedly underwent prosthetic AVR in 2006 by Dr. Dorris FetchHendrickson for aortic stenosis. She was admitted on 4/2 with melena after a fall at home and spending the night on the floor. She was found to have a large duodenal ulcer and failed an attempt at embolization and required laparotomy with oversewing of the ulcer. She was intubated for a while and also had enterococcal sepsis with positive blood cultures on 2 occasions. TEE now shows a mobile vegetation on the ventricular surface of the aortic valve or LVOT with mild AI. She is certainly not a candidate for redo AVR at her age under these circumstances. I would continue IV antibiotics and hope that this resolves. She is a DNR.

## 2015-06-19 NOTE — Progress Notes (Signed)
1 Day Post-Op  Subjective: No change from our standpoint.  They are working out treatment for her endocarditis.   Objective: Vital signs in last 24 hours: Temp:  [97.6 F (36.4 C)-98 F (36.7 C)] 98 F (36.7 C) (04/20 0455) Pulse Rate:  [75-81] 80 (04/20 0455) Resp:  [13-21] 16 (04/20 0455) BP: (114-138)/(55-78) 135/75 mmHg (04/20 0455) SpO2:  [97 %-100 %] 100 % (04/20 0455) Weight:  [101.606 kg (224 lb)] 101.606 kg (224 lb) (04/20 0455) Last BM Date: 06/18/15 + BM good urine output Soft diet, I don't know how much she is taking Afebrile, VSS Creatinine is better H/H is stable Intake/Output from previous day: 04/19 0701 - 04/20 0700 In: 20 [I.V.:20] Out: 2675 [Urine:2675] Intake/Output this shift: Total I/O In: 240 [P.O.:240] Out: 400 [Urine:400]  General appearance: alert, cooperative and no distress GI: soft, non-tender; bowel sounds normal; no masses,  no organomegaly  Lab Results:   Recent Labs  06/18/15 0510 06/19/15 0400  WBC 5.8 6.3  HGB 8.5* 8.4*  HCT 27.2* 27.3*  PLT 244 269    BMET  Recent Labs  06/18/15 0510 06/19/15 0400  NA 139 141  K 3.9 3.6  CL 95* 96*  CO2 36* 35*  GLUCOSE 93 92  BUN 18 14  CREATININE 1.48* 1.27*  CALCIUM 8.1* 8.3*   PT/INR  Recent Labs  06/18/15 0510 06/19/15 0400  LABPROT 14.5 14.2  INR 1.16 1.13     Recent Labs Lab 06/15/15 0514 06/16/15 0400 06/17/15 0403 06/18/15 0510 06/19/15 0400  ALBUMIN 1.7* 1.7* 1.6* 1.6* 1.7*     Lipase     Component Value Date/Time   LIPASE 36 06/01/2015 2100     Studies/Results: No results found.  Medications: . sodium chloride   Intravenous Once  . ampicillin (OMNIPEN) IV  2 g Intravenous Q6H  . antiseptic oral rinse  7 mL Mouth Rinse QID  . cefTRIAXone (ROCEPHIN)  IV  2 g Intravenous Q12H  . chlorhexidine gluconate (SAGE KIT)  15 mL Mouth Rinse BID  . feeding supplement  1 Container Oral TID BM  . levothyroxine  50 mcg Oral QAC breakfast  . pantoprazole  (PROTONIX) IV  40 mg Intravenous Q12H  . sodium chloride flush  10-40 mL Intracatheter Q12H    Assessment/Plan Bleeding giant duodenal ulcer S/p Emergency exploratory laparotomy with oversewing of bleeding giant duodenal ulcer, 06/06/15, Dr. Jackolyn Confer Enterococcal Bacteremia/ sepsis/ endocarditis/ UTI Respiratory failure with hypoxia Shock:sepsis vs GI bleed Demand ischemia Acute kidney injury PCM Anemia/multiple transfusions: Last transfusion 06/15/15 Antibiotics: Ampicillin day 14 (bacteremia) DVT: SCD's    Plan:  Staples out tomorrow, benzoine and steri strip incision.  I will put follow up for our office in the AVS.  We will see her back in 2-3 weeks after discharge.  Call if we can be of any further assistance.    LOS: 18 days    Wynell Halberg 06/19/2015 [phone]

## 2015-06-19 NOTE — Progress Notes (Signed)
Physical Therapy Treatment Patient Details Name: MIRIAN CASCO MRN: [id number] DOB: 12-Oct-1931 Today's Date: 06/19/2015    History of Present Illness 82 year old with past mental history of hypothyroidism, hypertension, hyperlipidemia, depression. Admitted today to the ED after she had a fall at home. When she stood up to use the bathroom her legs gave out and she fell on the right side. She hurt her leg and chest. She did not hit her head and there was no loss of consciousness. She on the floor the whole night. When her son arrived next morning she was found to be lying in a large pool of melanotic stool. In the ED she was found to be hypertensive with hemoglobin of 7.4 (baseline unknown).  Pt is s/p Emergency exploratory laparotomy with oversewing of bleeding giant duodenal ulcer, 06/06/15    PT Comments    Elder spouse in room, stated pt was able to get in/out bed by herself and able to transfer self to and from [facility].  Also stated, pt did not amb and mostly stayed in the bed during the day.   Assisted with transitioning pt from supine to EOB required Total Assist pt only demonstrated 5% self assist.  Once upright and centered, pt was able to static sit EOB x 7 min while waiting for 2nd assist.  Used Hoyer pad to assist from bed to recliner.    Follow Up Recommendations  SNF     Equipment Recommendations       Recommendations for Other Services       Precautions / Restrictions      Mobility  Bed Mobility Overal bed mobility: Needs Assistance Bed Mobility: Rolling;Supine to Sit Rolling: Total assist (pt 5%)   Supine to sit: Total assist (Pt 0%)     General bed mobility comments: pt required MAX encouragement to increase self assist and motivate.  Used bed pad and Total Assist to transition from supine to EOB.  Very fearful of falling with HIGH anxiety.  Pt did sit EOB x 7 min at Mod Assist with posterior lean and difficulty righting self to midline.    Transfers                  General transfer comment: pt was unable to attempt sit to stand so used [employer] to transfer pt to recliner.  Increassed time to position to comfort.   Ambulation/Gait                 Stairs            Wheelchair Mobility    Modified Rankin (Stroke Patients Only)       Balance                                    Cognition Arousal/Alertness: Awake/alert Behavior During Therapy: WFL for tasks assessed/performed                        Exercises      General Comments        Pertinent Vitals/Pain Pain Assessment: No/denies pain    Home Living                      Prior Function            PT Goals (current goals can now be found in the care plan section) Progress towards PT  goals: Progressing toward goals    Frequency  Min 2X/week    PT Plan Current plan remains appropriate    Co-evaluation             End of Session Equipment Utilized During Treatment: Oxygen Activity Tolerance: Treatment limited secondary to medical complications (Comment) Patient left: in chair;with call bell/phone within reach     Time: 1115-1145 PT Time Calculation (min) (ACUTE ONLY): 30 min  Charges:  $Therapeutic Activity: 23-37 mins                    G Codes:      Felecia ShellingLori Koleman Marling  PTA [facility]  Acute  Rehab Pager      [phone]

## 2015-06-20 DIAGNOSIS — L8990 Pressure ulcer of unspecified site, unspecified stage: Secondary | ICD-10-CM

## 2015-06-20 DIAGNOSIS — N390 Urinary tract infection, site not specified: Secondary | ICD-10-CM

## 2015-06-20 DIAGNOSIS — I5033 Acute on chronic diastolic (congestive) heart failure: Secondary | ICD-10-CM

## 2015-06-20 LAB — CBC WITH DIFFERENTIAL/PLATELET
BASOS PCT: 0 %
Basophils Absolute: 0 10*3/uL (ref 0.0–0.1)
EOS ABS: 0.2 10*3/uL (ref 0.0–0.7)
EOS PCT: 4 %
HCT: 27.2 % — ABNORMAL LOW (ref 36.0–46.0)
Hemoglobin: 8.7 g/dL — ABNORMAL LOW (ref 12.0–15.0)
Lymphocytes Relative: 17 %
Lymphs Abs: 1 10*3/uL (ref 0.7–4.0)
MCH: 29.3 pg (ref 26.0–34.0)
MCHC: 32 g/dL (ref 30.0–36.0)
MCV: 91.6 fL (ref 78.0–100.0)
MONO ABS: 0.8 10*3/uL (ref 0.1–1.0)
MONOS PCT: 14 %
Neutro Abs: 3.7 10*3/uL (ref 1.7–7.7)
Neutrophils Relative %: 65 %
PLATELETS: 284 10*3/uL (ref 150–400)
RBC: 2.97 MIL/uL — ABNORMAL LOW (ref 3.87–5.11)
RDW: 17.2 % — AB (ref 11.5–15.5)
WBC: 5.8 10*3/uL (ref 4.0–10.5)

## 2015-06-20 LAB — RENAL FUNCTION PANEL
ALBUMIN: 1.7 g/dL — AB (ref 3.5–5.0)
ANION GAP: 8 (ref 5–15)
BUN: 11 mg/dL (ref 6–20)
CHLORIDE: 97 mmol/L — AB (ref 101–111)
CO2: 34 mmol/L — ABNORMAL HIGH (ref 22–32)
Calcium: 8.2 mg/dL — ABNORMAL LOW (ref 8.9–10.3)
Creatinine, Ser: 1.23 mg/dL — ABNORMAL HIGH (ref 0.44–1.00)
GFR calc Af Amer: 46 mL/min — ABNORMAL LOW (ref >60)
GFR, EST NON AFRICAN AMERICAN: 40 mL/min — AB (ref >60)
Glucose, Bld: 89 mg/dL (ref 65–99)
PHOSPHORUS: 3.5 mg/dL (ref 2.5–4.6)
POTASSIUM: 3.3 mmol/L — AB (ref 3.5–5.1)
Sodium: 139 mmol/L (ref 135–145)

## 2015-06-20 LAB — APTT: APTT: 37 s (ref 24–37)

## 2015-06-20 LAB — FIBRINOGEN: FIBRINOGEN: 652 mg/dL — AB (ref 204–475)

## 2015-06-20 LAB — PROTIME-INR
INR: 1.19 (ref 0.00–1.49)
Prothrombin Time: 14.8 seconds (ref 11.6–15.2)

## 2015-06-20 LAB — MAGNESIUM: MAGNESIUM: 2 mg/dL (ref 1.7–2.4)

## 2015-06-20 MED ORDER — CARVEDILOL 3.125 MG PO TABS: 3.1250 mg | ORAL_TABLET | Freq: Two times a day (BID) | ORAL | Status: AC

## 2015-06-20 MED ORDER — HEPARIN SOD (PORK) LOCK FLUSH 100 UNIT/ML IV SOLN
250.0000 [IU] | INTRAVENOUS | Status: AC | PRN
  Administered 2015-06-20: 250 [IU]

## 2015-06-20 MED ORDER — BISACODYL 10 MG RE SUPP: 10.0000 mg | RECTAL | Status: AC | PRN

## 2015-06-20 MED ORDER — TRAMADOL HCL 50 MG PO TABS: 50.0000 mg | ORAL_TABLET | Freq: Four times a day (QID) | ORAL | Status: AC | PRN

## 2015-06-20 MED ORDER — PANTOPRAZOLE SODIUM 40 MG PO TBEC: 40.0000 mg | DELAYED_RELEASE_TABLET | Freq: Two times a day (BID) | ORAL | Status: AC

## 2015-06-20 MED ORDER — LEVOTHYROXINE SODIUM 50 MCG PO TABS: 50.0000 ug | ORAL_TABLET | Freq: Every day | ORAL | Status: AC

## 2015-06-20 MED ORDER — BOOST / RESOURCE BREEZE PO LIQD: 1.0000 | Freq: Three times a day (TID) | ORAL | Status: AC

## 2015-06-20 MED ORDER — OXYCODONE HCL 5 MG PO TABS: 5.0000 mg | ORAL_TABLET | Freq: Four times a day (QID) | ORAL | Status: AC | PRN

## 2015-06-20 MED ORDER — POLYETHYLENE GLYCOL 3350 17 G PO PACK: 17.0000 g | PACK | Freq: Every day | ORAL | Status: AC

## 2015-06-20 MED ORDER — SODIUM CHLORIDE 0.9 % IV SOLN: 2.0000 g | Freq: Four times a day (QID) | INTRAVENOUS | Status: AC

## 2015-06-20 MED ORDER — POTASSIUM CHLORIDE CRYS ER 20 MEQ PO TBCR
40.0000 meq | EXTENDED_RELEASE_TABLET | Freq: Once | ORAL | Status: AC
  Administered 2015-06-20: 40 meq via ORAL
  Filled 2015-06-20: qty 2

## 2015-06-20 MED ORDER — ACETAMINOPHEN 325 MG PO TABS: 650.0000 mg | ORAL_TABLET | Freq: Four times a day (QID) | ORAL | Status: AC | PRN

## 2015-06-20 MED ORDER — DEXTROSE 5 % IV SOLN: 2.0000 g | Freq: Two times a day (BID) | INTRAVENOUS | Status: AC

## 2015-06-20 NOTE — Clinical Social Work Placement (Signed)
CSW received consult from [facility], Rosalita ChessmanSuzanne that patient & husband are now agreeable with plan for SNF. CSW provided SNF bed offers & [facility]. CS[facility] confirmed with [facility] that they would be able to take patient today.   [facility]. Patient & husband, Fayrene FearingJames made aware. Discharge packet given to RN, Clayborne DanaPatti. [facility] called for transport.     Lincoln MaxinKelly Kiowa Hollar, LCSW [facility] Clinical Social Worker cell #: [phone]    CLINICAL SOCIAL WORK PLACEMENT  NOTE  Date:  06/20/2015  Patient Details  Name: Jasmine PonsBetty G Vanhoesen MRN: [id number] Date of Birth: 30-Nov-1931  Clinical Social Work is seeking post-discharge placement for this patient at the Skilled  Nursing Facility level of care (*CSW will initial, date and re-position this form in  chart as items are completed):  Yes   Patient/family provided with [facility] Clinical Social Work Department's list of facilities offering this level of care within the geographic area requested by the patient (or if unable, by the patient's family).  Yes   Patient/family informed of their freedom to choose among providers that offer the needed level of care, that participate in Medicare, Medicaid or managed care program needed by the patient, have an available bed and are willing to accept the patient.  Yes   Patient/family informed of [facility]'s ownership interest in [facility] and [facility], as well as of the fact that they are under no obligation to receive care at these facilities.  PASRR submitted to EDS on 06/20/15     PASRR number received on 06/20/15     Existing PASRR number confirmed on       FL2 transmitted to all facilities in geographic area requested by pt/family on 06/20/15     FL2 transmitted to all facilities within larger geographic area on       Patient informed that his/her managed care company has contracts with or will  negotiate with certain facilities, including the following:        Yes   Patient/family informed of bed offers received.  [facility]     Physician recommends and patient chooses bed at      [facility] on 06/20/15.  Patient to be transferred to facility by [facility]     Patient family notified on 06/20/15 of transfer.  Name of family member notified:  patient's husband, Fayrene FearingJames via phone     PHYSICIAN       Additional Comment:    _______________________________________________ Arlyss RepressHarrison, Shacoya Burkhammer F, LCSW 06/20/2015, 3:27 PM

## 2015-06-20 NOTE — Discharge Summary (Addendum)
Physician Discharge Summary  Jasmine Newman ZOX:[id number] DOB: 1931-08-16 DOA: 06/01/2015  PCP: No primary care provider on file.  Admit date: 06/01/2015 Discharge date: 06/20/2015  Time spent: 60 minutes  CODE STATUS: DNR Recommendations for Outpatient Follow-up:  1. Will go to SNF  2. ampicillin and Rocephin - stop date of 5/22- needs repeat TEE (per ID, Dr Luciana Axe) prior to stopping antibiotics 3. remove PICC after antibiotics complete 4.  follow daily weights at facility   Discharge Condition: stable    Discharge Diagnoses:  Principal Problem:   Gastrointestinal hemorrhage associated with duodenal ulcer Active Problems:   Bacteremia   Acute endocarditis   Urinary tract infection, site not specified   Pressure ulcer   Hypovolemic shock (HCC)   NSTEMI (non-ST elevated myocardial infarction) (HCC)   Malnutrition of moderate degree   Anemia due to acute blood loss   Acute on chronic diastolic CHF (congestive heart failure) (HCC)   History of present illness:  82 year old with past mental history of hypothyroidism, hypertension, hyperlipidemia, depression admitted after she was found in a pool of melanotic stool where she had been laying all night. In the ED she was found to be anemic with hemoglobin of 7.4 (baseline unknown). She continued to be hypotensive despite of 2 L of fluid. GI evaluated the patient. PCCM admitted the patient and intubated her.   Hospital Course:  Principal Problem:  Gastrointestinal hemorrhage associated with duodenal ulcer -EGD performed on 06/02/2015 revealed a large 3 cm posterior wall duodenal ulcer that had a large adherent clot. Initially injected with epinephrine. She continued to have upper GI bleed, interventional radiology consulted undergoing coil embolization on 06/05/2015. -She re-bled for which general surgery evaluated her on 06/06/2015. She was taken to the operating room on 06/06/2015 where she underwent exploratory laparotomy with  oversewing of duodenal ulcer. - cont PPI BID- wean to daily in 1 month?  Active Problems:  Anemia - initially due to acute blood loss and more recently likely due to anemia of acute inflammation - received a total of 16 U PRBC along with 5 U FFP and 2 U pheresed platelets - Hb stable in 8-9 range for past week now    Hypovolemic shock - due to GI bleed- resolved   Enterococcal UTI/ Bacteremia/ sepsis/ endocarditis  - -Blood cultures and U culture obtained on 06/01/2015 and 06/04/2015 growing enterococcus species -Transesophageal echocardiogram had been planned for 06/12/2015, however, patient declined procedure.-she agreed to it later  - TEE 4/19- showed large vegetation in outflow tract- spoke with Dr Laneta Simmers from CT surgery- he agrees that she is a not candidate for surgery - cont antibiotics per ID- Dr Luciana Axe recommends ampicillin and Rocephin with stop date of 5/22 -  PICC line placed on 4/20   NSTEMI (non-ST elevated myocardial infarction)  - type 2 due to stress  Pulm edema/ volume overloaded/ grade 2 diastolic heart failure - treated with IV lasix which was stopped on 4/18- start low dose Coreg - can add low dose ACE I in 1 wk if BP remains stable -  follow daily weights at facility  Left heel ulcer - Prevalon boots- wound care    Bedbound  - no longer able to transfer from bed to chair since being hospitalized    Procedures: 2 D ECHO 06/03/15 Akinesis of the anteroseptal wall with overall mildly reduced LV  function; grade 2 diastolic dysfunction; elevated LV filling  pressure; s/p bioprosthetic aortic valve with mildly elevated  gradient (mean 23 mmHg); severe  MR; moderate TR; mildly elevated  pulmonary pressure.  TEE 4/19 Normal LV EF 60% Mild MR Tissue AVR. Large vegetation on ventricular side of LVOT. Mild to moderate resultant AR Normal TV/PV Intervalvular fibrosa thickened but no aneurysm or obvious abscess No effusion No PFO No pericardial  effusion  4/8 Emergency exploratory laparotomy with oversewing of bleeding giant duodenal ulcer  Consultations: Consultants:   PCCM  gen surgery  ID  CT surgery  Discharge Exam: Filed Weights   06/18/15 0417 06/19/15 0455 06/20/15 0500  Weight: 101.334 kg (223 lb 6.4 oz) 101.606 kg (224 lb) 95.437 kg (210 lb 6.4 oz)   Filed Vitals:   06/19/15 2130 06/20/15 0500  BP: 117/56 108/50  Pulse: 74 65  Temp: 98.1 F (36.7 C) 98 F (36.7 C)  Resp: 18 18    General: AAO x 3, no distress Cardiovascular: RRR, no murmurs  Respiratory: clear to auscultation bilaterally GI: soft, non-tender, non-distended, bowel sound positive  Discharge Instructions You were cared for by a hospitalist during your hospital stay. If you have any questions about your discharge medications or the care you received while you were in the hospital after you are discharged, you can call the unit and asked to speak with the hospitalist on call if the hospitalist that took care of you is not available. Once you are discharged, your primary care physician will handle any further medical issues. Please note that NO REFILLS for any discharge medications will be authorized once you are discharged, as it is imperative that you return to your primary care physician (or establish a relationship with a primary care physician if you do not have one) for your aftercare needs so that they can reassess your need for medications and monitor your lab values.      Discharge Instructions    Discharge instructions    Complete by:  As directed   High protein low fat low sodium diet     Increase activity slowly    Complete by:  As directed      Increase activity slowly    Complete by:  As directed             Medication List    TAKE these medications        acetaminophen 325 MG tablet  Commonly known as:  TYLENOL  Take 2 tablets (650 mg total) by mouth every 6 (six) hours as needed for mild pain, fever or headache.      ampicillin 2 g in sodium chloride 0.9 % 50 mL  Inject 2 g into the vein every 6 (six) hours.     bisacodyl 10 MG suppository  Commonly known as:  DULCOLAX  Place 1 suppository (10 mg total) rectally as needed for moderate constipation.     carvedilol 3.125 MG tablet  Commonly known as:  COREG  Take 1 tablet (3.125 mg total) by mouth 2 (two) times daily with a meal.     cefTRIAXone 2 g in dextrose 5 % 50 mL  Inject 2 g into the vein every 12 (twelve) hours.     feeding supplement Liqd  Take 1 Container by mouth 3 (three) times daily between meals.     levothyroxine 50 MCG tablet  Commonly known as:  SYNTHROID, LEVOTHROID  Take 1 tablet (50 mcg total) by mouth daily before breakfast.     oxyCODONE 5 MG immediate release tablet  Commonly known as:  Oxy IR/ROXICODONE  Take 1 tablet (5 mg total) by mouth  every 6 (six) hours as needed for severe pain.     pantoprazole 40 MG tablet  Commonly known as:  PROTONIX  Take 1 tablet (40 mg total) by mouth 2 (two) times daily. Switch for any other PPI at similar dose and frequency     polyethylene glycol packet  Commonly known as:  MIRALAX / GLYCOLAX  Take 17 g by mouth daily. Hold if she is having > 2 BMs daily     traMADol 50 MG tablet  Commonly known as:  ULTRAM  Take 1 tablet (50 mg total) by mouth every 6 (six) hours as needed for moderate pain.       No Known Allergies Follow-up Information    Follow up with Adolph Pollack, MD.   Specialty:  General Surgery   Why:  Call and make an appointment in 2 weeks after discharge.   Contact information:   [address] [place] [address] [phone]        The results of significant diagnostics from this hospitalization (including imaging, microbiology, ancillary and laboratory) are listed below for reference.    Significant Diagnostic Studies: Dg Ribs Unilateral W/chest Right  06/01/2015  CLINICAL DATA:  Right rib pain after fall last night at home. EXAM:  RIGHT RIBS AND CHEST - 3+ VIEW COMPARISON:  January 08, 2005. FINDINGS: No fracture or other bone lesions are seen involving the ribs. There is no evidence of pneumothorax or pleural effusion. Both lungs are clear. Heart size and mediastinal contours are within normal limits. IMPRESSION: Normal right ribs.  No acute cardiopulmonary abnormality seen. Electronically Signed   By: Lupita Raider, M.D.   On: 06/01/2015 11:55   Dg Knee 2 Views Right  06/01/2015  CLINICAL DATA:  Right knee pain today, fall last night. EXAM: RIGHT KNEE - 1-2 VIEW COMPARISON:  None. FINDINGS: Severe tricompartmental DJD with complete loss of joint space throughout and associated osteophyte formation. There is diffuse osseous demineralization/ osteopenia. No fracture line or displaced fracture fragment seen. No appreciable joint effusion. Adjacent soft tissues are unremarkable. Sclerotic focus within the distal femur shaft is incompletely imaged but most likely benign and favored to be chronic bone infarct. IMPRESSION: 1. No acute findings.  No osseous fracture or dislocation. 2. Severe tricompartmental DJD. Electronically Signed   By: Bary Richard M.D.   On: 06/01/2015 15:38   Ir Angiogram Visceral Selective  06/06/2015  INDICATION: Gastrointestinal bleeding EXAM: SELECTIVE VISCERAL ARTERIOGRAPHY; ADDITIONAL ARTERIOGRAPHY; IR ULTRASOUND GUIDANCE VASC ACCESS RIGHT MEDICATIONS: . The antibiotic was administered within 1 hour of the procedure ANESTHESIA/SEDATION: Moderate (conscious) sedation was employed during this procedure. A total of Versed 0 mg and Fentanyl 0 mcg was administered intravenously. Moderate Sedation Time: minutes. The patient's level of consciousness and vital signs were monitored continuously by radiology nursing throughout the procedure under my direct supervision. CONTRAST:  100 cc Omnipaque 300 FLUOROSCOPY TIME:  Fluoroscopy Time: 31 minutes 12 seconds (1469 mGy). COMPLICATIONS: None immediate. PROCEDURE: Informed  consent was obtained from the patient following explanation of the procedure, risks, benefits and alternatives. The patient understands, agrees and consents for the procedure. All questions were addressed. A time out was performed prior to the initiation of the procedure. Maximal barrier sterile technique utilized including caps, mask, sterile gowns, sterile gloves, large sterile drape, hand hygiene, and Betadine prep. Right groin was prepped and draped in a sterile fashion. Under sonographic guidance, a micropuncture needle was inserted into the right common femoral artery and removed over  a 018 wire which was up sized to a Bentson. A 5 French sheath was inserted. The SMA was selected. Angiography was performed. This showed abrupt extravasation of contrast from the gastroduodenal artery at the inferior extent of the coils previously placed. A micro catheter was then advanced into the inferior pancreaticoduodenal artery. And angiography was performed. There are 2 branches leading to the area of extravasation. The micro catheter was successfully placed into the inferior branch and interlock coils were deployed. A 2 mm by 2 cm coil was deployed. Subsequently a 2 mm x 4 cm coil was deployed. A third 2 mm by 2 cm coil was deployed. Cannulation of the second branch was unsuccessful due to the tortuous course. Final angiogram through the SMA demonstrates persistent contrast extravasation. The Cobra catheter was then exchanged for a sauce Omni catheter. The celiac axis was selected and angiography was performed. Injection of the celiac demonstrated no evidence of contrast extravasation. A micro catheter was then advanced through the celiac axis and into the common hepatic artery. Repeat angiography demonstrates no extravasation of contrast. The sauce Omni catheter was retracted to the right external iliac artery and femoral angiography was performed. The sheath was removed and hemostasis was achieved with an Exoseal  device. FINDINGS: Angiography of the SMA demonstrates abrupt contrast extravasation from the gastroduodenal artery at the inferior extent of the coils. There are 2 branches leading to the gastroduodenal artery via the pancreaticoduodenal branches. One of these was successfully embolized with coils and repeat angiogram confirms occlusion. The other branch could not be reached or embolized and final angiogram confirms patency of this vessel with persistent bleeding. IMPRESSION: The diagnostic angiogram confirms persistent extravasation and active bleeding into the duodenum via the gastroduodenal artery at the inferior extent of the coils. Two branches of the Fahrenheit deep pancreaticoduodenal artery were identified as feeding vessels. One of these was successfully embolized. The other could not be embolized and persistent bleeding at the end of the procedure was noted. Injection of the celiac axis demonstrates no evidence of recurrent gastrointestinal bleeding. Electronically Signed   By: Jolaine Click M.D.   On: 06/06/2015 17:21   Ir Angiogram Visceral Selective  06/06/2015  INDICATION: Gastrointestinal bleeding EXAM: SELECTIVE VISCERAL ARTERIOGRAPHY; ADDITIONAL ARTERIOGRAPHY; IR ULTRASOUND GUIDANCE VASC ACCESS RIGHT MEDICATIONS: . The antibiotic was administered within 1 hour of the procedure ANESTHESIA/SEDATION: Moderate (conscious) sedation was employed during this procedure. A total of Versed 0 mg and Fentanyl 0 mcg was administered intravenously. Moderate Sedation Time: minutes. The patient's level of consciousness and vital signs were monitored continuously by radiology nursing throughout the procedure under my direct supervision. CONTRAST:  100 cc Omnipaque 300 FLUOROSCOPY TIME:  Fluoroscopy Time: 31 minutes 12 seconds (1469 mGy). COMPLICATIONS: None immediate. PROCEDURE: Informed consent was obtained from the patient following explanation of the procedure, risks, benefits and alternatives. The patient  understands, agrees and consents for the procedure. All questions were addressed. A time out was performed prior to the initiation of the procedure. Maximal barrier sterile technique utilized including caps, mask, sterile gowns, sterile gloves, large sterile drape, hand hygiene, and Betadine prep. Right groin was prepped and draped in a sterile fashion. Under sonographic guidance, a micropuncture needle was inserted into the right common femoral artery and removed over a 018 wire which was up sized to a Bentson. A 5 French sheath was inserted. The SMA was selected. Angiography was performed. This showed abrupt extravasation of contrast from the gastroduodenal artery at the inferior extent of the coils  previously placed. A micro catheter was then advanced into the inferior pancreaticoduodenal artery. And angiography was performed. There are 2 branches leading to the area of extravasation. The micro catheter was successfully placed into the inferior branch and interlock coils were deployed. A 2 mm by 2 cm coil was deployed. Subsequently a 2 mm x 4 cm coil was deployed. A third 2 mm by 2 cm coil was deployed. Cannulation of the second branch was unsuccessful due to the tortuous course. Final angiogram through the SMA demonstrates persistent contrast extravasation. The Cobra catheter was then exchanged for a sauce Omni catheter. The celiac axis was selected and angiography was performed. Injection of the celiac demonstrated no evidence of contrast extravasation. A micro catheter was then advanced through the celiac axis and into the common hepatic artery. Repeat angiography demonstrates no extravasation of contrast. The sauce Omni catheter was retracted to the right external iliac artery and femoral angiography was performed. The sheath was removed and hemostasis was achieved with an Exoseal device. FINDINGS: Angiography of the SMA demonstrates abrupt contrast extravasation from the gastroduodenal artery at the inferior  extent of the coils. There are 2 branches leading to the gastroduodenal artery via the pancreaticoduodenal branches. One of these was successfully embolized with coils and repeat angiogram confirms occlusion. The other branch could not be reached or embolized and final angiogram confirms patency of this vessel with persistent bleeding. IMPRESSION: The diagnostic angiogram confirms persistent extravasation and active bleeding into the duodenum via the gastroduodenal artery at the inferior extent of the coils. Two branches of the Fahrenheit deep pancreaticoduodenal artery were identified as feeding vessels. One of these was successfully embolized. The other could not be embolized and persistent bleeding at the end of the procedure was noted. Injection of the celiac axis demonstrates no evidence of recurrent gastrointestinal bleeding. Electronically Signed   By: Jolaine Click M.D.   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 ANESTHESIA/SEDATION: 50 MCG FENTANYL ONLY Moderate Sedation Time: NONE. The patient's level of consciousness and vital signs were monitored continuously by radiology nursing throughout the procedure under my direct supervision. CONTRAST:  50mL ISOVUE-300 IOPAMIDOL (ISOVUE-300) INJECTION 61% FLUOROSCOPY TIME:  Fluoroscopy Time: 12 minutes 18 seconds (947 mGy). COMPLICATIONS: None immediate. PROCEDURE: Informed consent was obtained from the patient following explanation of the procedure, risks, benefits and alternatives. The patient understands, agrees and consents for the procedure. All questions were addressed. A time out was performed prior to the initiation of the procedure. Maximal barrier sterile  technique utilized including caps, mask, sterile gowns, sterile gloves, large sterile drape, hand hygiene, and Betadine prep. Under sterile conditions and local anesthesia, ultrasound micropuncture access performed of the patent right common femoral artery. Five French sheath inserted over a guidewire. Five French C2 catheter advanced over a Bentson guidewire to the celiac artery. Selective celiac angiogram performed. Celiac: Celiac is widely patent. Left gastric, splenic and hepatic vasculature patent. GDA is patent. There is active extravasation from the gastroduodenal artery localized to the level of the duodenum compatible with a bleeding duodenal ulcer. Renegade STC micro catheter and GT micro Glidewire were advanced through the C2 catheter into the gastroduodenal artery. Selective gastroduodenal angiogram performed. Gastroduodenal artery: Gastroduodenal artery is patent. Active bleeding present from a small  distal branch of the gastroduodenal artery compatible with an active bleeding duodenal ulcer. Embolization: Micro catheter was advanced over the catheter and guidewire to the level of the gastroduodenal artery bleeding site. Repeat injection confirms micro catheter position at the site of active bleeding. Micro coil embolization performed of the gastroduodenal artery with insertion of 3 3 mm x 6 mm interlock coils, 1 4 mm x 8 mm soft coil, 1 4 mm x 12 mm soft coil, and 1 5 mm x 8 mm soft coil. Post embolization angiogram demonstrates occlusion of the gastroduodenal artery with no further active bleeding. Micro catheter was retracted into the common hepatic artery. Common hepatic artery angiogram performed. Common hepatic: This confirms preserved patency of the common, proper, left and right hepatic arteries. Access removed. Hemostasis obtained with manual compression. No immediate complication. Patient tolerated the procedure well. IMPRESSION: Active bleeding from a small branch peripherally of the  gastroduodenal artery compatible with bleeding from a known giant duodenal ulcer. Successful micro coil embolization of the gastroduodenal artery to complete occlusion with no further active bleeding. Electronically Signed   By: Judie Petit.  Shick M.D.   On: 06/05/2015 13:27   Ir Angiogram Selective Each Additional Vessel  06/06/2015  INDICATION: Gastrointestinal bleeding EXAM: SELECTIVE VISCERAL ARTERIOGRAPHY; ADDITIONAL ARTERIOGRAPHY; IR ULTRASOUND GUIDANCE VASC ACCESS RIGHT MEDICATIONS: . The antibiotic was administered within 1 hour of the procedure ANESTHESIA/SEDATION: Moderate (conscious) sedation was employed during this procedure. A total of Versed 0 mg and Fentanyl 0 mcg was administered intravenously. Moderate Sedation Time: minutes. The patient's level of consciousness and vital signs were monitored continuously by radiology nursing throughout the procedure under my direct supervision. CONTRAST:  100 cc Omnipaque 300 FLUOROSCOPY TIME:  Fluoroscopy Time: 31 minutes 12 seconds (1469 mGy). COMPLICATIONS: None immediate. PROCEDURE: Informed consent was obtained from the patient following explanation of the procedure, risks, benefits and alternatives. The patient understands, agrees and consents for the procedure. All questions were addressed. A time out was performed prior to the initiation of the procedure. Maximal barrier sterile technique utilized including caps, mask, sterile gowns, sterile gloves, large sterile drape, hand hygiene, and Betadine prep. Right groin was prepped and draped in a sterile fashion. Under sonographic guidance, a micropuncture needle was inserted into the right common femoral artery and removed over a 018 wire which was up sized to a Bentson. A 5 French sheath was inserted. The SMA was selected. Angiography was performed. This showed abrupt extravasation of contrast from the gastroduodenal artery at the inferior extent of the coils previously placed. A micro catheter was then advanced  into the inferior pancreaticoduodenal artery. And angiography was performed. There are 2 branches leading to the area of extravasation. The micro catheter was successfully placed into the inferior branch and interlock coils were deployed. A 2 mm by 2 cm coil was deployed. Subsequently a 2 mm x 4 cm coil was deployed. A third 2 mm by 2 cm coil was deployed. Cannulation of the second branch was unsuccessful due to the tortuous course. Final angiogram through the SMA demonstrates persistent contrast extravasation. The Cobra catheter was then exchanged for a sauce Omni catheter. The celiac axis was selected and angiography was performed. Injection of the celiac demonstrated no evidence of contrast extravasation. A micro catheter was then advanced through the celiac axis and into the common hepatic artery. Repeat angiography demonstrates no extravasation of contrast. The sauce Omni catheter was retracted to the right external iliac artery and femoral angiography was performed. The sheath was removed and  hemostasis was achieved with an Exoseal device. FINDINGS: Angiography of the SMA demonstrates abrupt contrast extravasation from the gastroduodenal artery at the inferior extent of the coils. There are 2 branches leading to the gastroduodenal artery via the pancreaticoduodenal branches. One of these was successfully embolized with coils and repeat angiogram confirms occlusion. The other branch could not be reached or embolized and final angiogram confirms patency of this vessel with persistent bleeding. IMPRESSION: The diagnostic angiogram confirms persistent extravasation and active bleeding into the duodenum via the gastroduodenal artery at the inferior extent of the coils. Two branches of the Fahrenheit deep pancreaticoduodenal artery were identified as feeding vessels. One of these was successfully embolized. The other could not be embolized and persistent bleeding at the end of the procedure was noted. Injection of  the celiac axis demonstrates no evidence of recurrent gastrointestinal bleeding. Electronically Signed   By: Jolaine Click M.D.   On: 06/06/2015 17:21   Ir Angiogram Selective Each Additional Vessel  06/06/2015  INDICATION: Gastrointestinal bleeding EXAM: SELECTIVE VISCERAL ARTERIOGRAPHY; ADDITIONAL ARTERIOGRAPHY; IR ULTRASOUND GUIDANCE VASC ACCESS RIGHT MEDICATIONS: . The antibiotic was administered within 1 hour of the procedure ANESTHESIA/SEDATION: Moderate (conscious) sedation was employed during this procedure. A total of Versed 0 mg and Fentanyl 0 mcg was administered intravenously. Moderate Sedation Time: minutes. The patient's level of consciousness and vital signs were monitored continuously by radiology nursing throughout the procedure under my direct supervision. CONTRAST:  100 cc Omnipaque 300 FLUOROSCOPY TIME:  Fluoroscopy Time: 31 minutes 12 seconds (1469 mGy). COMPLICATIONS: None immediate. PROCEDURE: Informed consent was obtained from the patient following explanation of the procedure, risks, benefits and alternatives. The patient understands, agrees and consents for the procedure. All questions were addressed. A time out was performed prior to the initiation of the procedure. Maximal barrier sterile technique utilized including caps, mask, sterile gowns, sterile gloves, large sterile drape, hand hygiene, and Betadine prep. Right groin was prepped and draped in a sterile fashion. Under sonographic guidance, a micropuncture needle was inserted into the right common femoral artery and removed over a 018 wire which was up sized to a Bentson. A 5 French sheath was inserted. The SMA was selected. Angiography was performed. This showed abrupt extravasation of contrast from the gastroduodenal artery at the inferior extent of the coils previously placed. A micro catheter was then advanced into the inferior pancreaticoduodenal artery. And angiography was performed. There are 2 branches leading to the area of  extravasation. The micro catheter was successfully placed into the inferior branch and interlock coils were deployed. A 2 mm by 2 cm coil was deployed. Subsequently a 2 mm x 4 cm coil was deployed. A third 2 mm by 2 cm coil was deployed. Cannulation of the second branch was unsuccessful due to the tortuous course. Final angiogram through the SMA demonstrates persistent contrast extravasation. The Cobra catheter was then exchanged for a sauce Omni catheter. The celiac axis was selected and angiography was performed. Injection of the celiac demonstrated no evidence of contrast extravasation. A micro catheter was then advanced through the celiac axis and into the common hepatic artery. Repeat angiography demonstrates no extravasation of contrast. The sauce Omni catheter was retracted to the right external iliac artery and femoral angiography was performed. The sheath was removed and hemostasis was achieved with an Exoseal device. FINDINGS: Angiography of the SMA demonstrates abrupt contrast extravasation from the gastroduodenal artery at the inferior extent of the coils. There are 2 branches leading to the gastroduodenal artery via the  pancreaticoduodenal branches. One of these was successfully embolized with coils and repeat angiogram confirms occlusion. The other branch could not be reached or embolized and final angiogram confirms patency of this vessel with persistent bleeding. IMPRESSION: The diagnostic angiogram confirms persistent extravasation and active bleeding into the duodenum via the gastroduodenal artery at the inferior extent of the coils. Two branches of the Fahrenheit deep pancreaticoduodenal artery were identified as feeding vessels. One of these was successfully embolized. The other could not be embolized and persistent bleeding at the end of the procedure was noted. Injection of the celiac axis demonstrates no evidence of recurrent gastrointestinal bleeding. Electronically Signed   By: Jolaine ClickArthur  Hoss  M.D.   On: 06/06/2015 17:21   Ir Angiogram Selective Each Additional Vessel  06/06/2015  INDICATION: Gastrointestinal bleeding EXAM: SELECTIVE VISCERAL ARTERIOGRAPHY; ADDITIONAL ARTERIOGRAPHY; IR ULTRASOUND GUIDANCE VASC ACCESS RIGHT MEDICATIONS: . The antibiotic was administered within 1 hour of the procedure ANESTHESIA/SEDATION: Moderate (conscious) sedation was employed during this procedure. A total of Versed 0 mg and Fentanyl 0 mcg was administered intravenously. Moderate Sedation Time: minutes. The patient's level of consciousness and vital signs were monitored continuously by radiology nursing throughout the procedure under my direct supervision. CONTRAST:  100 cc Omnipaque 300 FLUOROSCOPY TIME:  Fluoroscopy Time: 31 minutes 12 seconds (1469 mGy). COMPLICATIONS: None immediate. PROCEDURE: Informed consent was obtained from the patient following explanation of the procedure, risks, benefits and alternatives. The patient understands, agrees and consents for the procedure. All questions were addressed. A time out was performed prior to the initiation of the procedure. Maximal barrier sterile technique utilized including caps, mask, sterile gowns, sterile gloves, large sterile drape, hand hygiene, and Betadine prep. Right groin was prepped and draped in a sterile fashion. Under sonographic guidance, a micropuncture needle was inserted into the right common femoral artery and removed over a 018 wire which was up sized to a Bentson. A 5 French sheath was inserted. The SMA was selected. Angiography was performed. This showed abrupt extravasation of contrast from the gastroduodenal artery at the inferior extent of the coils previously placed. A micro catheter was then advanced into the inferior pancreaticoduodenal artery. And angiography was performed. There are 2 branches leading to the area of extravasation. The micro catheter was successfully placed into the inferior branch and interlock coils were deployed. A 2  mm by 2 cm coil was deployed. Subsequently a 2 mm x 4 cm coil was deployed. A third 2 mm by 2 cm coil was deployed. Cannulation of the second branch was unsuccessful due to the tortuous course. Final angiogram through the SMA demonstrates persistent contrast extravasation. The Cobra catheter was then exchanged for a sauce Omni catheter. The celiac axis was selected and angiography was performed. Injection of the celiac demonstrated no evidence of contrast extravasation. A micro catheter was then advanced through the celiac axis and into the common hepatic artery. Repeat angiography demonstrates no extravasation of contrast. The sauce Omni catheter was retracted to the right external iliac artery and femoral angiography was performed. The sheath was removed and hemostasis was achieved with an Exoseal device. FINDINGS: Angiography of the SMA demonstrates abrupt contrast extravasation from the gastroduodenal artery at the inferior extent of the coils. There are 2 branches leading to the gastroduodenal artery via the pancreaticoduodenal branches. One of these was successfully embolized with coils and repeat angiogram confirms occlusion. The other branch could not be reached or embolized and final angiogram confirms patency of this vessel with persistent bleeding. IMPRESSION: The diagnostic  angiogram confirms persistent extravasation and active bleeding into the duodenum via the gastroduodenal artery at the inferior extent of the coils. Two branches of the Fahrenheit deep pancreaticoduodenal artery were identified as feeding vessels. One of these was successfully embolized. The other could not be embolized and persistent bleeding at the end of the procedure was noted. Injection of the celiac axis demonstrates no evidence of recurrent gastrointestinal bleeding. Electronically Signed   By: Jolaine Click M.D.   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 ANESTHESIA/SEDATION: 50 MCG FENTANYL ONLY Moderate Sedation Time: NONE. The patient's level of consciousness and vital signs were monitored continuously by radiology nursing throughout the procedure under my direct supervision. CONTRAST:  50mL ISOVUE-300 IOPAMIDOL (ISOVUE-300) INJECTION 61% FLUOROSCOPY TIME:  Fluoroscopy Time: 12 minutes 18 seconds (947 mGy). COMPLICATIONS: None immediate. PROCEDURE: Informed consent was obtained from the patient following explanation of the procedure, risks, benefits and alternatives. The patient understands, agrees and consents for the procedure. All questions were addressed. A time out was performed prior to the initiation of the procedure. Maximal barrier sterile technique utilized including caps, mask, sterile gowns, sterile gloves, large sterile drape, hand hygiene, and Betadine prep. Under sterile conditions and local anesthesia, ultrasound micropuncture access performed of the patent right common femoral artery. Five French sheath inserted over a guidewire. Five French C2 catheter advanced over a Bentson guidewire to the celiac artery. Selective celiac angiogram performed. Celiac: Celiac is widely patent. Left gastric, splenic and hepatic vasculature patent. GDA is patent. There is active extravasation from the gastroduodenal artery localized to the level of the duodenum compatible with a bleeding duodenal ulcer. Renegade STC micro catheter and GT micro Glidewire were advanced through the C2 catheter into the gastroduodenal artery. Selective gastroduodenal angiogram performed. Gastroduodenal artery: Gastroduodenal artery is patent. Active bleeding present from a small distal branch of the gastroduodenal artery compatible with an  active bleeding duodenal ulcer. Embolization: Micro catheter was advanced over the catheter and guidewire to the level of the gastroduodenal artery bleeding site. Repeat injection confirms micro catheter position at the site of active bleeding. Micro coil embolization performed of the gastroduodenal artery with insertion of 3 3 mm x 6 mm interlock coils, 1 4 mm x 8 mm soft coil, 1 4 mm x 12 mm soft coil, and 1 5 mm x 8 mm soft coil. Post embolization angiogram demonstrates occlusion of the gastroduodenal artery with no further active bleeding. Micro catheter was retracted into the common hepatic artery. Common hepatic artery angiogram performed. Common hepatic: This confirms preserved patency of the common, proper, left and right hepatic arteries. Access removed. Hemostasis obtained with manual compression. No immediate complication. Patient tolerated the procedure well. 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 Successful micro coil embolization of the gastroduodenal artery to complete occlusion with no further active bleeding. Electronically Signed   By: Judie Petit.  Shick M.D.   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 ANESTHESIA/SEDATION: 50 MCG FENTANYL ONLY Moderate Sedation Time: NONE. The patient's level of consciousness and vital signs were monitored continuously by radiology nursing throughout the procedure under my direct supervision. CONTRAST:  50mL ISOVUE-300 IOPAMIDOL (ISOVUE-300) INJECTION 61% FLUOROSCOPY TIME:  Fluoroscopy Time: 12 minutes 18 seconds (947 mGy). COMPLICATIONS: None immediate.  PROCEDURE: Informed consent was obtained from the patient following explanation of the procedure, risks, benefits and alternatives. The patient understands, agrees and consents for the procedure. All questions were addressed. A time out was performed prior to the initiation of the procedure. Maximal barrier sterile technique utilized including caps, mask, sterile gowns, sterile gloves, large sterile drape, hand hygiene, and Betadine prep. Under sterile conditions and local anesthesia, ultrasound micropuncture access performed of the patent right common femoral artery. Five French sheath inserted over a guidewire. Five French C2 catheter advanced over a Bentson guidewire to the celiac artery. Selective celiac angiogram performed. Celiac: Celiac is widely patent. Left gastric, splenic and hepatic vasculature patent. GDA is patent. There is active extravasation from the gastroduodenal artery localized to the level of the duodenum compatible with a bleeding duodenal ulcer. Renegade STC micro catheter and GT micro Glidewire were advanced through the C2 catheter into the gastroduodenal artery. Selective gastroduodenal angiogram performed. Gastroduodenal artery: Gastroduodenal artery is patent. Active bleeding present from a small distal branch of the gastroduodenal artery compatible with an active bleeding duodenal ulcer. Embolization: Micro catheter was advanced over the catheter and guidewire to the level of the gastroduodenal artery bleeding site. Repeat injection confirms micro catheter position at the site of active bleeding. Micro coil embolization performed of the gastroduodenal artery with insertion of 3 3 mm x 6 mm interlock coils, 1 4 mm x 8 mm soft coil, 1 4 mm x 12 mm soft coil, and 1 5 mm x 8 mm soft coil. Post embolization angiogram demonstrates occlusion of the gastroduodenal artery with no further active bleeding. Micro catheter was retracted into the common hepatic artery. Common hepatic artery angiogram  performed. Common hepatic: This confirms preserved patency of the common, proper, left and right hepatic arteries. Access removed. Hemostasis obtained with manual compression. No immediate complication. Patient tolerated the procedure well. 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 Successful micro coil embolization of the gastroduodenal artery to complete occlusion with no further active bleeding. Electronically Signed   By: Judie Petit.  Shick M.D.   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 EXAM: RIGHT UPPER EXTREMITY PICC LINE PLACEMENT WITH ULTRASOUND AND FLUOROSCOPIC GUIDANCE MEDICATIONS: None ANESTHESIA/SEDATION: None FLUOROSCOPY TIME:  Fluoroscopy Time:  18 seconds COMPLICATIONS: None immediate. PROCEDURE: The patient's husband was advised of the possible risks and complications and agreed to undergo the procedure. The patient was then brought to the angiographic suite for the procedure. The right arm was prepped with chlorhexidine, draped in the usual sterile fashion using maximum barrier technique (cap and mask, sterile gown, sterile gloves, large sterile sheet, hand hygiene and cutaneous antisepsis) and infiltrated locally with 1% Lidocaine. Ultrasound demonstrated patency of the right basilic vein, and this was documented with an image. Under real-time ultrasound guidance, this vein was accessed with a 21 gauge micropuncture needle and image documentation was performed. A 0.018 wire was introduced in to the vein. Over this, a 5 [place] double lumen power injectable PICC was  advanced to the lower SVC/right atrial junction. Fluoroscopy during the procedure and fluoro spot radiograph confirms appropriate catheter position. The catheter was flushed and covered with a sterile dressing.  Catheter length: 40 cm IMPRESSION: Successful right arm power PICC line placement with ultrasound and fluoroscopic guidance. The catheter is ready for use. Read by: Jeananne Rama, PA-C Electronically Signed   By: Malachy Moan M.D.   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 EXAM: RIGHT UPPER EXTREMITY PICC LINE PLACEMENT WITH ULTRASOUND AND FLUOROSCOPIC GUIDANCE MEDICATIONS: None ANESTHESIA/SEDATION: None FLUOROSCOPY TIME:  Fluoroscopy Time:  18 seconds COMPLICATIONS: None immediate. PROCEDURE: The patient's husband was advised of the possible risks and complications and agreed to undergo the procedure. The patient was then brought to the angiographic suite for the procedure. The right arm was prepped with chlorhexidine, draped in the usual sterile fashion using maximum barrier technique (cap and mask, sterile gown, sterile gloves, large sterile sheet, hand hygiene and cutaneous antisepsis) and infiltrated locally with 1% Lidocaine. Ultrasound demonstrated patency of the right basilic vein, and this was documented with an image. Under real-time ultrasound guidance, this vein was accessed with a 21 gauge micropuncture needle and image documentation was performed. A 0.018 wire was introduced in to the vein. Over this, a 5 [place] double lumen power injectable PICC was advanced to the lower SVC/right atrial junction. Fluoroscopy during the procedure and fluoro spot radiograph confirms appropriate catheter position. The catheter was flushed and covered with a sterile dressing. Catheter length: 40 cm IMPRESSION: Successful right arm power PICC line placement with ultrasound and fluoroscopic guidance. The catheter is ready for use. Read by: Jeananne Rama, PA-C Electronically Signed   By: Malachy Moan M.D.   On: 06/19/2015 16:27   Ir US Guide Vasc Access Right  06/06/2015  INDICATION:  Gastrointestinal bleeding EXAM: SELECTIVE VISCERAL ARTERIOGRAPHY; ADDITIONAL ARTERIOGRAPHY; IR ULTRASOUND GUIDANCE VASC ACCESS RIGHT MEDICATIONS: . The antibiotic was administered within 1 hour of the procedure ANESTHESIA/SEDATION: Moderate (conscious) sedation was employed during this procedure. A total of Versed 0 mg and Fentanyl 0 mcg was administered intravenously. Moderate Sedation Time: minutes. The patient's level of consciousness and vital signs were monitored continuously by radiology nursing throughout the procedure under my direct supervision. CONTRAST:  100 cc Omnipaque 300 FLUOROSCOPY TIME:  Fluoroscopy Time: 31 minutes 12 seconds (1469 mGy). COMPLICATIONS: None immediate. PROCEDURE: Informed consent was obtained from the patient following explanation of the procedure, risks, benefits and alternatives. The patient understands, agrees and consents for the procedure. All questions were addressed. A time out was performed prior to the initiation of the procedure. Maximal barrier sterile technique utilized including caps, mask, sterile gowns, sterile gloves, large sterile drape, hand hygiene, and Betadine prep. Right groin was prepped and draped in a sterile fashion. Under sonographic guidance, a micropuncture needle was inserted into the right common femoral artery and removed over a 018 wire which was up sized to a Bentson. A 5 French sheath was inserted. The SMA was selected. Angiography was performed. This showed abrupt extravasation of contrast from the gastroduodenal artery at the inferior extent of the coils previously placed. A micro catheter was then advanced into the inferior pancreaticoduodenal artery. And angiography was performed. There are 2 branches leading to the area of extravasation. The micro catheter was successfully placed into the inferior branch and interlock coils were deployed. A 2 mm by 2 cm coil was deployed.  Subsequently a 2 mm x 4 cm coil was deployed. A third 2 mm by 2 cm coil  was deployed. Cannulation of the second branch was unsuccessful due to the tortuous course. Final angiogram through the SMA demonstrates persistent contrast extravasation. The Cobra catheter was then exchanged for a sauce Omni catheter. The celiac axis was selected and angiography was performed. Injection of the celiac demonstrated no evidence of contrast extravasation. A micro catheter was then advanced through the celiac axis and into the common hepatic artery. Repeat angiography demonstrates no extravasation of contrast. The sauce Omni catheter was retracted to the right external iliac artery and femoral angiography was performed. The sheath was removed and hemostasis was achieved with an Exoseal device. FINDINGS: Angiography of the SMA demonstrates abrupt contrast extravasation from the gastroduodenal artery at the inferior extent of the coils. There are 2 branches leading to the gastroduodenal artery via the pancreaticoduodenal branches. One of these was successfully embolized with coils and repeat angiogram confirms occlusion. The other branch could not be reached or embolized and final angiogram confirms patency of this vessel with persistent bleeding. IMPRESSION: The diagnostic angiogram confirms persistent extravasation and active bleeding into the duodenum via the gastroduodenal artery at the inferior extent of the coils. Two branches of the Fahrenheit deep pancreaticoduodenal artery were identified as feeding vessels. One of these was successfully embolized. The other could not be embolized and persistent bleeding at the end of the procedure was noted. Injection of the celiac axis demonstrates no evidence of recurrent gastrointestinal bleeding. Electronically Signed   By: Jolaine Click M.D.   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 ANESTHESIA/SEDATION: 50 MCG FENTANYL ONLY Moderate Sedation Time: NONE. The patient's level of consciousness and vital signs were monitored continuously by radiology nursing throughout the procedure under my direct supervision. CONTRAST:  50mL ISOVUE-300 IOPAMIDOL (ISOVUE-300) INJECTION 61% FLUOROSCOPY TIME:  Fluoroscopy Time: 12 minutes 18 seconds (947 mGy). COMPLICATIONS: None immediate. PROCEDURE: Informed consent was obtained from the patient following explanation of the procedure, risks, benefits and alternatives. The patient understands, agrees and consents for the procedure. All questions were addressed. A time out was performed prior to the initiation of the procedure. Maximal barrier sterile technique utilized including caps, mask, sterile gowns, sterile gloves, large sterile drape, hand hygiene, and Betadine prep. Under sterile conditions and local anesthesia, ultrasound micropuncture access performed of the patent right common femoral artery. Five French sheath inserted over a guidewire. Five French C2 catheter advanced over a Bentson guidewire to the celiac artery. Selective celiac angiogram performed. Celiac: Celiac is widely patent. Left gastric, splenic and hepatic vasculature patent. GDA is patent. There is active extravasation from the gastroduodenal artery localized to the level of the duodenum compatible with a bleeding duodenal ulcer. Renegade STC micro catheter and GT micro Glidewire were advanced through the C2 catheter into the gastroduodenal artery. Selective gastroduodenal angiogram performed. Gastroduodenal artery: Gastroduodenal artery is patent. Active bleeding present from a small distal branch of the gastroduodenal artery compatible with an active bleeding duodenal ulcer. Embolization: Micro catheter was advanced over the catheter and guidewire to the level  of the gastroduodenal artery bleeding site. Repeat injection confirms micro catheter position at the site of active bleeding. Micro coil embolization performed  of the gastroduodenal artery with insertion of 3 3 mm x 6 mm interlock coils, 1 4 mm x 8 mm soft coil, 1 4 mm x 12 mm soft coil, and 1 5 mm x 8 mm soft coil. Post embolization angiogram demonstrates occlusion of the gastroduodenal artery with no further active bleeding. Micro catheter was retracted into the common hepatic artery. Common hepatic artery angiogram performed. Common hepatic: This confirms preserved patency of the common, proper, left and right hepatic arteries. Access removed. Hemostasis obtained with manual compression. No immediate complication. Patient tolerated the procedure well. 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 Successful micro coil embolization of the gastroduodenal artery to complete occlusion with no further active bleeding. Electronically Signed   By: Judie Petit.  Shick M.D.   On: 06/05/2015 13:27   Dg Chest Port 1 View  06/11/2015  CLINICAL DATA:  Shortness of breath. EXAM: PORTABLE CHEST 1 VIEW COMPARISON:  06/07/2015. FINDINGS: Interim extubation removal of NG tube. Right IJ line in stable position. Prior CABG. Cardiomegaly with persistent bilateral from interstitial prominence and bilateral pleural effusions, left side greater right. Findings consistent congestive heart failure. Slight worsening from prior exam. No pneumothorax. IMPRESSION: 1. Interim extubation removal of NG tube. Right IJ line in stable position. 2. Cardiomegaly with diffuse bilateral from interstitial prominence left side greater right. Bilateral pleural effusions left side greater right. Findings consistent congestive heart failure. Findings have progressed from prior exam . Electronically Signed   By: Maisie Fus  Register   On: 06/11/2015 07:12   Dg  Chest Port 1 View  06/07/2015  CLINICAL DATA:  82 year old female with a history of questionable finding on prior study. Endotracheal tube. Central line. EXAM: PORTABLE CHEST 1 VIEW COMPARISON:  06/06/2015, 06/03/2015 FINDINGS: Cardiomediastinal silhouette unchanged. Atherosclerotic calcification of the aortic arch. 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 Endotracheal tube remains in position terminating approximately 3.5 cm above the carina. Unchanged right IJ approach central venous catheter appearing to terminate in the superior vena cava. Gastric tube unchanged terminates in the left upper quadrant. Low lung volumes with mixed interstitial and airspace opacity at the right base partially obscuring the right heart border on the right hemidiaphragm. Retrocardiac region not well visualized with blunting of left costophrenic angle. No pneumothorax. IMPRESSION: Endotracheal tube terminates suitably above the carina. Otherwise unchanged support apparatus. Similar appearance of basilar, right greater than left interstitial and airspace opacities, potentially atelectasis, edema, and/ or consolidation. Small pleural effusions not excluded. Signed, Yvone Neu. Loreta Ave, DO [facility] Electronically Signed   By: Gilmer Mor D.O.   On: 06/07/2015 07:06   Dg Chest Port 1 View  06/06/2015  CLINICAL DATA:  82 year old with bleeding duodenal ulcer for which the patient has undergone transcatheter therapy, ventilator dependent respiratory failure. EXAM: PORTABLE CHEST 1 VIEW COMPARISON:  06/02/2015 and earlier. FINDINGS: Endotracheal tube tip in satisfactory position projecting approximately 2 cm above the carina. Right jugular central venous catheter tip projects over the lower SVC. Nasogastric tube courses below the diaphragm into the stomach. Interval worsening of atelectasis in the lung bases, with dense atelectasis at the left lung base. Lungs  otherwise clear. Pulmonary vascularity normal. Prior sternotomy CABG with stable mild cardiomegaly. IMPRESSION: 1. Support apparatus satisfactory. 2. Worsening atelectasis in the lung bases, left greater than right, since the examination 4 days ago. 3. Stable mild cardiomegaly without pulmonary edema. Electronically Signed   By:  Hulan Saas M.D.   On: 06/06/2015 18:16   Dg Chest Port 1 View  06/03/2015  CLINICAL DATA:  Intubation. EXAM: EXAM PORTABLE CHEST 1 VIEW COMPARISON:  06/02/2015. FINDINGS: Endotracheal tube is been partially withdrawn, its tip is 3.4 cm above the carina. Right IJ line stable position. Mediastinum and hilar structures normal. Prior median sternotomy. Heart size normal. Low lung volumes with mild bibasilar atelectasis and/or infiltrates. Tiny left pleural effusion cannot be excluded. No pneumothorax . IMPRESSION: 1. Endotracheal tube is been withdrawn in good anatomic position. Its tip is 3.4 cm above carina. Right IJ line stable position. 2. Stable mild bibasilar atelectasis and/or infiltrates. Tiny left pleural effusion cannot be excluded. Electronically Signed   By: Maisie Fus  Register   On: 06/03/2015 07:16   Dg Chest Port 1 View  06/02/2015  CLINICAL DATA:  Respiratory failure and endotracheal tube placement. EXAM: PORTABLE CHEST 1 VIEW COMPARISON:  06/01/2015 FINDINGS: The heart size and mediastinal contours are within normal limits. An endotracheal tube is been placed. The tip lies in the right mainstem bronchus and the tube should be pulled back at least 3-4 cm. Lungs show bibasilar atelectasis and potentially a component of bibasilar infiltrates. No edema, pneumothorax or significant pleural fluid identified. Stable appearance of right jugular central line with catheter tip in the SVC. The visualized skeletal structures are unremarkable. IMPRESSION: Endotracheal tube tip lies in the right mainstem bronchus and the tube should be pull-back roughly 3-4 cm. These results will be  called to the ordering clinician or representative by the Radiologist Assistant, and communication documented in the PACS or zVision Dashboard. Electronically Signed   By: Irish Lack M.D.   On: 06/02/2015 16:45   Dg Chest Portable 1 View  06/01/2015  CLINICAL DATA:  Central line placement EXAM: PORTABLE CHEST 1 VIEW COMPARISON:  Chest x-rays dated 06/01/2015 01/08/2005. FINDINGS: Mild cardiomegaly is stable. Median sternotomy wires appear intact and stable in alignment. Right IJ central line has been placed with tip adequately positioned at the level of the mid SVC. No pneumothorax. Lungs are clear. IMPRESSION: Right IJ central line placement with tip adequately positioned at the level of the mid SVC. No pneumothorax or other procedural complicating feature. Electronically Signed   By: Bary Richard M.D.   On: 06/01/2015 15:35   Dg Hip Unilat With Pelvis 2-3 Views Right  06/01/2015  CLINICAL DATA:  Fall last night, generalized pain. EXAM: DG HIP (WITH OR WITHOUT PELVIS) 2-3V RIGHT COMPARISON:  None. FINDINGS: Single view of the pelvis and two views of the right hip are provided. There is diffuse osseous demineralization/osteopenia which limits characterization of osseous detail, however, there is no fracture line or displaced fracture fragment identified. Right femoral head appears well positioned relative to the acetabulum. Degenerative changes are seen at each hip joint, at least moderate in degree, left slightly greater than right, with associated joint space narrowing and osseous spurring. Additional degenerative change noted within the lower lumbar spine. Soft tissues about the pelvis and right hip are unremarkable. IMPRESSION: 1. No acute findings.  No osseous fracture or dislocation seen. 2. Diffuse osseous demineralization/osteopenia. 3. Degenerative changes at the bilateral hip joints, at least moderate in degree, left slightly greater than right. Electronically Signed   By: Bary Richard M.D.    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 ANESTHESIA/SEDATION: 50 MCG FENTANYL ONLY Moderate Sedation Time: NONE. The patient's level of consciousness and vital signs were monitored continuously by radiology nursing throughout the procedure under my direct supervision. CONTRAST:  50mL ISOVUE-300 IOPAMIDOL (ISOVUE-300) INJECTION 61% FLUOROSCOPY TIME:  Fluoroscopy Time: 12 minutes 18 seconds (947 mGy). COMPLICATIONS: None immediate. PROCEDURE: Informed consent was obtained from the patient following explanation of the procedure, risks, benefits and alternatives. The patient understands, agrees and consents for the procedure. All questions were addressed. A time out was performed prior to the initiation of the procedure. Maximal barrier sterile technique utilized including caps, mask, sterile gowns, sterile gloves, large sterile drape, hand hygiene, and Betadine prep. Under sterile conditions and local anesthesia, ultrasound micropuncture access performed of the patent right common femoral artery. Five French sheath inserted over a guidewire. Five French C2 catheter advanced over a Bentson guidewire to the celiac artery. Selective celiac angiogram performed. Celiac: Celiac is widely patent. Left gastric, splenic and hepatic vasculature patent. GDA is patent. There is active extravasation from the gastroduodenal artery localized to the level of the duodenum compatible with a bleeding duodenal ulcer. Renegade STC micro catheter and GT micro Glidewire were advanced through the C2 catheter into the gastroduodenal artery. Selective gastroduodenal angiogram performed. Gastroduodenal artery:  Gastroduodenal artery is patent. Active bleeding present from a small distal branch of the gastroduodenal artery compatible with an active bleeding duodenal ulcer. Embolization: Micro catheter was advanced over the catheter and guidewire to the level of the gastroduodenal artery bleeding site. Repeat injection confirms micro catheter position at the site of active bleeding. Micro coil embolization performed of the gastroduodenal artery with insertion of 3 3 mm x 6 mm interlock coils, 1 4 mm x 8 mm soft coil, 1 4 mm x 12 mm soft coil, and 1 5 mm x 8 mm soft coil. Post embolization angiogram demonstrates occlusion of the gastroduodenal artery with no further active bleeding. Micro catheter was retracted into the common hepatic artery. Common hepatic artery angiogram performed. Common hepatic: This confirms preserved patency of the common, proper, left and right hepatic arteries. Access removed. Hemostasis obtained with manual compression. No immediate complication. Patient tolerated the procedure well. 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 Successful micro coil embolization of the gastroduodenal artery to complete occlusion with no further active bleeding. Electronically Signed   By: Judie Petit.  Shick M.D.   On: 06/05/2015 13:27    Microbiology: No results found for this or any previous visit (from the past 240 hour(s)).   Labs: Basic Metabolic Panel:  Recent Labs Lab 06/16/15 0400 06/17/15 0403 06/18/15 0510 06/19/15 0400 06/20/15 0416  NA 143 141 139 141 139  K 3.5 3.1* 3.9 3.6 3.3*  CL 93* 93* 95* 96* 97*  CO2 38* 40* 36* 35* 34*  GLUCOSE 108* 89 93 92 89  BUN 22* 19 18 14 11   CREATININE 1.31* 1.48* 1.48* 1.27* 1.23*  CALCIUM 8.5* 8.1* 8.1* 8.3* 8.2*  MG 2.0 1.9 2.0 2.1 2.0  PHOS 3.9 3.7 3.6 3.9 3.5   Liver Function Tests:  Recent Labs Lab 06/16/15 0400 06/17/15 0403 06/18/15 0510  06/19/15 0400 06/20/15 0416  ALBUMIN 1.7* 1.6* 1.6* 1.7* 1.7*   No results for input(s): LIPASE, AMYLASE in the last 168 hours. No results for input(s): AMMONIA in  the last 168 hours. CBC:  Recent Labs Lab 06/16/15 0400 06/17/15 0403 06/18/15 0510 06/19/15 0400 06/20/15 0416  WBC 6.0 6.3 5.8 6.3 5.8  NEUTROABS 4.0 4.0 3.6 4.2 3.7  HGB 8.4* 7.9* 8.5* 8.4* 8.7*  HCT 27.1* 25.3* 27.2* 27.3* 27.2*  MCV 92.8 93.4 91.6 90.7 91.6  PLT 205 220 244 269 284   Cardiac Enzymes: No results for input(s): CKTOTAL, CKMB, CKMBINDEX, TROPONINI in the last 168 hours. BNP: BNP (last 3 results)  Recent Labs  06/01/15 2100  BNP 329.7*    ProBNP (last 3 results) No results for input(s): PROBNP in the last 8760 hours.  CBG:  Recent Labs Lab 06/13/15 2327 06/14/15 0737 06/14/15 1522 06/14/15 2241 06/15/15 0637  GLUCAP 112* 128* 111* 141* 131*       Signed:  Calvert Cantor, MD [facility] 06/20/2015, 12:50 PM

## 2015-06-20 NOTE — Progress Notes (Signed)
Report called to Sana  At [facility].

## 2015-06-20 NOTE — Discharge Instructions (Signed)
AVOID ASPIRIN, IBUPROFEN, NAPROSYN   [facility]      [facility], [place] [phone]  OPEN ABDOMINAL SURGERY: POST OP INSTRUCTIONS  Always review your discharge instruction sheet given to you by the facility where your surgery was performed.  IF YOU HAVE DISABILITY OR FAMILY LEAVE FORMS, YOU MUST BRING THEM TO THE OFFICE FOR PROCESSING.  PLEASE DO NOT GIVE THEM TO YOUR DOCTOR.  1. A prescription for pain medication may be given to you upon discharge.  Take your pain medication as prescribed, if needed.  If narcotic pain medicine is not needed, then you may take acetaminophen (Tylenol) or ibuprofen (Advil) as needed. 2. Take your usually prescribed medications unless otherwise directed. 3. If you need a refill on your pain medication, please contact your pharmacy. They will contact our office to request authorization.  Prescriptions will not be filled after 5pm or on week-ends. 4. You should follow a light diet the first few days after arrival home, such as soup and crackers, pudding, etc.unless your doctor has advised otherwise. A high-fiber, low fat diet can be resumed as tolerated.   Be sure to include lots of fluids daily. Most patients will experience some swelling and bruising on the chest and neck area.  Ice packs will help.  Swelling and bruising can take several days to resolve 5. Most patients will experience some swelling and bruising in the area of the incision. Ice pack will help. Swelling and bruising can take several days to resolve..  6. It is common to experience some constipation if taking pain medication after surgery.  Increasing fluid intake and taking a stool softener will usually help or prevent this problem from occurring.  A mild laxative (Milk of Magnesia or Miralax) should be taken according to package directions if there are no bowel movements after 48 hours. 7.  You may have steri-strips (small skin tapes) in place directly over the incision.  These strips should be  left on the skin for 7-10 days.  If your surgeon used skin glue on the incision, you may shower in 24 hours.  The glue will flake off over the next 2-3 weeks.  Any sutures or staples will be removed at the office during your follow-up visit. You may find that a light gauze bandage over your incision may keep your staples from being rubbed or pulled. You may shower and replace the bandage daily. 8. ACTIVITIES:  You may resume regular (light) daily activities beginning the next day--such as daily self-care, walking, climbing stairs--gradually increasing activities as tolerated.  You may have sexual intercourse when it is comfortable.  Refrain from any heavy lifting or straining until approved by your doctor. a. You may drive when you no longer are taking prescription pain medication, you can comfortably wear a seatbelt, and you can safely maneuver your car and apply brakes b. Return to Work: ___________________________________ 9. You should see your doctor in the office for a follow-up appointment approximately two weeks after your surgery.  Make sure that you call for this appointment within a day or two after you arrive home to insure a convenient appointment time. OTHER INSTRUCTIONS:  _____________________________________________________________ _____________________________________________________________  WHEN TO CALL YOUR DOCTOR: 1. Fever over 101.0 2. Inability to urinate 3. Nausea and/or vomiting 4. Extreme swelling or bruising 5. Continued bleeding from incision. 6. Increased pain, redness, or drainage from the incision. 7. Difficulty swallowing or breathing 8. Muscle cramping or spasms. 9. Numbness or tingling in hands or feet or around lips.  The clinic staff is available to answer your questions during regular business hours.  Please dont hesitate to call and ask to speak to one of the nurses if you have concerns.  For further questions, please visit  [email or web address]

## 2015-06-23 ENCOUNTER — Encounter: Payer: Self-pay | Admitting: Internal Medicine

## 2015-06-23 ENCOUNTER — Non-Acute Institutional Stay (SKILLED_NURSING_FACILITY): Payer: Medicare Other | Admitting: Internal Medicine

## 2015-06-23 DIAGNOSIS — D62 Acute posthemorrhagic anemia: Secondary | ICD-10-CM | POA: Diagnosis not present

## 2015-06-23 DIAGNOSIS — R571 Hypovolemic shock: Secondary | ICD-10-CM | POA: Diagnosis not present

## 2015-06-23 DIAGNOSIS — N390 Urinary tract infection, site not specified: Secondary | ICD-10-CM | POA: Diagnosis not present

## 2015-06-23 DIAGNOSIS — K264 Chronic or unspecified duodenal ulcer with hemorrhage: Secondary | ICD-10-CM

## 2015-06-23 DIAGNOSIS — A419 Sepsis, unspecified organism: Secondary | ICD-10-CM

## 2015-06-23 DIAGNOSIS — I5033 Acute on chronic diastolic (congestive) heart failure: Secondary | ICD-10-CM

## 2015-06-23 DIAGNOSIS — I339 Acute and subacute endocarditis, unspecified: Secondary | ICD-10-CM

## 2015-06-23 DIAGNOSIS — I214 Non-ST elevation (NSTEMI) myocardial infarction: Secondary | ICD-10-CM

## 2015-06-23 DIAGNOSIS — B952 Enterococcus as the cause of diseases classified elsewhere: Secondary | ICD-10-CM

## 2015-06-23 DIAGNOSIS — L89629 Pressure ulcer of left heel, unspecified stage: Secondary | ICD-10-CM

## 2015-06-23 NOTE — Progress Notes (Deleted)
MRN: [id number] Name: SKIE VITRANO  Sex: female Age: 82 y.o. DOB: [birth year]  PSC #:  Facility/Room: Level Of Care: SNF Provider: Merrilee Seashore D Emergency Contacts: Extended Emergency Contact Information Primary Emergency Contact: Abeln,JAMES Address: [address]          [place],  [address] Home Phone: [phone] Relation: None  Code Status:   Allergies: Review of patient's allergies indicates no known allergies.  Chief Complaint  Patient presents with  . New Admit To SNF    HPI: Patient is 82 y.o. female who  Past Medical History  Diagnosis Date  . Hypertension   . Thyroid disease     hypothyroidism  . Hyperlipidemia   . Depression   . Anemia   . CHF (congestive heart failure) [facility])     Past Surgical History  Procedure Laterality Date  . Esophagogastroduodenoscopy N/A 06/02/2015    Procedure: ESOPHAGOGASTRODUODENOSCOPY (EGD);  Surgeon: Graylin Shiver, MD;  Location: [facility];  Service: Endoscopy;  Laterality: N/A;  . Laparotomy N/A 06/06/2015    Procedure: EXPLORATORY LAPAROTOMY OVERSEWING OF BLEEDING DUODENAL ULCER;  Surgeon: Avel Peace, MD;  Location: [facility];  Service: General;  Laterality: N/A;  . Tee without cardioversion N/A 06/18/2015    Procedure: TRANSESOPHAGEAL ECHOCARDIOGRAM (TEE);  Surgeon: Wendall Stade, MD;  Location: [facility] ENDOSCOPY;  Service: Cardiovascular;  Laterality: N/A;      Medication List       This list is accurate as of: 06/23/15 11:59 PM.  Always use your most recent med list.               acetaminophen 325 MG tablet  Commonly known as:  TYLENOL  Take 2 tablets (650 mg total) by mouth every 6 (six) hours as needed for mild pain, fever or headache.     ampicillin 2 g in sodium chloride 0.9 % 50 mL  Inject 2 g into the vein every 6 (six) hours.     bisacodyl 10 MG suppository  Commonly known as:  DULCOLAX  Place 1 suppository (10 mg total) rectally as needed for moderate constipation.     carvedilol  3.125 MG tablet  Commonly known as:  COREG  Take 1 tablet (3.125 mg total) by mouth 2 (two) times daily with a meal.     cefTRIAXone 2 g in dextrose 5 % 50 mL  Inject 2 g into the vein every 12 (twelve) hours.     feeding supplement Liqd  Take 1 Container by mouth 3 (three) times daily between meals.     levothyroxine 50 MCG tablet  Commonly known as:  SYNTHROID, LEVOTHROID  Take 1 tablet (50 mcg total) by mouth daily before breakfast.     oxyCODONE 5 MG immediate release tablet  Commonly known as:  Oxy IR/ROXICODONE  Take 1 tablet (5 mg total) by mouth every 6 (six) hours as needed for severe pain.     pantoprazole 40 MG tablet  Commonly known as:  PROTONIX  Take 1 tablet (40 mg total) by mouth 2 (two) times daily. Switch for any other PPI at similar dose and frequency     polyethylene glycol packet  Commonly known as:  MIRALAX / GLYCOLAX  Take 17 g by mouth daily. Hold if she is having > 2 BMs daily     traMADol 50 MG tablet  Commonly known as:  ULTRAM  Take 1 tablet (50 mg total) by mouth every 6 (six) hours as needed for moderate pain.  No orders of the defined types were placed in this encounter.    Immunization History  Administered Date(s) Administered  . Tdap 06/28/2011    Social History  Substance Use Topics  . Smoking status: Former Games developermoker  . Smokeless tobacco: Not on file  . Alcohol Use: Not on file    Family history is    Review of Systems  DATA OBTAINED: from patient, nurse, medical record, family member GENERAL:  no fevers, fatigue, appetite changes SKIN: No itching, rash or wounds EYES: No eye pain, redness, discharge EARS: No earache, tinnitus, change in hearing NOSE: No congestion, drainage or bleeding  MOUTH/THROAT: No mouth or tooth pain, No sore throat RESPIRATORY: No cough, wheezing, SOB CARDIAC: No chest pain, palpitations, lower extremity edema  GI: No abdominal pain, No N/V/D or constipation, No heartburn or reflux  GU: No  dysuria, frequency or urgency, or incontinence  MUSCULOSKELETAL: No unrelieved bone/joint pain NEUROLOGIC: No headache, dizziness or focal weakness PSYCHIATRIC: No c/o anxiety or sadness   Filed Vitals:   06/23/15 1318  BP: 126/61  Pulse: 90  Temp: 98.2 F (36.8 C)  Resp: 16    SpO2 Readings from Last 1 Encounters:  06/23/15 95%        Physical Exam  GENERAL APPEARANCE: Alert, conversant,  No acute distress.  SKIN: No diaphoresis rash HEAD: Normocephalic, atraumatic  EYES: Conjunctiva/lids clear. Pupils round, reactive. EOMs intact.  EARS: External exam WNL, canals clear. Hearing grossly normal.  NOSE: No deformity or discharge.  MOUTH/THROAT: Lips w/o lesions  RESPIRATORY: Breathing is even, unlabored. Lung sounds are clear   CARDIOVASCULAR: Heart RRR no murmurs, rubs or gallops. No peripheral edema.   GASTROINTESTINAL: Abdomen is soft, non-tender, not distended w/ normal bowel sounds. GENITOURINARY: Bladder non tender, not distended  MUSCULOSKELETAL: No abnormal joints or musculature NEUROLOGIC:  Cranial nerves 2-12 grossly intact. Moves all extremities  PSYCHIATRIC: Mood and affect appropriate to situation, no behavioral issues  Patient Active Problem List   Diagnosis Date Noted  . Enterococcus UTI 06/27/2015  . Sepsis (HCC) 06/27/2015  . Decubitus ulcer, heel 06/27/2015  . Acute on chronic diastolic CHF (congestive heart failure) (HCC) 06/20/2015  . Urinary tract infection, site not specified 06/20/2015  . Acute endocarditis   . Anemia due to acute blood loss   . Malnutrition of moderate degree 06/10/2015  . Gastrointestinal hemorrhage associated with duodenal ulcer   . Hypovolemic shock (HCC)   . Bacteremia   . NSTEMI (non-ST elevated myocardial infarction) (HCC)   . Pressure ulcer 06/01/2015  . AKI (acute kidney injury) (HCC)   . Dehydration   . Fall   . Gastrointestinal hemorrhage with melena     CBC    Component Value Date/Time   WBC 5.8  06/20/2015 0416   RBC 2.97* 06/20/2015 0416   HGB 8.7* 06/20/2015 0416   HCT 27.2* 06/20/2015 0416   PLT 284 06/20/2015 0416   MCV 91.6 06/20/2015 0416   LYMPHSABS 1.0 06/20/2015 0416   MONOABS 0.8 06/20/2015 0416   EOSABS 0.2 06/20/2015 0416   BASOSABS 0.0 06/20/2015 0416    CMP     Component Value Date/Time   NA 139 06/20/2015 0416   K 3.3* 06/20/2015 0416   CL 97* 06/20/2015 0416   CO2 34* 06/20/2015 0416   GLUCOSE 89 06/20/2015 0416   BUN 11 06/20/2015 0416   CREATININE 1.23* 06/20/2015 0416   CREATININE 1.14* 06/28/2011 1328   CALCIUM 8.2* 06/20/2015 0416   PROT 5.2* 06/12/2015 0330  ALBUMIN 1.7* 06/20/2015 0416   AST 34 06/12/2015 0330   ALT 21 06/12/2015 0330   ALKPHOS 86 06/12/2015 0330   BILITOT 0.6 06/12/2015 0330   GFRNONAA 40* 06/20/2015 0416   GFRAA 46* 06/20/2015 0416    No results found for: HGBA1C   Dg Ribs Unilateral W/chest Right  06/01/2015  CLINICAL DATA:  Right rib pain after fall last night at home. EXAM: RIGHT RIBS AND CHEST - 3+ VIEW COMPARISON:  January 08, 2005. FINDINGS: No fracture or other bone lesions are seen involving the ribs. There is no evidence of pneumothorax or pleural effusion. Both lungs are clear. Heart size and mediastinal contours are within normal limits. IMPRESSION: Normal right ribs.  No acute cardiopulmonary abnormality seen. Electronically Signed   By: Lupita Raider, M.D.   On: 06/01/2015 11:55   Dg Knee 2 Views Right  06/01/2015  CLINICAL DATA:  Right knee pain today, fall last night. EXAM: RIGHT KNEE - 1-2 VIEW COMPARISON:  None. FINDINGS: Severe tricompartmental DJD with complete loss of joint space throughout and associated osteophyte formation. There is diffuse osseous demineralization/ osteopenia. No fracture line or displaced fracture fragment seen. No appreciable joint effusion. Adjacent soft tissues are unremarkable. Sclerotic focus within the distal femur shaft is incompletely imaged but most likely benign and favored  to be chronic bone infarct. IMPRESSION: 1. No acute findings.  No osseous fracture or dislocation. 2. Severe tricompartmental DJD. Electronically Signed   By: Bary Richard M.D.   On: 06/01/2015 15:38   Dg Chest Port 1 View  06/02/2015  CLINICAL DATA:  Respiratory failure and endotracheal tube placement. EXAM: PORTABLE CHEST 1 VIEW COMPARISON:  06/01/2015 FINDINGS: The heart size and mediastinal contours are within normal limits. An endotracheal tube is been placed. The tip lies in the right mainstem bronchus and the tube should be pulled back at least 3-4 cm. Lungs show bibasilar atelectasis and potentially a component of bibasilar infiltrates. No edema, pneumothorax or significant pleural fluid identified. Stable appearance of right jugular central line with catheter tip in the SVC. The visualized skeletal structures are unremarkable. IMPRESSION: Endotracheal tube tip lies in the right mainstem bronchus and the tube should be pull-back roughly 3-4 cm. These results will be called to the ordering clinician or representative by the Radiologist Assistant, and communication documented in the PACS or zVision Dashboard. Electronically Signed   By: Irish Lack M.D.   On: 06/02/2015 16:45   Dg Chest Portable 1 View  06/01/2015  CLINICAL DATA:  Central line placement EXAM: PORTABLE CHEST 1 VIEW COMPARISON:  Chest x-rays dated 06/01/2015 01/08/2005. FINDINGS: Mild cardiomegaly is stable. Median sternotomy wires appear intact and stable in alignment. Right IJ central line has been placed with tip adequately positioned at the level of the mid SVC. No pneumothorax. Lungs are clear. IMPRESSION: Right IJ central line placement with tip adequately positioned at the level of the mid SVC. No pneumothorax or other procedural complicating feature. Electronically Signed   By: Bary Richard M.D.   On: 06/01/2015 15:35   Dg Hip Unilat With Pelvis 2-3 Views Right  06/01/2015  CLINICAL DATA:  Fall last night, generalized pain.  EXAM: DG HIP (WITH OR WITHOUT PELVIS) 2-3V RIGHT COMPARISON:  None. FINDINGS: Single view of the pelvis and two views of the right hip are provided. There is diffuse osseous demineralization/osteopenia which limits characterization of osseous detail, however, there is no fracture line or displaced fracture fragment identified. Right femoral head appears well positioned relative to  the acetabulum. Degenerative changes are seen at each hip joint, at least moderate in degree, left slightly greater than right, with associated joint space narrowing and osseous spurring. Additional degenerative change noted within the lower lumbar spine. Soft tissues about the pelvis and right hip are unremarkable. IMPRESSION: 1. No acute findings.  No osseous fracture or dislocation seen. 2. Diffuse osseous demineralization/osteopenia. 3. Degenerative changes at the bilateral hip joints, at least moderate in degree, left slightly greater than right. Electronically Signed   By: Bary Richard M.D.   On: 06/01/2015 11:52    Not all labs, radiology exams or other studies done during hospitalization come through on my EPIC note; however they are reviewed by me.    Assessment and Plan  Gastrointestinal hemorrhage associated with duodenal ulcer -EGD performed on 06/02/2015 revealed a large 3 cm posterior wall duodenal ulcer that had a large adherent clot. Initially injected with epinephrine. She continued to have upper GI bleed, interventional radiology consulted undergoing coil embolization on 06/05/2015. -She re-bled for which general surgery evaluated her on 06/06/2015. She was taken to the operating room on 06/06/2015 where she underwent exploratory laparotomy with oversewing of duodenal ulcer. SNF - cont PPI BID- wean to daily in 1 month; f/u CBC  Anemia due to acute blood loss initially due to acute blood loss and more recently likely due to anemia of acute inflammation - received a total of 16 U PRBC along with 5 U FFP  and 2 U pheresed platelets - Hb stable in 8-9 range for past week now SNF - will f/u CBC  Hypovolemic shock (HCC) due to GI bleed- resolved  Enterococcus UTI U culture obtained on 06/01/2015 and 06/04/2015 growing enterococcus species; cont antibiotics ampicillin and Rocephin - PICC line placed on 4/20  Acute endocarditis Blood cultures and U culture obtained on 06/01/2015 and 06/04/2015 growing enterococcus species -Transesophageal echocardiogram had been planned for 06/12/2015, however, patient declined procedure.-she agreed to it later  - TEE 4/19- showed large vegetation in outflow tract- spoke with Dr Laneta Simmers from CT surgery- he agrees that she is a not candidate for surgery - cont antibiotics per ID- Dr Luciana Axe recommends ampicillin and Rocephin with stop date of 5/22 - PICC line placed on 4/20\ SNF - cont ampicillin and rocephin until 5/22  Sepsis (HCC) Blood cultures and U culture obtained on 06/01/2015 and 06/04/2015 growing enterococcus species; PICC line placed 4/20 SNF- cont antibiotics per ID- Dr Luciana Axe recommends ampicillin and Rocephin with stop date of 5/22  NSTEMI (non-ST elevated myocardial infarction) (HCC) type 2 due to stress SNF - cont coreg 3.125 mg BID  Acute on chronic diastolic CHF (congestive heart failure) (HCC) treated with IV lasix which was stopped on 4/18- start low dose Coreg - can add low dose ACE I in 1 wk if BP remains stable - follow daily weights at facility SNF - cont coreg 3.125 mg BID ;follow BP and if improves add back ACE  Decubitus ulcer, heel SNF - Prevalon boots- wound care      Margit Hanks, MD

## 2015-06-23 NOTE — Progress Notes (Signed)
MRN: [id number] Name: Jasmine Newman  Sex: female Age: 82 y.o. DOB: [birth year]  PSC #: Ronni Rumble Facility/Room:124 Level Of Care: SNF Provider: Merrilee Seashore MD Emergency Contacts: Extended Emergency Contact Information Primary Emergency Contact: Whidden,JAMES Address: [address]          [place],  [address] Home Phone: [phone] Relation: None  Code Status:   Allergies: Review of patient's allergies indicates no known allergies.  Chief Complaint  Patient presents with  . New Admit To SNF    HPI: Patient is 82 y.o. female with hypothyroidism, hypertension, hyperlipidemia, depression who she was found in a pool of melanotic stool where she had been laying all night. In the ED she was found to be anemic with hemoglobin of 7.4 (baseline unknown). She continued to be hypotensive despite of 2 L of fluid. GI evaluated the patient. Pt was admitted to [facility] from 4/2-21 where she was treated got GI bleed froma duodenal ulcer that finally had to be taken to surgery to stop the bleeding. Hospital course was complicated by an NSTEMI and acute on chronic diastolic CHF as well as enterococcal UTI and sepsis and endocarditis that will require weeks of antibiotics. Pt is admitted to SNF with generalized weakness and for IV antibiotics until 5/22 for her endocarditis.  Past Medical History  Diagnosis Date  . Hypertension   . Thyroid disease     hypothyroidism  . Hyperlipidemia   . Depression   . Anemia   . CHF (congestive heart failure) [facility])     Past Surgical History  Procedure Laterality Date  . Esophagogastroduodenoscopy N/A 06/02/2015    Procedure: ESOPHAGOGASTRODUODENOSCOPY (EGD);  Surgeon: Graylin Shiver, MD;  Location: [facility];  Service: Endoscopy;  Laterality: N/A;  . Laparotomy N/A 06/06/2015    Procedure: EXPLORATORY LAPAROTOMY OVERSEWING OF BLEEDING DUODENAL ULCER;  Surgeon: Avel Peace, MD;  Location: [facility] OR[facility];  Service: General;  Laterality: N/A;  . Tee without  cardioversion N/A 06/18/2015    Procedure: TRANSESOPHAGEAL ECHOCARDIOGRAM (TEE);  Surgeon: Wendall Stade, MD;  Location: [facility] ENDOSCOPY;  Service: Cardiovascular;  Laterality: N/A;      Medication List       This list is accurate as of: 06/23/15 11:59 PM.  Always use your most recent med list.               acetaminophen 325 MG tablet  Commonly known as:  TYLENOL  Take 2 tablets (650 mg total) by mouth every 6 (six) hours as needed for mild pain, fever or headache.     ampicillin 2 g in sodium chloride 0.9 % 50 mL  Inject 2 g into the vein every 6 (six) hours.     bisacodyl 10 MG suppository  Commonly known as:  DULCOLAX  Place 1 suppository (10 mg total) rectally as needed for moderate constipation.     carvedilol 3.125 MG tablet  Commonly known as:  COREG  Take 1 tablet (3.125 mg total) by mouth 2 (two) times daily with a meal.     cefTRIAXone 2 g in dextrose 5 % 50 mL  Inject 2 g into the vein every 12 (twelve) hours.     feeding supplement Liqd  Take 1 Container by mouth 3 (three) times daily between meals.     levothyroxine 50 MCG tablet  Commonly known as:  SYNTHROID, LEVOTHROID  Take 1 tablet (50 mcg total) by mouth daily before breakfast.     oxyCODONE 5 MG immediate release tablet  Commonly known as:  Oxy IR/ROXICODONE  Take 1 tablet (5 mg total) by mouth every 6 (six) hours as needed for severe pain.     pantoprazole 40 MG tablet  Commonly known as:  PROTONIX  Take 1 tablet (40 mg total) by mouth 2 (two) times daily. Switch for any other PPI at similar dose and frequency     polyethylene glycol packet  Commonly known as:  MIRALAX / GLYCOLAX  Take 17 g by mouth daily. Hold if she is having > 2 BMs daily     traMADol 50 MG tablet  Commonly known as:  ULTRAM  Take 1 tablet (50 mg total) by mouth every 6 (six) hours as needed for moderate pain.        No orders of the defined types were placed in this encounter.    Immunization History  Administered  Date(s) Administered  . Tdap 06/28/2011    Social History  Substance Use Topics  . Smoking status: Former Games developermoker  . Smokeless tobacco: Not on file  . Alcohol Use: Not on file   FH - +CHF  Review of Systems  DATA OBTAINED: from patient, nurse GENERAL:  no fevers,+ fatigue, appetite changes SKIN: No itching, rash HEENT: No complaint RESPIRATORY: No cough, wheezing, SOB CARDIAC: No chest pain, palpitations, lower extremity edema  GI: No abdominal pain, No N/V/D or constipation, No heartburn or reflux  GU: No dysuria, frequency or urgency, or incontinence  MUSCULOSKELETAL: No unrelieved bone/joint pain NEUROLOGIC: No headache, dizziness  PSYCHIATRIC: No overt anxiety or sadness  Filed Vitals:   06/23/15 1318  BP: 126/61  Pulse: 90  Temp: 98.2 F (36.8 C)  Resp: 16    Physical Exam  GENERAL APPEARANCE: Alert, conversant, No acute distress  SKIN: No diaphoresis rash; L heel wound dressed HEENT: Unremarkable RESPIRATORY: Breathing is even, unlabored. Lung sounds are clear   CARDIOVASCULAR: Heart RRR no murmurs, rubs or gallops. No peripheral edema  GASTROINTESTINAL: Abdomen is soft, non-tender, not distended w/ normal bowel sounds.  GENITOURINARY: Bladder non tender, not distended  MUSCULOSKELETAL: No abnormal joints or musculature NEUROLOGIC: Cranial nerves 2-12 grossly intact. Moves all extremities PSYCHIATRIC: Mood and affect appropriate to situation, no behavioral issues  Patient Active Problem List   Diagnosis Date Noted  . Enterococcus UTI 06/27/2015  . Sepsis (HCC) 06/27/2015  . Decubitus ulcer, heel 06/27/2015  . Acute on chronic diastolic CHF (congestive heart failure) (HCC) 06/20/2015  . Urinary tract infection, site not specified 06/20/2015  . Acute endocarditis   . Anemia due to acute blood loss   . Malnutrition of moderate degree 06/10/2015  . Gastrointestinal hemorrhage associated with duodenal ulcer   . Hypovolemic shock (HCC)   . Bacteremia   .  NSTEMI (non-ST elevated myocardial infarction) (HCC)   . Pressure ulcer 06/01/2015  . AKI (acute kidney injury) (HCC)   . Dehydration   . Fall   . Gastrointestinal hemorrhage with melena     CBC    Component Value Date/Time   WBC 5.8 06/20/2015 0416   RBC 2.97* 06/20/2015 0416   HGB 8.7* 06/20/2015 0416   HCT 27.2* 06/20/2015 0416   PLT 284 06/20/2015 0416   MCV 91.6 06/20/2015 0416   LYMPHSABS 1.0 06/20/2015 0416   MONOABS 0.8 06/20/2015 0416   EOSABS 0.2 06/20/2015 0416   BASOSABS 0.0 06/20/2015 0416    CMP     Component Value Date/Time   NA 139 06/20/2015 0416   K 3.3* 06/20/2015 0416   CL 97* 06/20/2015 0416  CO2 34* 06/20/2015 0416   GLUCOSE 89 06/20/2015 0416   BUN 11 06/20/2015 0416   CREATININE 1.23* 06/20/2015 0416   CREATININE 1.14* 06/28/2011 1328   CALCIUM 8.2* 06/20/2015 0416   PROT 5.2* 06/12/2015 0330   ALBUMIN 1.7* 06/20/2015 0416   AST 34 06/12/2015 0330   ALT 21 06/12/2015 0330   ALKPHOS 86 06/12/2015 0330   BILITOT 0.6 06/12/2015 0330   GFRNONAA 40* 06/20/2015 0416   GFRAA 46* 06/20/2015 0416    Assessment and Plan  Gastrointestinal hemorrhage associated with duodenal ulcer -EGD performed on 06/02/2015 revealed a large 3 cm posterior wall duodenal ulcer that had a large adherent clot. Initially injected with epinephrine. She continued to have upper GI bleed, interventional radiology consulted undergoing coil embolization on 06/05/2015. -She re-bled for which general surgery evaluated her on 06/06/2015. She was taken to the operating room on 06/06/2015 where she underwent exploratory laparotomy with oversewing of duodenal ulcer. SNF - cont PPI BID- wean to daily in 1 month; f/u CBC  Anemia due to acute blood loss initially due to acute blood loss and more recently likely due to anemia of acute inflammation - received a total of 16 U PRBC along with 5 U FFP and 2 U pheresed platelets - Hb stable in 8-9 range for past week now SNF - will f/u  CBC  Hypovolemic shock (HCC) due to GI bleed- resolved  Enterococcus UTI U culture obtained on 06/01/2015 and 06/04/2015 growing enterococcus species; cont antibiotics ampicillin and Rocephin - PICC line placed on 4/20  Acute endocarditis Blood cultures and U culture obtained on 06/01/2015 and 06/04/2015 growing enterococcus species -Transesophageal echocardiogram had been planned for 06/12/2015, however, patient declined procedure.-she agreed to it later  - TEE 4/19- showed large vegetation in outflow tract- spoke with Dr Laneta Simmers from CT surgery- he agrees that she is a not candidate for surgery - cont antibiotics per ID- Dr Luciana Axe recommends ampicillin and Rocephin with stop date of 5/22 - PICC line placed on 4/20\ SNF - cont ampicillin and rocephin until 5/22  Sepsis (HCC) Blood cultures and U culture obtained on 06/01/2015 and 06/04/2015 growing enterococcus species; PICC line placed 4/20 SNF- cont antibiotics per ID- Dr Luciana Axe recommends ampicillin and Rocephin with stop date of 5/22  NSTEMI (non-ST elevated myocardial infarction) (HCC) type 2 due to stress SNF - cont coreg 3.125 mg BID  Acute on chronic diastolic CHF (congestive heart failure) (HCC) treated with IV lasix which was stopped on 4/18- start low dose Coreg - can add low dose ACE I in 1 wk if BP remains stable - follow daily weights at facility SNF - cont coreg 3.125 mg BID ;follow BP and if improves add back ACE  Decubitus ulcer, heel SNF - Prevalon boots- wound care     Time spent > 45 min;> 50% of time with patient was spent reviewing records, labs, tests and studies, counseling and developing plan of care  Jahaan Vanwagner  MD

## 2015-06-27 ENCOUNTER — Encounter: Payer: Self-pay | Admitting: Internal Medicine

## 2015-06-27 DIAGNOSIS — B952 Enterococcus as the cause of diseases classified elsewhere: Secondary | ICD-10-CM | POA: Insufficient documentation

## 2015-06-27 DIAGNOSIS — A419 Sepsis, unspecified organism: Secondary | ICD-10-CM | POA: Insufficient documentation

## 2015-06-27 DIAGNOSIS — L89609 Pressure ulcer of unspecified heel, unspecified stage: Secondary | ICD-10-CM | POA: Insufficient documentation

## 2015-06-27 DIAGNOSIS — N390 Urinary tract infection, site not specified: Secondary | ICD-10-CM

## 2015-06-27 NOTE — Assessment & Plan Note (Signed)
due to GI bleed- resolved

## 2015-06-27 NOTE — Assessment & Plan Note (Signed)
Blood cultures and U culture obtained on 06/01/2015 and 06/04/2015 growing enterococcus species; PICC line placed 4/20 SNF- cont antibiotics per ID- Dr Luciana Axeomer recommends ampicillin and Rocephin with stop date of 5/22

## 2015-06-27 NOTE — Assessment & Plan Note (Signed)
U culture obtained on 06/01/2015 and 06/04/2015 growing enterococcus species; cont antibiotics ampicillin and Rocephin - PICC line placed on 4/20

## 2015-06-27 NOTE — Assessment & Plan Note (Signed)
SNF - Prevalon boots- wound care

## 2015-06-27 NOTE — Assessment & Plan Note (Signed)
initially due to acute blood loss and more recently likely due to anemia of acute inflammation - received a total of 16 U PRBC along with 5 U FFP and 2 U pheresed platelets - Hb stable in 8-9 range for past week now SNF - will f/u CBC

## 2015-06-27 NOTE — Assessment & Plan Note (Signed)
treated with IV lasix which was stopped on 4/18- start low dose Coreg - can add low dose ACE I in 1 wk if BP remains stable - follow daily weights at facility SNF - cont coreg 3.125 mg BID ;follow BP and if improves add back ACE

## 2015-06-27 NOTE — Assessment & Plan Note (Signed)
-  EGD performed on 06/02/2015 revealed a large 3 cm posterior wall duodenal ulcer that had a large adherent clot. Initially injected with epinephrine. She continued to have upper GI bleed, interventional radiology consulted undergoing coil embolization on 06/05/2015. -She re-bled for which general surgery evaluated her on 06/06/2015. She was taken to the operating room on 06/06/2015 where she underwent exploratory laparotomy with oversewing of duodenal ulcer. SNF - cont PPI BID- wean to daily in 1 month; f/u CBC

## 2015-06-27 NOTE — Assessment & Plan Note (Signed)
type 2 due to stress SNF - cont coreg 3.125 mg BID

## 2015-06-27 NOTE — Assessment & Plan Note (Signed)
Blood cultures and U culture obtained on 06/01/2015 and 06/04/2015 growing enterococcus species -Transesophageal echocardiogram had been planned for 06/12/2015, however, patient declined procedure.-she agreed to it later  - TEE 4/19- showed large vegetation in outflow tract- spoke with Dr Laneta SimmersBartle from CT surgery- he agrees that she is a not candidate for surgery - cont antibiotics per ID- Dr Luciana Axeomer recommends ampicillin and Rocephin with stop date of 5/22 - PICC line placed on 4/20\ SNF - cont ampicillin and rocephin until 5/22

## 2015-07-02 ENCOUNTER — Encounter: Payer: Self-pay | Admitting: Adult Health

## 2015-07-02 ENCOUNTER — Non-Acute Institutional Stay (SKILLED_NURSING_FACILITY): Payer: Medicare Other | Admitting: Adult Health

## 2015-07-02 DIAGNOSIS — R197 Diarrhea, unspecified: Secondary | ICD-10-CM

## 2015-07-02 DIAGNOSIS — A09 Infectious gastroenteritis and colitis, unspecified: Secondary | ICD-10-CM

## 2015-07-02 NOTE — Progress Notes (Signed)
Patient ID: Jasmine Newman, female   DOB: [birth year], 82 y.o.   MRN: [id number]    Facility:  [facility]     CODE STATUS: DNR  No Known Allergies  Chief Complaint  Patient presents with  . Acute Visit    Diarrhea    HPI:  She has had a prolonged and complex hospitalization. She is on ampicillin and rocephin through 07-21-15 for endocarditis. Staff reports that she is having several diarrheal stools which are foul. There are no reports of fevers present.   Past Medical History  Diagnosis Date  . Hypertension   . Thyroid disease     hypothyroidism  . Hyperlipidemia   . Depression   . Anemia   . CHF (congestive heart failure) [facility])     Past Surgical History  Procedure Laterality Date  . Esophagogastroduodenoscopy N/A 06/02/2015    Procedure: ESOPHAGOGASTRODUODENOSCOPY (EGD);  Surgeon: Graylin Shiver, MD;  Location: [facility];  Service: Endoscopy;  Laterality: N/A;  . Laparotomy N/A 06/06/2015    Procedure: EXPLORATORY LAPAROTOMY OVERSEWING OF BLEEDING DUODENAL ULCER;  Surgeon: Avel Peace, MD;  Location: [facility] OR[facility];  Service: General;  Laterality: N/A;  . Tee without cardioversion N/A 06/18/2015    Procedure: TRANSESOPHAGEAL ECHOCARDIOGRAM (TEE);  Surgeon: Wendall Stade, MD;  Location: [facility] ENDOSCOPY;  Service: Cardiovascular;  Laterality: N/A;    Social History   Social History  . Marital Status: Single    Spouse Name: N/A  . Number of Children: N/A  . Years of Education: N/A   Occupational History  . Not on file.   Social History Main Topics  . Smoking status: Former Games developer  . Smokeless tobacco: Not on file  . Alcohol Use: Not on file  . Drug Use: Not on file  . Sexual Activity: Not on file   Other Topics Concern  . Not on file   Social History Narrative   Family History  Problem Relation Age of Onset  . Heart disease Mother       VITAL SIGNS BP 136/74 mmHg  Pulse 78  Temp(Src) 98.4 F (36.9 C) (Oral)  Resp 20  Ht 5\' 6"  (1.676 m)  Wt 213 lb  6 oz (96.786 kg)  BMI 34.46 kg/m2  SpO2 95%  LMP  (LMP Unknown)  Patient's Medications  New Prescriptions   No medications on file  Previous Medications   ACETAMINOPHEN (TYLENOL) 325 MG TABLET    Take 2 tablets (650 mg total) by mouth every 6 (six) hours as needed for mild pain, fever or headache.   AMPICILLIN 2 G IN SODIUM CHLORIDE 0.9 % 50 ML    Inject 2 g into the vein every 6 (six) hours.   BISACODYL (DULCOLAX) 10 MG SUPPOSITORY    Place 1 suppository (10 mg total) rectally as needed for moderate constipation.   CARVEDILOL (COREG) 3.125 MG TABLET    Take 1 tablet (3.125 mg total) by mouth 2 (two) times daily with a meal.   CEFTRIAXONE 2 G IN DEXTROSE 5 % 50 ML    Inject 2 g into the vein every 12 (twelve) hours.   COLLAGENASE (SANTYL) OINTMENT    Apply 1 application topically daily.   FEEDING SUPPLEMENT (BOOST / RESOURCE BREEZE) LIQD    Take 1 Container by mouth 3 (three) times daily between meals.   LEVOTHYROXINE (SYNTHROID, LEVOTHROID) 50 MCG TABLET    Take 1 tablet (50 mcg total) by mouth daily before breakfast.   MULTIPLE VITAMINS-MINERALS (DECUBI-VITE) CAPS    Take  by mouth daily.   OXYCODONE (OXY IR/ROXICODONE) 5 MG IMMEDIATE RELEASE TABLET    Take 1 tablet (5 mg total) by mouth every 6 (six) hours as needed for severe pain.   PANTOPRAZOLE (PROTONIX) 40 MG TABLET    Take 1 tablet (40 mg total) by mouth 2 (two) times daily. Switch for any other PPI at similar dose and frequency   POLYETHYLENE GLYCOL (MIRALAX / GLYCOLAX) PACKET    Take 17 g by mouth daily. Hold if she is having > 2 BMs daily   TRAMADOL (ULTRAM) 50 MG TABLET    Take 1 tablet (50 mg total) by mouth every 6 (six) hours as needed for moderate pain.   TRAZODONE (DESYREL) 25 MG TABS TABLET    Take 25 mg by mouth daily as needed for sleep.  Modified Medications   No medications on file  Discontinued Medications   No medications on file     SIGNIFICANT DIAGNOSTIC EXAMS  06-11-15: chest x-ray: 1. Interim extubation  removal of NG tube. Right IJ line in stable position. 2. Cardiomegaly with diffuse bilateral from interstitial prominence left side greater right. Bilateral pleural effusions left side greater right. Findings consistent congestive heart failure. Findings have progressed from prior exam .  06-18-15: TEE: Left ventricle: Systolic function was normal. The estimated ejection fraction was in the range of 55% to 60%. - Aortic valve: Tissue AVR. 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 Mild resulting AR. Gradients are a little elevated. No evidence of abscess. Valve area (VTI): 1.38 cm^2. Valve area   (Vmax): 1.2 cm^2. Valve area (Vmean): 1.11 cm^2. - Mitral valve: There was mild regurgitation. - Left atrium: The atrium was dilated. - Right atrium: No evidence of thrombus in the atrial cavity or appendage. - Atrial septum: No defect or patent foramen ovale was identified. - Tricuspid valve: No evidence of vegetation. - Pulmonic valve: No evidence of vegetation.    LABS REVIEWED:   06-01-15: wbc 11.1; hgb 7.4; hct 23.3; mcv 76.4 ;plt 215; glucose 112; bun 60; creat 2.50; k+ 4.5; na++140; liver normal albumin 2.4; urine culture: enterococcus; blood culture: enterococcus 06-05-15: wbc 7.5; hgb 7.9; hct 24.6; mcv 88.2 ;plt 119; glucose 98; bun 16; creat 1.36; k+ 3.8; na++144; phos 2.6; mag 2.1; albumin 1.9 06-06-15: DIC: PT 18.8; INR 16.3; aPTT 33; fibrogen 175; d-dimer 3.47 06-10-15: wbc 9.1; hgb 9.0; hct 26.8; mcv 89.3; plt 133; glucose 127; bun 11; creat 1.24; k+ 3.7; na++152; liver normal albumin 2.2; phos 3.2 pre-albumin 11.5  06-20-15: wbc 5.8; hgb 8.7; hct 27.2; mcv 91.6; plt 284; glucose 92; bun 14; creat 1.27; k+ 3.6; na++141; mag 2.0; phos 3.9; albumin 1.7     Review of Systems  Constitutional: Negative for malaise/fatigue.  Respiratory: Negative for cough and shortness of breath.   Cardiovascular: Negative for chest pain, palpitations and leg swelling.  Gastrointestinal: Positive for  diarrhea. Negative for heartburn and abdominal pain.  Musculoskeletal: Negative for myalgias.  Skin: Negative.   Psychiatric/Behavioral: The patient is not nervous/anxious.      Physical Exam  Constitutional: No distress.  Eyes: Conjunctivae are normal.  Neck: Neck supple. No JVD present. No thyromegaly present.  Cardiovascular: Normal rate, regular rhythm and intact distal pulses.   Respiratory: Effort normal and breath sounds normal. No respiratory distress. She has no wheezes.  GI: Soft. Bowel sounds are normal. She exhibits no distension. There is no tenderness.  Musculoskeletal: She exhibits no edema.  Able to move all extremities   Lymphadenopathy:  She has no cervical adenopathy.  Neurological: She is alert.  Skin: Skin is warm and dry. She is not diaphoretic.  Psychiatric: She has a normal mood and affect.       ASSESSMENT/ PLAN:  1. Diarrhea: will collect stool for c-diff; will begin flagyl 500 mg three times daily for 10 days; will check cbc cmp in the AM.     Synthia Innocenteborah Green NP [facility]  Contact [phone] Monday through Friday 8am- 5pm  After hours call [phone]

## 2015-07-08 ENCOUNTER — Non-Acute Institutional Stay (SKILLED_NURSING_FACILITY): Payer: Medicare Other | Admitting: Adult Health

## 2015-07-08 ENCOUNTER — Encounter: Payer: Self-pay | Admitting: Adult Health

## 2015-07-08 DIAGNOSIS — K264 Chronic or unspecified duodenal ulcer with hemorrhage: Secondary | ICD-10-CM | POA: Diagnosis not present

## 2015-07-08 DIAGNOSIS — L89629 Pressure ulcer of left heel, unspecified stage: Secondary | ICD-10-CM | POA: Diagnosis not present

## 2015-07-08 DIAGNOSIS — A419 Sepsis, unspecified organism: Secondary | ICD-10-CM | POA: Diagnosis not present

## 2015-07-08 DIAGNOSIS — I5033 Acute on chronic diastolic (congestive) heart failure: Secondary | ICD-10-CM | POA: Diagnosis not present

## 2015-07-08 NOTE — Progress Notes (Signed)
Patient ID: Jasmine Newman, female   DOB: August 27, 1931, 82 y.o.   MRN: [id number]  Facility:  [facility]     CODE STATUS: DNR  No Known Allergies  Chief Complaint  Patient presents with  . Discharge Note    Discharge from facility    HPI:  She is being discharged to home with pt/ot/rn. She will need the following dme: hospital bed; mechanical lift; reclining wheelchair; alternating air mattress; 3:1 commode. She will need her prescriptions to be written and will need to follow up with her medical provider.    Past Medical History  Diagnosis Date  . Hypertension   . Thyroid disease     hypothyroidism  . Hyperlipidemia   . Depression   . Anemia   . CHF (congestive heart failure) [facility])     Past Surgical History  Procedure Laterality Date  . Esophagogastroduodenoscopy N/A 06/02/2015    Procedure: ESOPHAGOGASTRODUODENOSCOPY (EGD);  Surgeon: Graylin Shiver, MD;  Location: [facility] ENDOSCOPY;  Service: Endoscopy;  Laterality: N/A;  . Laparotomy N/A 06/06/2015    Procedure: EXPLORATORY LAPAROTOMY OVERSEWING OF BLEEDING DUODENAL ULCER;  Surgeon: Avel Peace, MD;  Location: [facility] ORS;  Service: General;  Laterality: N/A;  . Tee without cardioversion N/A 06/18/2015    Procedure: TRANSESOPHAGEAL ECHOCARDIOGRAM (TEE);  Surgeon: Wendall Stade, MD;  Location[facility] ENDOSCOPY;  Service: Cardiovascular;  Laterality: N/A;    Social History   Social History  . Marital Status: Single    Spouse Name: N/A  . Number of Children: N/A  . Years of Education: N/A   Occupational History  . Not on file.   Social History Main Topics  . Smoking status: Former Games developer  . Smokeless tobacco: Not on file  . Alcohol Use: Not on file  . Drug Use: Not on file  . Sexual Activity: Not on file   Other Topics Concern  . Not on file   Social History Narrative   Family History  Problem Relation Age of Onset  . Heart disease Mother     VITAL SIGNS BP 136/74 mmHg  Pulse 78  Temp(Src) 98.4 F (36.9 C)  (Oral)  Resp 20  Ht 5\' 6"  (1.676 m)  Wt 210 lb (95.255 kg)  BMI 33.91 kg/m2  SpO2 95%  LMP  (LMP Unknown)  Patient's Medications  New Prescriptions   No medications on file  Previous Medications   ACETAMINOPHEN (TYLENOL) 325 MG TABLET    Take 2 tablets (650 mg total) by mouth every 6 (six) hours as needed for mild pain, fever or headache.   AMPICILLIN 2 G IN SODIUM CHLORIDE 0.9 % 50 ML    Inject 2 g into the vein every 6 (six) hours.   BISACODYL (DULCOLAX) 10 MG SUPPOSITORY    Place 1 suppository (10 mg total) rectally as needed for moderate constipation.   CARVEDILOL (COREG) 3.125 MG TABLET    Take 1 tablet (3.125 mg total) by mouth 2 (two) times daily with a meal.   CEFTRIAXONE 2 G IN DEXTROSE 5 % 50 ML    Inject 2 g into the vein every 12 (twelve) hours.   FEEDING SUPPLEMENT (BOOST / RESOURCE BREEZE) LIQD    Take 1 Container by mouth 3 (three) times daily between meals.   LEVOTHYROXINE (SYNTHROID, LEVOTHROID) 50 MCG TABLET    Take 1 tablet (50 mcg total) by mouth daily before breakfast.   METRONIDAZOLE (FLAGYL) 500 MG TABLET    Take 500 mg by mouth 3 (three) times daily.  MULTIPLE VITAMINS-MINERALS (DECUBI-VITE) CAPS    Take by mouth daily.   OXYCODONE (OXY IR/ROXICODONE) 5 MG IMMEDIATE RELEASE TABLET    Take 1 tablet (5 mg total) by mouth every 6 (six) hours as needed for severe pain.   OXYGEN    Inhale 2 L into the lungs.   PANTOPRAZOLE (PROTONIX) 40 MG TABLET    Take 1 tablet (40 mg total) by mouth 2 (two) times daily. Switch for any other PPI at similar dose and frequency   POLYETHYLENE GLYCOL (MIRALAX / GLYCOLAX) PACKET    Take 17 g by mouth daily. Hold if she is having > 2 BMs daily   TRAMADOL (ULTRAM) 50 MG TABLET    Take 1 tablet (50 mg total) by mouth every 6 (six) hours as needed for moderate pain.   TRAZODONE (DESYREL) 25 MG TABS TABLET    Take 25 mg by mouth daily as needed for sleep.   WOUND DRESSINGS (XEROFLO DRESSING 1"X8" EX)    Apply topically. Apply to Right knee  every three days  Modified Medications   No medications on file  Discontinued Medications   COLLAGENASE (SANTYL) OINTMENT    Apply 1 application topically daily. Reported on 07/08/2015     SIGNIFICANT DIAGNOSTIC EXAMS  06-11-15: chest x-ray: 1. Interim extubation removal of NG tube. Right IJ line in stable position. 2. Cardiomegaly with diffuse bilateral from interstitial prominence left side greater right. Bilateral pleural effusions left side greater right. Findings consistent congestive heart failure. Findings have progressed from prior exam .  06-18-15: TEE: Left ventricle: Systolic function was normal. The estimated ejection fraction was in the range of 55% to 60%. - Aortic valve: Tissue AVR. 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 Mild resulting AR. Gradients are a little elevated. No evidence of abscess. Valve area (VTI): 1.38 cm^2. Valve area   (Vmax): 1.2 cm^2. Valve area (Vmean): 1.11 cm^2. - Mitral valve: There was mild regurgitation. - Left atrium: The atrium was dilated. - Right atrium: No evidence of thrombus in the atrial cavity or appendage. - Atrial septum: No defect or patent foramen ovale was identified. - Tricuspid valve: No evidence of vegetation. - Pulmonic valve: No evidence of vegetation.    LABS REVIEWED:   06-01-15: wbc 11.1; hgb 7.4; hct 23.3; mcv 76.4 ;plt 215; glucose 112; bun 60; creat 2.50; k+ 4.5; na++140; liver normal albumin 2.4; urine culture: enterococcus; blood culture: enterococcus 06-05-15: wbc 7.5; hgb 7.9; hct 24.6; mcv 88.2 ;plt 119; glucose 98; bun 16; creat 1.36; k+ 3.8; na++144; phos 2.6; mag 2.1; albumin 1.9 06-06-15: DIC: PT 18.8; INR 16.3; aPTT 33; fibrogen 175; d-dimer 3.47 06-10-15: wbc 9.1; hgb 9.0; hct 26.8; mcv 89.3; plt 133; glucose 127; bun 11; creat 1.24; k+ 3.7; na++152; liver normal albumin 2.2; phos 3.2 pre-albumin 11.5  06-20-15: wbc 5.8; hgb 8.7; hct 27.2; mcv 91.6; plt 284; glucose 92; bun 14; creat 1.27; k+ 3.6; na++141; mag  2.0; phos 3.9; albumin 1.7     Review of Systems  Constitutional: Negative for malaise/fatigue.  Respiratory: Negative for cough and shortness of breath.   Cardiovascular: Negative for chest pain, palpitations and leg swelling.  Gastrointestinal: . Negative for heartburn and abdominal pain.  Musculoskeletal: Negative for myalgias.  Skin: Negative.   Psychiatric/Behavioral: The patient is not nervous/anxious.      Physical Exam  Constitutional: No distress.  Eyes: Conjunctivae are normal.  Neck: Neck supple. No JVD present. No thyromegaly present.  Cardiovascular: Normal rate, regular rhythm and intact distal  pulses.   Respiratory: Effort normal and breath sounds normal. No respiratory distress. She has no wheezes.  GI: Soft. Bowel sounds are normal. She exhibits no distension. There is no tenderness.  Musculoskeletal: She exhibits no edema.  Able to move all extremities   Lymphadenopathy:    She has no cervical adenopathy.  Neurological: She is alert.  Skin: Skin is warm and dry. She is not diaphoretic.  Psychiatric: She has a normal mood and affect.       ASSESSMENT/ PLAN:   Patient is being discharged with the following home health services:  Pt/ot/rn: to evaluate and treat as indicated for gait; balance; strength; adl training; medication management IV abt therapy::   Patient is being discharged with the following durable medical equipment:  She requires a semi-electric hospital bed; she has supplemental 02 and cannot maintain her 02 sats when lying flat. She is unable to reposition herself and requires frequent position changes which cannot be achieved in a standard bed. She requires alternating air loss mattress as she is high risk for skin breakdown. She requires a mechanical lift for her transfers. She requires a reclining wheelchair, standard, in order for her to maintain her current level of independence with her adl's; which cannot be achieved with a walker. She  requires a wheelchair cushion and requires a 3:1 commode.   Patient has been advised to f/u with their PCP in 1-2 weeks to bring them up to date on their rehab stay.  Social services at facility is  responsible for arranging this appointment.  Pt was provided with a 30 day supply of prescriptions for medications and refills must be obtained from their PCP.  For controlled substances, a more limited supply may be provided adequate until PCP appointment only. #30 ultram 50 mg tabs #15 oxycodone 5 mg tabs.     Time spent with patient  45  minutes >50% time spent counseling; reviewing medical record; tests; labs; and developing future plan of care    Synthia InnocentDeborah Ebone Alcivar NP [facility]  Contact [phone] Monday through Friday 8am- 5pm  After hours call [phone]

## 2015-07-10 DIAGNOSIS — I214 Non-ST elevation (NSTEMI) myocardial infarction: Secondary | ICD-10-CM | POA: Diagnosis not present

## 2015-07-10 DIAGNOSIS — B952 Enterococcus as the cause of diseases classified elsewhere: Secondary | ICD-10-CM | POA: Diagnosis not present

## 2015-07-10 DIAGNOSIS — I330 Acute and subacute infective endocarditis: Secondary | ICD-10-CM | POA: Diagnosis not present

## 2015-07-10 DIAGNOSIS — Z48815 Encounter for surgical aftercare following surgery on the digestive system: Secondary | ICD-10-CM | POA: Diagnosis not present

## 2015-07-11 ENCOUNTER — Ambulatory Visit: Payer: Medicare Other | Admitting: Family Medicine

## 2015-07-22 ENCOUNTER — Inpatient Hospital Stay: Payer: Medicare Other | Admitting: Internal Medicine

## 2015-07-31 DEATH — deceased

## 2017-06-12 IMAGING — DX DG CHEST 1V PORT
1 series · 1 of 1 positions shown · non-contrast
Comparison: Chest x-rays dated 06/01/2015 01/08/2005.

CLINICAL DATA: Central line placement

EXAM:
PORTABLE CHEST 1 VIEW

[chest ap]
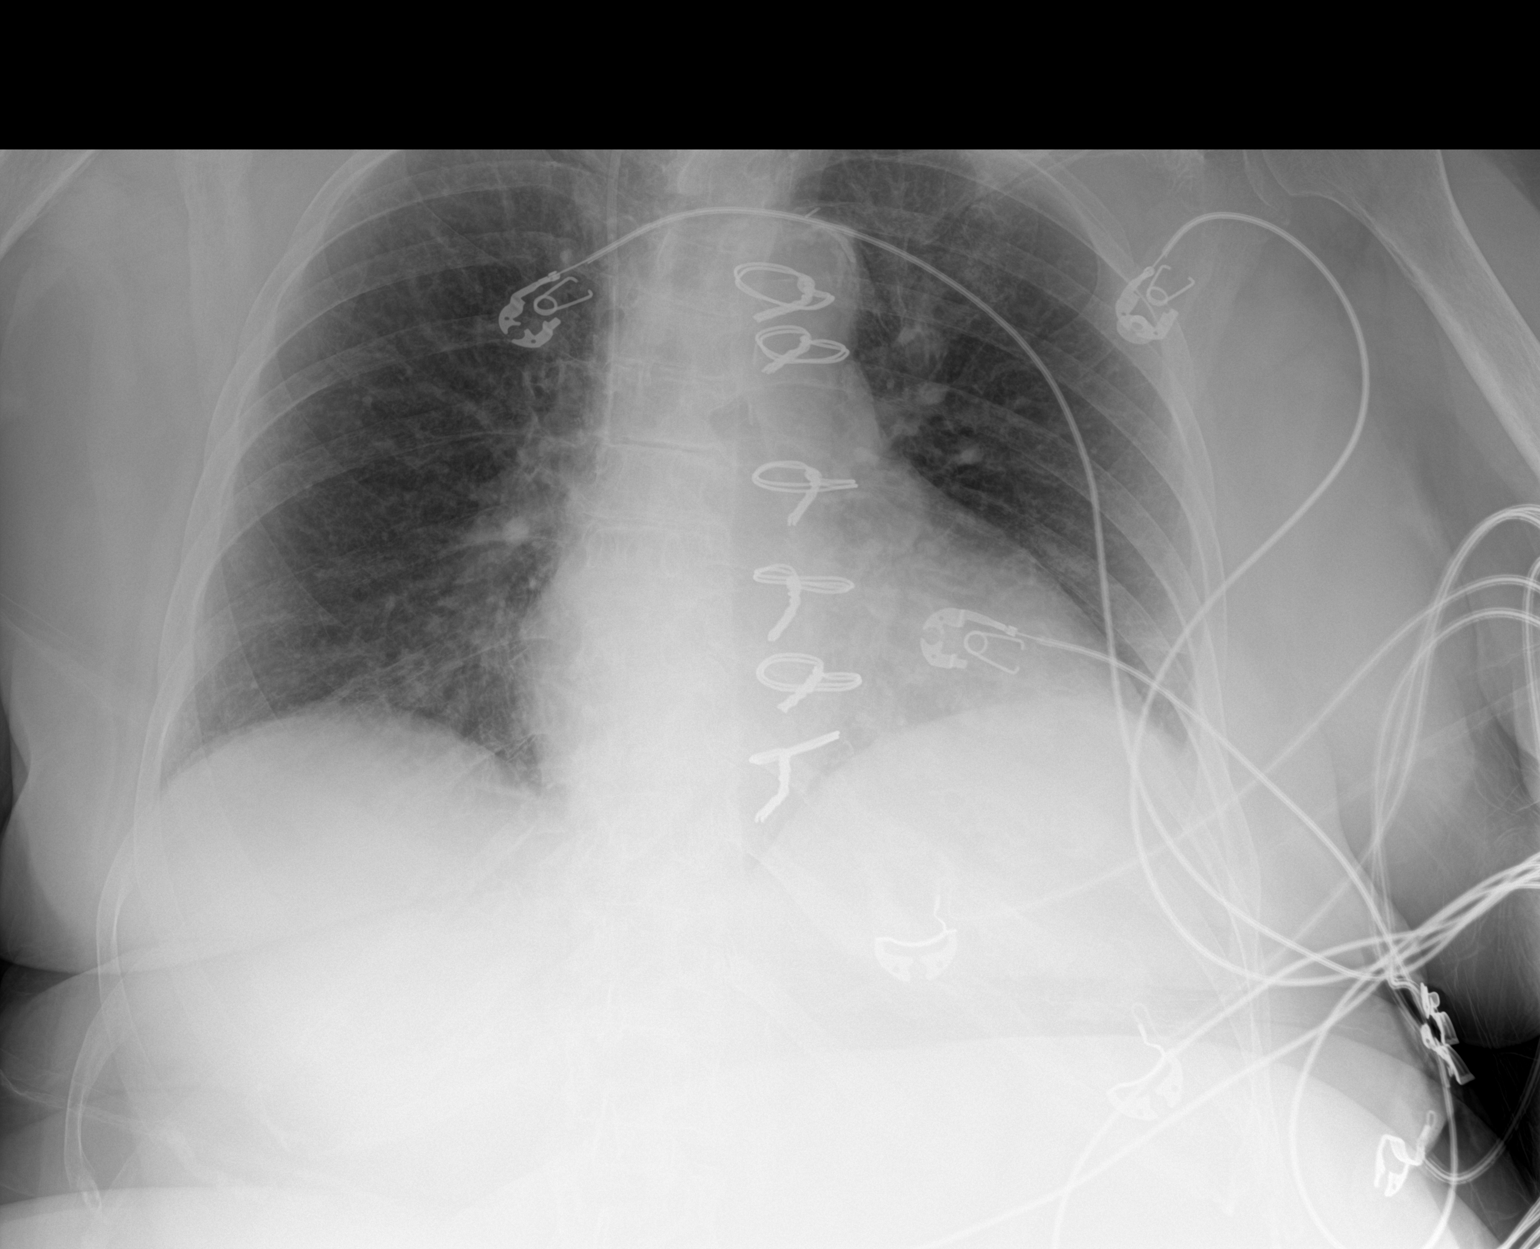

[1 of 1 positions shown; findings below may reference images not displayed]

FINDINGS: Mild cardiomegaly is stable. Median sternotomy wires appear intact
and stable in alignment. Right IJ central line has been placed with
tip adequately positioned at the level of the mid SVC. No
pneumothorax. Lungs are clear.
IMPRESSION: Right IJ central line placement with tip adequately positioned at
the level of the mid SVC. No pneumothorax or other procedural
complicating feature.

## 2017-06-12 IMAGING — CR DG RIBS W/ CHEST 3+V*R*
5 series · 5 of 5 positions shown · non-contrast
Comparison: January 08, 2005.

CLINICAL DATA: Right rib pain after fall last night at home.

EXAM:
RIGHT RIBS AND CHEST - 3+ VIEW

[t ribs rpo right (1 of 3)]
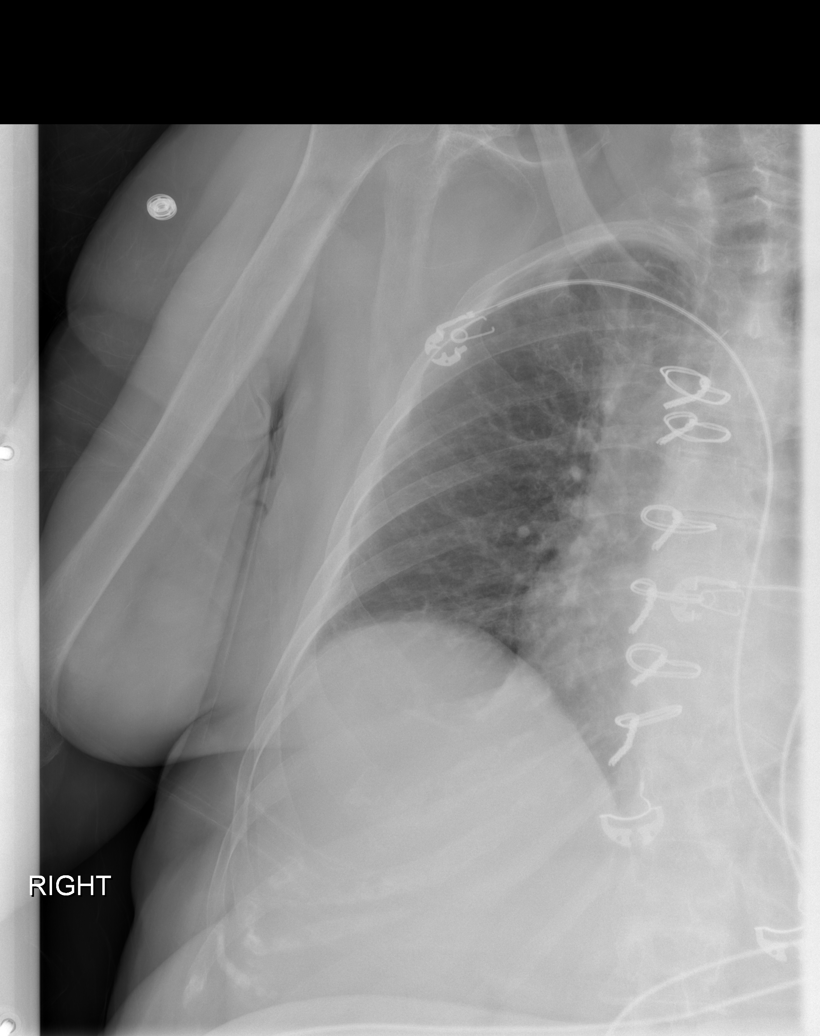

[t ribs rpo right (2 of 3)]
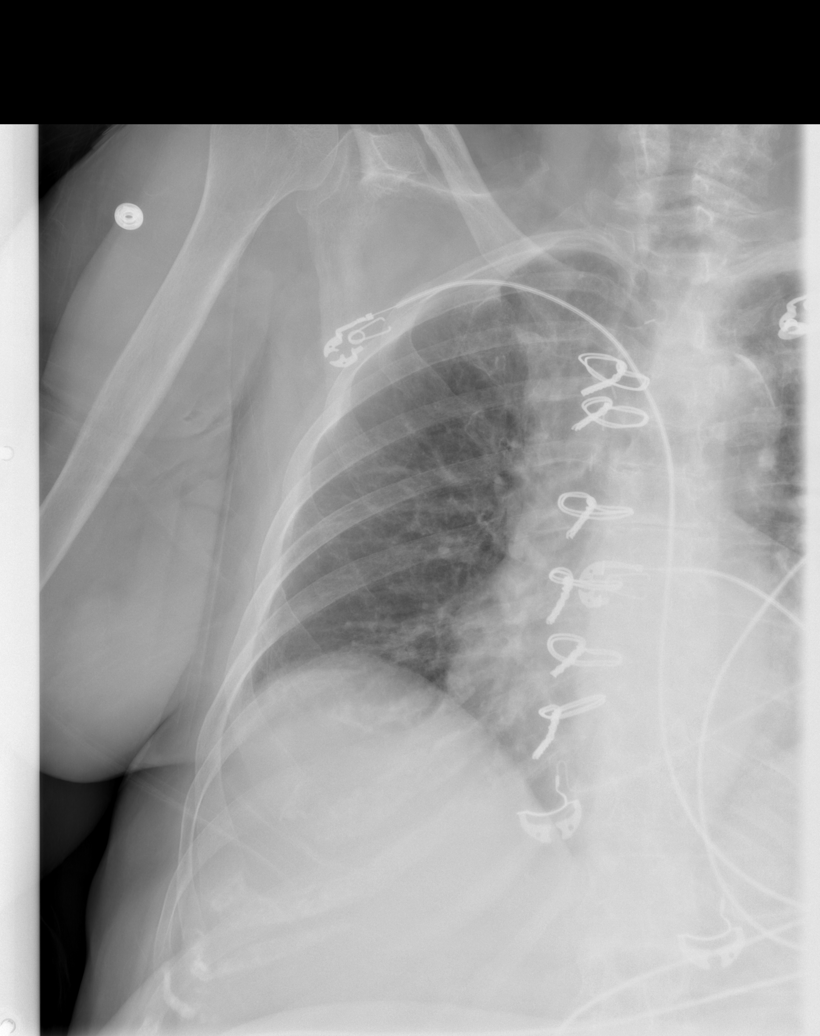

[t ribs rpo right (3 of 3)]
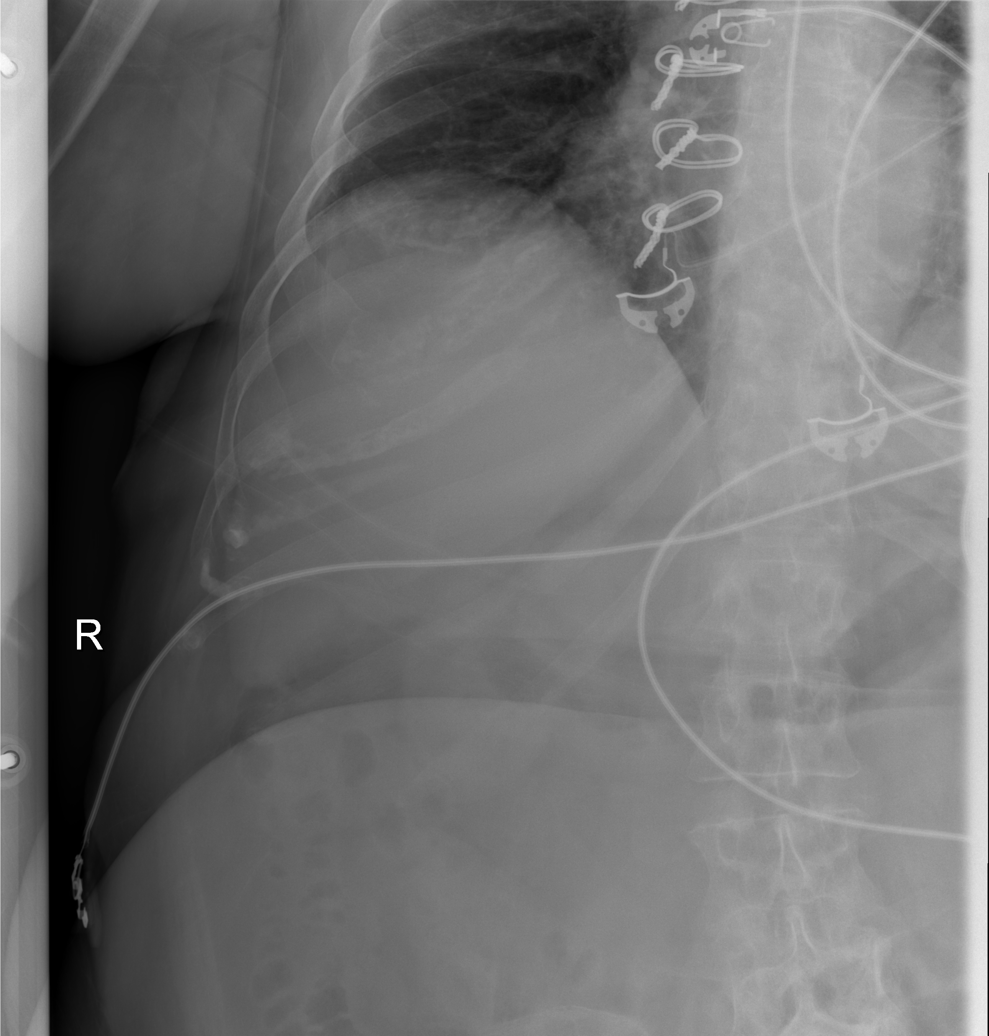

[t ribs ap lower right]
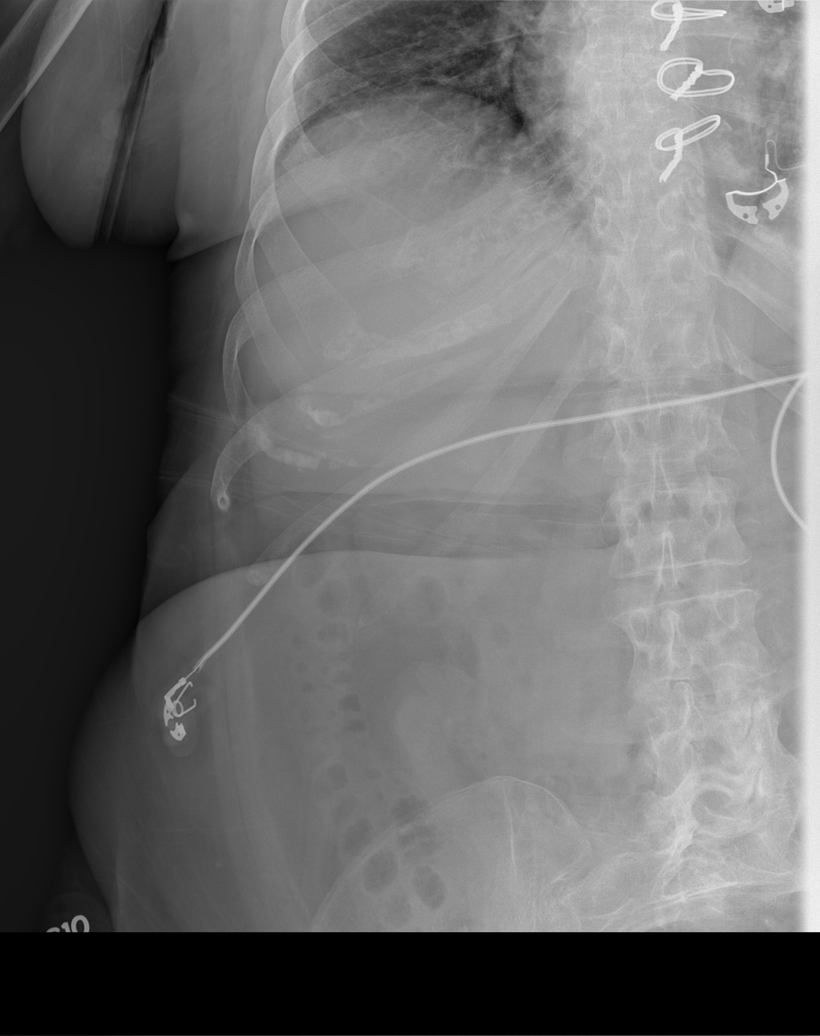

[t chest supine]
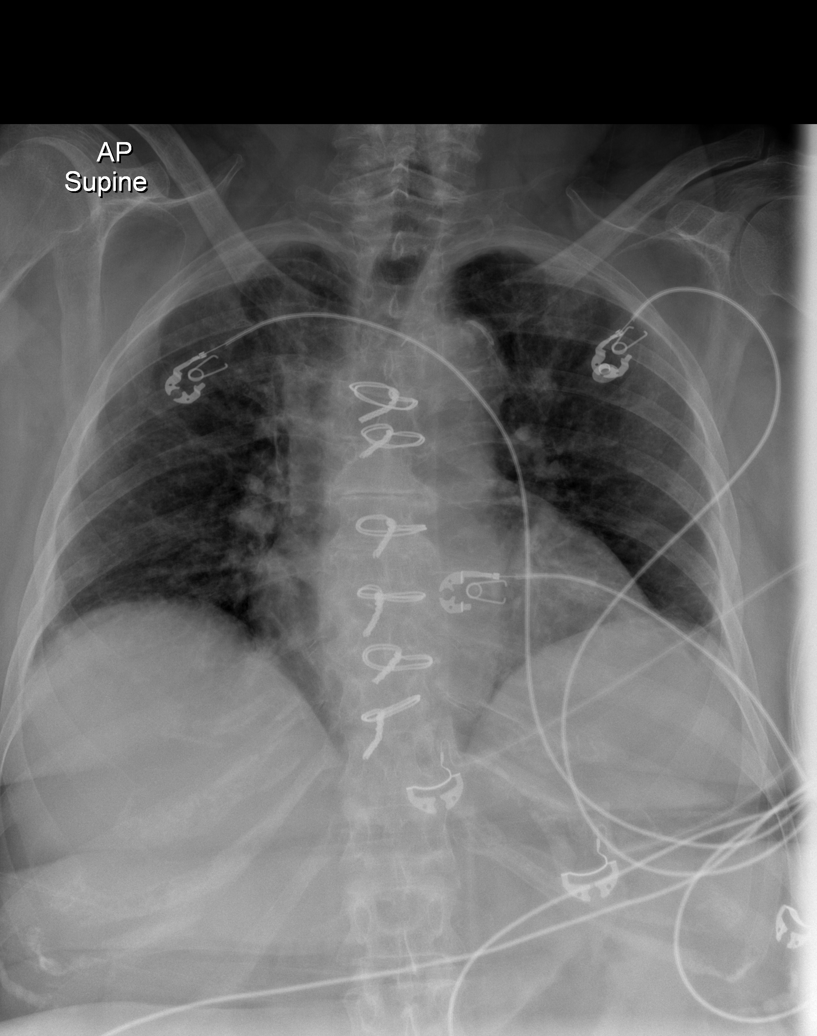

[5 of 5 positions shown; findings below may reference images not displayed]

FINDINGS: No fracture or other bone lesions are seen involving the ribs. There
is no evidence of pneumothorax or pleural effusion. Both lungs are
clear. Heart size and mediastinal contours are within normal limits.
IMPRESSION: Normal right ribs.  No acute cardiopulmonary abnormality seen.
